# Patient Record
Sex: Female | Born: 1955 | Race: White | Hispanic: No | Marital: Married | Smoking: Former smoker
Health system: Southern US, Community
[De-identification: ages and names within clinical notes are randomized; demographics above are authoritative.]

## PROBLEM LIST (undated history)

## (undated) DIAGNOSIS — Z87442 Personal history of urinary calculi: Secondary | ICD-10-CM

## (undated) DIAGNOSIS — I499 Cardiac arrhythmia, unspecified: Secondary | ICD-10-CM

## (undated) DIAGNOSIS — I10 Essential (primary) hypertension: Secondary | ICD-10-CM

## (undated) DIAGNOSIS — K219 Gastro-esophageal reflux disease without esophagitis: Secondary | ICD-10-CM

## (undated) DIAGNOSIS — M1990 Unspecified osteoarthritis, unspecified site: Secondary | ICD-10-CM

## (undated) DIAGNOSIS — R519 Headache, unspecified: Secondary | ICD-10-CM

## (undated) DIAGNOSIS — R51 Headache: Secondary | ICD-10-CM

## (undated) DIAGNOSIS — R002 Palpitations: Secondary | ICD-10-CM

## (undated) DIAGNOSIS — F32A Depression, unspecified: Secondary | ICD-10-CM

## (undated) DIAGNOSIS — F329 Major depressive disorder, single episode, unspecified: Secondary | ICD-10-CM

## (undated) DIAGNOSIS — E119 Type 2 diabetes mellitus without complications: Secondary | ICD-10-CM

## (undated) HISTORY — PX: OTHER SURGICAL HISTORY: SHX169

---

## 2003-09-26 HISTORY — PX: FOOT SURGERY: SHX648

## 2005-06-26 ENCOUNTER — Ambulatory Visit: Payer: Self-pay | Admitting: Internal Medicine

## 2006-08-02 ENCOUNTER — Ambulatory Visit: Payer: Self-pay | Admitting: Internal Medicine

## 2007-08-13 ENCOUNTER — Ambulatory Visit: Payer: Self-pay | Admitting: Family Medicine

## 2008-02-03 ENCOUNTER — Ambulatory Visit: Payer: Self-pay | Admitting: Internal Medicine

## 2008-08-13 ENCOUNTER — Ambulatory Visit: Payer: Self-pay | Admitting: Internal Medicine

## 2009-08-16 ENCOUNTER — Ambulatory Visit: Payer: Self-pay | Admitting: Internal Medicine

## 2010-04-18 ENCOUNTER — Ambulatory Visit: Payer: Self-pay | Admitting: Obstetrics & Gynecology

## 2010-04-21 ENCOUNTER — Ambulatory Visit: Payer: Self-pay | Admitting: Obstetrics & Gynecology

## 2010-04-22 LAB — PATHOLOGY REPORT

## 2011-05-22 ENCOUNTER — Emergency Department: Payer: Self-pay | Admitting: Emergency Medicine

## 2011-07-06 ENCOUNTER — Ambulatory Visit: Payer: Self-pay | Admitting: Obstetrics & Gynecology

## 2011-11-07 ENCOUNTER — Ambulatory Visit: Payer: Self-pay

## 2011-12-25 ENCOUNTER — Emergency Department: Payer: Self-pay | Admitting: Emergency Medicine

## 2012-01-03 ENCOUNTER — Ambulatory Visit: Payer: Self-pay | Admitting: Internal Medicine

## 2012-06-13 ENCOUNTER — Ambulatory Visit: Payer: Self-pay | Admitting: Internal Medicine

## 2012-07-10 ENCOUNTER — Ambulatory Visit: Payer: Self-pay | Admitting: Internal Medicine

## 2013-07-03 ENCOUNTER — Ambulatory Visit: Payer: Self-pay | Admitting: Internal Medicine

## 2014-05-26 ENCOUNTER — Ambulatory Visit: Payer: Self-pay

## 2014-07-14 ENCOUNTER — Ambulatory Visit: Payer: Self-pay | Admitting: Internal Medicine

## 2014-07-23 ENCOUNTER — Emergency Department: Payer: Self-pay | Admitting: Emergency Medicine

## 2014-07-23 LAB — CBC
HCT: 38.8 % (ref 35.0–47.0)
HGB: 12.8 g/dL (ref 12.0–16.0)
MCH: 30.1 pg (ref 26.0–34.0)
MCHC: 33 g/dL (ref 32.0–36.0)
MCV: 91 fL (ref 80–100)
Platelet: 190 x10 3/mm 3 (ref 150–440)
RBC: 4.25 X10 6/mm 3 (ref 3.80–5.20)
RDW: 12.6 % (ref 11.5–14.5)
WBC: 8.2 x10 3/mm 3 (ref 3.6–11.0)

## 2014-07-23 LAB — COMPREHENSIVE METABOLIC PANEL WITH GFR
Albumin: 3.6 g/dL (ref 3.4–5.0)
Alkaline Phosphatase: 98 U/L
Anion Gap: 11 (ref 7–16)
BUN: 19 mg/dL — ABNORMAL HIGH (ref 7–18)
Bilirubin,Total: 0.4 mg/dL (ref 0.2–1.0)
Calcium, Total: 9 mg/dL (ref 8.5–10.1)
Chloride: 103 mmol/L (ref 98–107)
Co2: 28 mmol/L (ref 21–32)
Creatinine: 0.84 mg/dL (ref 0.60–1.30)
EGFR (African American): >60
EGFR (Non-African Amer.): >60
Glucose: 262 mg/dL — ABNORMAL HIGH (ref 65–99)
Osmolality: 294 (ref 275–301)
Potassium: 3.9 mmol/L (ref 3.5–5.1)
SGOT(AST): 89 U/L — ABNORMAL HIGH (ref 15–37)
SGPT (ALT): 84 U/L — ABNORMAL HIGH
Sodium: 142 mmol/L (ref 136–145)
Total Protein: 7.7 g/dL (ref 6.4–8.2)

## 2014-07-23 LAB — URINALYSIS, COMPLETE
Bacteria: NONE SEEN
Bilirubin,UR: NEGATIVE
Glucose,UR: >=500 mg/dL (ref 0–75)
Hyaline Cast: 2
Ketone: NEGATIVE
Nitrite: NEGATIVE
Ph: 5 (ref 4.5–8.0)
Protein: 100
RBC,UR: 449 /HPF (ref 0–5)
Specific Gravity: 1.021 (ref 1.003–1.030)
Squamous Epithelial: 2
WBC UR: 144 /HPF (ref 0–5)

## 2014-07-25 LAB — URINE CULTURE

## 2014-11-02 ENCOUNTER — Emergency Department: Payer: Self-pay | Admitting: Emergency Medicine

## 2015-02-01 DIAGNOSIS — R748 Abnormal levels of other serum enzymes: Secondary | ICD-10-CM | POA: Insufficient documentation

## 2015-02-24 HISTORY — PX: COLONOSCOPY: SHX174

## 2015-03-08 ENCOUNTER — Ambulatory Visit: Payer: BLUE CROSS/BLUE SHIELD | Admitting: Anesthesiology

## 2015-03-08 ENCOUNTER — Encounter
Admission: RE | Disposition: A | Discharge status: Home / Self Care | Payer: Self-pay | Source: Ambulatory Visit | Attending: Gastroenterology

## 2015-03-08 ENCOUNTER — Ambulatory Visit
Admission: RE | Admit: 2015-03-08 | Discharge: 2015-03-08 | Disposition: A | Discharge status: Home / Self Care | Payer: BLUE CROSS/BLUE SHIELD | Source: Ambulatory Visit | Attending: Gastroenterology | Admitting: Gastroenterology

## 2015-03-08 DIAGNOSIS — I10 Essential (primary) hypertension: Secondary | ICD-10-CM | POA: Insufficient documentation

## 2015-03-08 DIAGNOSIS — Z1211 Encounter for screening for malignant neoplasm of colon: Secondary | ICD-10-CM | POA: Diagnosis present

## 2015-03-08 DIAGNOSIS — E119 Type 2 diabetes mellitus without complications: Secondary | ICD-10-CM | POA: Diagnosis not present

## 2015-03-08 DIAGNOSIS — Z79899 Other long term (current) drug therapy: Secondary | ICD-10-CM | POA: Diagnosis not present

## 2015-03-08 DIAGNOSIS — F329 Major depressive disorder, single episode, unspecified: Secondary | ICD-10-CM | POA: Diagnosis not present

## 2015-03-08 DIAGNOSIS — R51 Headache: Secondary | ICD-10-CM | POA: Diagnosis not present

## 2015-03-08 DIAGNOSIS — Z87891 Personal history of nicotine dependence: Secondary | ICD-10-CM | POA: Diagnosis not present

## 2015-03-08 DIAGNOSIS — Z7982 Long term (current) use of aspirin: Secondary | ICD-10-CM | POA: Insufficient documentation

## 2015-03-08 HISTORY — DX: Headache, unspecified: R51.9

## 2015-03-08 HISTORY — DX: Depression, unspecified: F32.A

## 2015-03-08 HISTORY — DX: Type 2 diabetes mellitus without complications: E11.9

## 2015-03-08 HISTORY — DX: Headache: R51

## 2015-03-08 HISTORY — PX: COLONOSCOPY: SHX5424

## 2015-03-08 HISTORY — DX: Essential (primary) hypertension: I10

## 2015-03-08 HISTORY — DX: Major depressive disorder, single episode, unspecified: F32.9

## 2015-03-08 SURGERY — COLONOSCOPY
Anesthesia: General

## 2015-03-08 MED ORDER — MIDAZOLAM HCL 2 MG/2ML IJ SOLN
INTRAMUSCULAR | Status: DC | PRN
  Administered 2015-03-08: 2 mg via INTRAVENOUS

## 2015-03-08 MED ORDER — SODIUM CHLORIDE 0.9 % IV SOLN
INTRAVENOUS | Status: DC
  Administered 2015-03-08: 1000 mL via INTRAVENOUS

## 2015-03-08 MED ORDER — PROPOFOL INFUSION 10 MG/ML OPTIME
INTRAVENOUS | Status: DC | PRN
  Administered 2015-03-08: 140 ug/kg/min via INTRAVENOUS

## 2015-03-08 MED ORDER — SODIUM CHLORIDE 0.9 % IV SOLN: INTRAVENOUS | Status: DC

## 2015-03-08 MED ORDER — LIDOCAINE HCL (CARDIAC) 20 MG/ML IV SOLN
INTRAVENOUS | Status: DC | PRN
  Administered 2015-03-08: 80 mg via INTRAVENOUS

## 2015-03-08 NOTE — Op Note (Signed)
[facility]astroenterology Patient Name: Belinda Soto Procedure Date: 03/08/2015 10:23 AM MRN: [id number] Account #: [id number] Date of Birth: 06/22/1956 Admit Type: Outpatient Age: 59 Room: [facility] ENDO ROOM 4 Gender: Female Note Status: Finalized Procedure:         Colonoscopy Indications:       Screening for colorectal malignant neoplasm Providers:         Lupita Dawn. Candace Cruise, MD Referring MD:      Lavera Guise, MD (Referring MD) Medicines:         Monitored Anesthesia Care Complications:     No immediate complications. Procedure:         Pre-Anesthesia Assessment:                    - Prior to the procedure, a History and Physical was                     performed, and patient medications, allergies and                     sensitivities were reviewed. The patient's tolerance of                     previous anesthesia was reviewed.                    - The risks and benefits of the procedure and the sedation                     options and risks were discussed with the patient. All                     questions were answered and informed consent was obtained.                    - After reviewing the risks and benefits, the patient was                     deemed in satisfactory condition to undergo the procedure.                    After obtaining informed consent, the colonoscope was                     passed under direct vision. Throughout the procedure, the                     patient's blood pressure, pulse, and oxygen saturations                     were monitored continuously. The Colonoscope was                     introduced through the anus and advanced to the the cecum,                     identified by appendiceal orifice and ileocecal valve. The                     colonoscopy was performed without difficulty. The patient                     tolerated the procedure well. The quality of the bowel  preparation was good. Findings:      The  colon (entire examined portion) appeared normal. Impression:        - The entire examined colon is normal.                    - No specimens collected. Recommendation:    - Discharge patient to home.                    - Repeat colonoscopy in 10 years for surveillance.                    - The findings and recommendations were discussed with the                     patient. Procedure Code(s): --- Professional ---                    [id number], Colonoscopy, flexible; diagnostic, including                     collection of specimen(s) by brushing or washing, when                     performed (separate procedure) Diagnosis Code(s): --- Professional ---                    Z12.11, Encounter for screening for malignant neoplasm of                     colon CPT copyright 2014 American [employer]. All rights reserved. The codes documented in this report are preliminary and upon coder review may  be revised to meet current compliance requirements. Hulen Luster, MD 03/08/2015 10:51:29 AM This report has been signed electronically. Number of Addenda: 0 Note Initiated On: 03/08/2015 10:23 AM Scope Withdrawal Time: 0 hours 7 minutes 14 seconds  Total Procedure Duration: 0 hours 10 minutes 55 seconds       [facility]

## 2015-03-08 NOTE — Transfer of Care (Signed)
Immediate Anesthesia Transfer of Care Note  Patient: Belinda Soto  Procedure(s) Performed: Procedure(s): COLONOSCOPY (N/A)  Patient Location: PACU and Endoscopy Unit  Anesthesia Type:General  Level of Consciousness: sedated  Airway & Oxygen Therapy: Patient Spontanous Breathing and Patient connected to nasal cannula oxygen  Post-op Assessment: Report given to RN and Post -op Vital signs reviewed and stable  Post vital signs: Reviewed and stable  Last Vitals:  Filed Vitals:   03/08/15 1050  BP: 102/49  Pulse: 75  Temp: 35.6 C  Resp: 15    Complications: No apparent anesthesia complications

## 2015-03-08 NOTE — Anesthesia Preprocedure Evaluation (Signed)
Anesthesia Evaluation  Patient identified by MRN, date of birth, ID band Patient awake    Reviewed: Allergy & Precautions, H&P , NPO status , Patient's Chart, lab work & pertinent test results, reviewed documented beta blocker date and time   Airway Mallampati: II  TM Distance: >3 FB Neck ROM: full    Dental no notable dental hx.    Pulmonary neg pulmonary ROS, former smoker,  breath sounds clear to auscultation  Pulmonary exam normal       Cardiovascular Exercise Tolerance: Good hypertension, negative cardio ROS  Rhythm:regular Rate:Normal     Neuro/Psych  Headaches, negative neurological ROS  negative psych ROS   GI/Hepatic negative GI ROS, Neg liver ROS,   Endo/Other  negative endocrine ROSdiabetes  Renal/GU negative Renal ROS  negative genitourinary   Musculoskeletal   Abdominal   Peds  Hematology negative hematology ROS (+)   Anesthesia Other Findings   Reproductive/Obstetrics negative OB ROS                             Anesthesia Physical Anesthesia Plan  ASA: II  Anesthesia Plan: General   Post-op Pain Management:    Induction:   Airway Management Planned:   Additional Equipment:   Intra-op Plan:   Post-operative Plan:   Informed Consent: I have reviewed the patients History and Physical, chart, labs and discussed the procedure including the risks, benefits and alternatives for the proposed anesthesia with the patient or authorized representative who has indicated his/her understanding and acceptance.   Dental Advisory Given  Plan Discussed with: CRNA  Anesthesia Plan Comments:         Anesthesia Quick Evaluation

## 2015-03-08 NOTE — Anesthesia Postprocedure Evaluation (Signed)
  Anesthesia Post-op Note  Patient: Belinda Soto  Procedure(s) Performed: Procedure(s): COLONOSCOPY (N/A)  Anesthesia type:General  Patient location: PACU  Post pain: Pain level controlled  Post assessment: Post-op Vital signs reviewed, Patient's Cardiovascular Status Stable, Respiratory Function Stable, Patent Airway and No signs of Nausea or vomiting  Post vital signs: Reviewed and stable  Last Vitals:  Filed Vitals:   03/08/15 1050  BP: 102/49  Pulse: 75  Temp: 35.6 C  Resp: 15    Level of consciousness: awake, alert  and patient cooperative  Complications: No apparent anesthesia complications

## 2015-03-08 NOTE — H&P (Signed)
    Primary Care Physician:  Lavera Guise, MD Primary Gastroenterologist:  Dr. Candace Cruise  Pre-Procedure History & Physical: HPI:  Belinda Soto is a 59 y.o. female is here for an colonoscopy.   Past Medical History  Diagnosis Date  . Hypertension   . Diabetes mellitus without complication   . Headache   . Depression     Past Surgical History  Procedure Laterality Date  . C sections      Prior to Admission medications   Medication Sig Start Date End Date Taking? Authorizing Provider  aspirin 81 MG tablet Take 81 mg by mouth daily.   Yes Historical Provider, MD  bisoprolol-hydrochlorothiazide (ZIAC) 5-6.25 MG per tablet Take 1 tablet by mouth daily.   Yes Historical Provider, MD  celecoxib (CELEBREX) 200 MG capsule Take 200 mg by mouth daily.   Yes Historical Provider, MD  citalopram (CELEXA) 20 MG tablet Take 20 mg by mouth daily.   Yes Historical Provider, MD  ferrous sulfate 325 (65 FE) MG tablet Take 325 mg by mouth daily with breakfast.   Yes Historical Provider, MD  gabapentin (NEURONTIN) 100 MG capsule Take 100 mg by mouth 3 (three) times daily as needed (pain).   Yes Historical Provider, MD  glimepiride (AMARYL) 2 MG tablet Take 4 mg by mouth 2 (two) times daily.   Yes Historical Provider, MD  hydrochlorothiazide (MICROZIDE) 12.5 MG capsule Take 12.5 mg by mouth daily.   Yes Historical Provider, MD  Liraglutide (VICTOZA) 18 MG/3ML SOPN Inject 18 mg into the skin daily.   Yes Historical Provider, MD  metFORMIN (GLUCOPHAGE) 500 MG tablet Take 1,000 mg by mouth 2 (two) times daily.   Yes Historical Provider, MD  valsartan (DIOVAN) 320 MG tablet Take 320 mg by mouth daily.   Yes Historical Provider, MD    Allergies as of 02/02/2015  . (Not on File)    History reviewed. No pertinent family history.  History   Social History  . Marital Status: Married    Spouse Name: N/A  . Number of Children: N/A  . Years of Education: N/A   Occupational History  . Not on file.    Social History Main Topics  . Smoking status: Former Research scientist (life sciences)  . Smokeless tobacco: Not on file  . Alcohol Use: Not on file  . Drug Use: Not on file  . Sexual Activity: Not on file   Other Topics Concern  . Not on file   Social History Narrative    Review of Systems: See HPI, otherwise negative ROS  Physical Exam: There were no vitals taken for this visit. General:   Alert,  pleasant and cooperative in NAD Head:  Normocephalic and atraumatic. Neck:  Supple; no masses or thyromegaly. Lungs:  Clear throughout to auscultation.    Heart:  Regular rate and rhythm. Abdomen:  Soft, nontender and nondistended. Normal bowel sounds, without guarding, and without rebound.   Neurologic:  Alert and  oriented x4;  grossly normal neurologically.  Impression/Plan: Belinda Soto is here for a colonoscopy to be performed for screening.  Risks, benefits, limitations, and alternatives regarding colonoscopy have been reviewed with the patient.  Questions have been answered.  All parties agreeable.   Keilyn Haggard, Lupita Dawn, MD  03/08/2015, 9:49 AM

## 2015-04-29 ENCOUNTER — Encounter: Payer: Self-pay | Admitting: Gastroenterology

## 2015-07-22 ENCOUNTER — Other Ambulatory Visit: Payer: Self-pay | Admitting: Internal Medicine

## 2015-07-22 DIAGNOSIS — Z1231 Encounter for screening mammogram for malignant neoplasm of breast: Secondary | ICD-10-CM

## 2015-07-27 ENCOUNTER — Ambulatory Visit
Admission: RE | Admit: 2015-07-27 | Discharge: 2015-07-27 | Disposition: A | Discharge status: Home / Self Care | Payer: BLUE CROSS/BLUE SHIELD | Source: Ambulatory Visit | Attending: Internal Medicine | Admitting: Internal Medicine

## 2015-07-27 DIAGNOSIS — Z1231 Encounter for screening mammogram for malignant neoplasm of breast: Secondary | ICD-10-CM | POA: Diagnosis not present

## 2015-12-09 ENCOUNTER — Ambulatory Visit (INDEPENDENT_AMBULATORY_CARE_PROVIDER_SITE_OTHER): Payer: BLUE CROSS/BLUE SHIELD | Admitting: Internal Medicine

## 2015-12-09 ENCOUNTER — Encounter: Payer: Self-pay | Admitting: Internal Medicine

## 2015-12-09 VITALS — BP 138/90 | HR 73 | Ht 65.0 in | Wt 167.4 lb

## 2015-12-09 DIAGNOSIS — I998 Other disorder of circulatory system: Secondary | ICD-10-CM | POA: Diagnosis not present

## 2015-12-09 MED ORDER — METOPROLOL TARTRATE 50 MG PO TABS: 50.0000 mg | ORAL_TABLET | Freq: Two times a day (BID) | ORAL | Status: DC

## 2015-12-09 NOTE — Patient Instructions (Signed)
Medication Instructions:  Your physician has recommended you make the following change in your medication:  INCREASE metoprolol to 50mg  twice daily    Labwork: none  Testing/Procedures: none  Follow-Up: Your physician recommends that you schedule a follow-up appointment as needed   Any Other Special Instructions Will Be Listed Below (If Applicable).     If you need a refill on your cardiac medications before your next appointment, please call your pharmacy.

## 2015-12-09 NOTE — Progress Notes (Signed)
ELECTROPHYSIOLOGY CONSULT NOTE  Patient ID: Belinda Soto, MRN: [id number], DOB/AGE: [birth year] 58 y.o. Admit date: (Not on file) Date of Consult: 12/09/2015  Primary Physician: Lavera Guise, MD Primary Cardiologist: new Consulting Physician FK  Chief Complaint: Palpitations   HPI Belinda Soto is a 60 y.o. female  Referred for palpitations. This has been going on 2-3 years but has been more problematic over the last 2-3 months.  They're characterized as "skips" or "extra beats." Worsening over the last couple of months derive from increasing frequency of these events. She often feels them in her throat. There is some lightheadedness. She has not noted problems with exercise tolerance or peripheral edema.  Reports included in the referral packaging included an echo with normal LV function some diastolic dysfunction and mild left atrial enlargement. In this context it is notable that she has significant greater than 3 drug hypertension she has daytime somnolence although she denies sleep apnea  Her blood pressure medications were changed; palpitations have been somewhat improved. I assume that the change was the addition of the bisoprolol.  She underwent stress testing, there is a written comment regarding extra beats; strips Were not obtained  She also had a Holter monitor which demonstrated PACs and nonsustained atrial tachycardia up to 5 beats. These comprise about 2% of her total beats; no PVCs were noted  She denies syncope  She does not use caffeine  She has diabetes; her last hemoglobin A1c was greater than 7. Laboratories were also notable for hypertension hyper transaminitis anemia      Past Medical History  Diagnosis Date  . Hypertension   . Diabetes mellitus without complication ([place])   . Headache   . Depression       Surgical History:  Past Surgical History  Procedure Laterality Date  . C sections    . Colonoscopy N/A 03/08/2015    Procedure:  COLONOSCOPY;  Surgeon: Hulen Luster, MD;  Location: [facility] ENDOSCOPY;  Service: Gastroenterology;  Laterality: N/A;     Home Meds: Prior to Admission medications   Medication Sig Start Date End Date Taking? Authorizing Provider  aspirin 81 MG tablet Take 81 mg by mouth daily.   Yes Historical Provider, MD  bisoprolol-hydrochlorothiazide (ZIAC) 5-6.25 MG per tablet Take 1 tablet by mouth daily.   Yes Historical Provider, MD  celecoxib (CELEBREX) 200 MG capsule Take 200 mg by mouth daily.   Yes Historical Provider, MD  citalopram (CELEXA) 20 MG tablet Take 20 mg by mouth daily.   Yes Historical Provider, MD  ferrous sulfate 325 (65 FE) MG tablet Take 325 mg by mouth daily with breakfast.   Yes Historical Provider, MD  gabapentin (NEURONTIN) 100 MG capsule Take 100 mg by mouth 3 (three) times daily as needed (pain).   Yes Historical Provider, MD  glimepiride (AMARYL) 2 MG tablet Take 4 mg by mouth 2 (two) times daily.   Yes Historical Provider, MD  hydrochlorothiazide (MICROZIDE) 12.5 MG capsule Take 12.5 mg by mouth daily.   Yes Historical Provider, MD  Liraglutide (VICTOZA) 18 MG/3ML SOPN Inject 18 mg into the skin daily.   Yes Historical Provider, MD  metFORMIN (GLUCOPHAGE) 500 MG tablet Take 1,000 mg by mouth 2 (two) times daily.   Yes Historical Provider, MD  metoprolol tartrate (LOPRESSOR) 25 MG tablet Take 25 mg by mouth 2 (two) times daily. 12/09/15  Yes Historical Provider, MD  valsartan (DIOVAN) 320 MG tablet Take 320 mg by mouth daily.  Yes Historical Provider, MD    Allergies:  Allergies  Allergen Reactions  . Codeine Nausea And Vomiting  . Morphine And Related Itching  . Sulfur Swelling    Social History   Social History  . Marital Status: Married    Spouse Name: N/A  . Number of Children: N/A  . Years of Education: N/A   Occupational History  . Not on file.   Social History Main Topics  . Smoking status: Former Research scientist (life sciences)  . Smokeless tobacco: Not on file  . Alcohol Use:  Not on file  . Drug Use: Not on file  . Sexual Activity: Not on file   Other Topics Concern  . Not on file   Social History Narrative     Family History  Problem Relation Age of Onset  . Breast cancer Neg Hx      ROS:  Please see the history of present illness.     All other systems reviewed and negative.    Physical Exam:   Blood pressure 138/90, pulse 73, height 5\' 5"  (1.651 m), weight 167 lb 6.4 oz (75.932 kg). General: Well developed, well nourished female in no acute distress. Head: Normocephalic, atraumatic, sclera non-icteric, no xanthomas, nares are without discharge. EENT: normal  Lymph Nodes:  none Neck: Negative for carotid bruits. JVD not elevated. Back:without scoliosis kyphosis  Lungs: Clear bilaterally to auscultation without wheezes, rales, or rhonchi. Breathing is unlabored. Heart: RRR with S1 S2. No  murmur . No rubs, or gallops appreciated. Abdomen: Soft, non-tender, non-distended with normoactive bowel sounds. No hepatomegaly. No rebound/guarding. No obvious abdominal masses. Msk:  Strength and tone appear normal for age. Extremities: No clubbing or cyanosis. No edema.  Distal pedal pulses are 2+ and equal bilaterally. Skin: Warm and Dry Neuro: Alert and oriented X 3. CN III-XII intact Grossly normal sensory and motor function . Psych:  Responds to questions appropriately with a normal affect.      Labs: Cardiac Enzymes No results for input(s): CKTOTAL, CKMB, TROPONINI in the last 72 hours. CBC Lab Results  Component Value Date   WBC 8.2 07/23/2014   HGB 12.8 07/23/2014   HCT 38.8 07/23/2014   MCV 91 07/23/2014   PLT 190 07/23/2014   PROTIME: No results for input(s): LABPROT, INR in the last 72 hours. Chemistry No results for input(s): NA, K, CL, CO2, BUN, CREATININE, CALCIUM, PROT, BILITOT, ALKPHOS, ALT, AST, GLUCOSE in the last 168 hours.  Invalid input(s): LABALBU Lipids No results found for: CHOL, HDL, LDLCALC, TRIG BNP No results found  for: PROBNP Thyroid Function Tests: No results for input(s): TSH, T4TOTAL, T3FREE, THYROIDAB in the last 72 hours.  Invalid input(s): FREET3 Miscellaneous No results found for: DDIMER  Radiology/Studies:  No results found.  EKG: sinus   Holter monitor was obtained. As above  Assessment and Plan:  Palpitations-PACs and nonsustained atrial tachycardia  Hypertension  Diabetes  Sleep disordered breathing   The patient has symptomatic PACs. I have referred her that these are benign. In the context of her hypertension and her mild left atrial enlargement I'm concerned about the potential or atrial fibrillation and I have encouraged her to be aggressive with her blood pressure management and also have explained the relationship of sleep apnea and atrial fibrillation. With her daytime somnolence and her hypertension I have encouraged her to follow up with Dr. Humphrey Rolls concerning a heart sleep study  We will increase her metoprolol from 25 twice a day--50 twice a day.  Given the ACCORD  trial, target blood pressures would be in the 130 range>> UTDate ---based upon data from goal blood pressure trials in diabetic patients, plus indirect data from SPRINT (that included patients who, like those with diabetes, have a high cardiovascular risk) [39], we suggest a goal systolic pressure of 123456 to 125 mmHg in patients with diabetes if AOBP is used to measure blood pressure, or a systolic pressure of 0000000 to 130 mmHg if manual ausculatory blood pressure is used, rather than a goal systolic pressure of less than 140 mmHg  In this regard I thought that chlorthalidone (preferred to hydrochlorothiazide) might be better since her blood pressures of been elevated since the discontinuation of the diuretic      Virl Axe

## 2016-01-13 ENCOUNTER — Emergency Department: Payer: Worker's Compensation

## 2016-01-13 ENCOUNTER — Emergency Department
Admission: EM | Admit: 2016-01-13 | Discharge: 2016-01-13 | Disposition: A | Discharge status: Home / Self Care | Payer: Worker's Compensation | Attending: Emergency Medicine | Admitting: Emergency Medicine

## 2016-01-13 ENCOUNTER — Encounter: Payer: Self-pay | Admitting: Emergency Medicine

## 2016-01-13 DIAGNOSIS — F329 Major depressive disorder, single episode, unspecified: Secondary | ICD-10-CM | POA: Diagnosis not present

## 2016-01-13 DIAGNOSIS — Z7984 Long term (current) use of oral hypoglycemic drugs: Secondary | ICD-10-CM | POA: Diagnosis not present

## 2016-01-13 DIAGNOSIS — I10 Essential (primary) hypertension: Secondary | ICD-10-CM | POA: Insufficient documentation

## 2016-01-13 DIAGNOSIS — Z79899 Other long term (current) drug therapy: Secondary | ICD-10-CM | POA: Diagnosis not present

## 2016-01-13 DIAGNOSIS — Y939 Activity, unspecified: Secondary | ICD-10-CM | POA: Diagnosis not present

## 2016-01-13 DIAGNOSIS — E119 Type 2 diabetes mellitus without complications: Secondary | ICD-10-CM | POA: Diagnosis not present

## 2016-01-13 DIAGNOSIS — S0990XA Unspecified injury of head, initial encounter: Secondary | ICD-10-CM

## 2016-01-13 DIAGNOSIS — S01511A Laceration without foreign body of lip, initial encounter: Secondary | ICD-10-CM | POA: Diagnosis not present

## 2016-01-13 DIAGNOSIS — R42 Dizziness and giddiness: Secondary | ICD-10-CM | POA: Diagnosis not present

## 2016-01-13 DIAGNOSIS — M542 Cervicalgia: Secondary | ICD-10-CM | POA: Insufficient documentation

## 2016-01-13 DIAGNOSIS — W208XXA Other cause of strike by thrown, projected or falling object, initial encounter: Secondary | ICD-10-CM | POA: Diagnosis not present

## 2016-01-13 DIAGNOSIS — Z7982 Long term (current) use of aspirin: Secondary | ICD-10-CM | POA: Insufficient documentation

## 2016-01-13 DIAGNOSIS — M545 Low back pain: Secondary | ICD-10-CM | POA: Diagnosis not present

## 2016-01-13 DIAGNOSIS — Y999 Unspecified external cause status: Secondary | ICD-10-CM | POA: Diagnosis not present

## 2016-01-13 DIAGNOSIS — R51 Headache: Secondary | ICD-10-CM | POA: Insufficient documentation

## 2016-01-13 DIAGNOSIS — Z87891 Personal history of nicotine dependence: Secondary | ICD-10-CM | POA: Insufficient documentation

## 2016-01-13 DIAGNOSIS — Y929 Unspecified place or not applicable: Secondary | ICD-10-CM | POA: Insufficient documentation

## 2016-01-13 MED ORDER — MELOXICAM 15 MG PO TABS: 15.0000 mg | ORAL_TABLET | Freq: Every day | ORAL | Status: DC

## 2016-01-13 MED ORDER — BACLOFEN 10 MG PO TABS: 10.0000 mg | ORAL_TABLET | Freq: Three times a day (TID) | ORAL | Status: DC

## 2016-01-13 NOTE — Discharge Instructions (Signed)
Concussion, Adult  A concussion, or closed-head injury, is a brain injury caused by a direct blow to the head or by a quick and sudden movement (jolt) of the head or neck. Concussions are usually not life-threatening. Even so, the effects of a concussion can be serious. If you have had a concussion before, you are more likely to experience concussion-like symptoms after a direct blow to the head.   CAUSES  · Direct blow to the head, such as from running into another player during a soccer game, being hit in a fight, or hitting your head on a hard surface.  · A jolt of the head or neck that causes the brain to move back and forth inside the skull, such as in a car crash.  SIGNS AND SYMPTOMS  The signs of a concussion can be hard to notice. Early on, they may be missed by you, family members, and health care providers. You may look fine but act or feel differently.  Symptoms are usually temporary, but they may last for days, weeks, or even longer. Some symptoms may appear right away while others may not show up for hours or days. Every head injury is different. Symptoms include:  · Mild to moderate headaches that will not go away.  · A feeling of pressure inside your head.  · Having more trouble than usual:    Learning or remembering things you have heard.    Answering questions.    Paying attention or concentrating.    Organizing daily tasks.    Making decisions and solving problems.  · Slowness in thinking, acting or reacting, speaking, or reading.  · Getting lost or being easily confused.  · Feeling tired all the time or lacking energy (fatigued).  · Feeling drowsy.  · Sleep disturbances.    Sleeping more than usual.    Sleeping less than usual.    Trouble falling asleep.    Trouble sleeping (insomnia).  · Loss of balance or feeling lightheaded or dizzy.  · Nausea or vomiting.  · Numbness or tingling.  · Increased sensitivity to:    Sounds.    Lights.    Distractions.  · Vision problems or eyes that tire  easily.  · Diminished sense of taste or smell.  · Ringing in the ears.  · Mood changes such as feeling sad or anxious.  · Becoming easily irritated or angry for little or no reason.  · Lack of motivation.  · Seeing or hearing things other people do not see or hear (hallucinations).  DIAGNOSIS  Your health care provider can usually diagnose a concussion based on a description of your injury and symptoms. He or she will ask whether you passed out (lost consciousness) and whether you are having trouble remembering events that happened right before and during your injury.  Your evaluation might include:  · A brain scan to look for signs of injury to the brain. Even if the test shows no injury, you may still have a concussion.  · Blood tests to be sure other problems are not present.  TREATMENT  · Concussions are usually treated in an emergency department, in urgent care, or at a clinic. You may need to stay in the hospital overnight for further treatment.  · Tell your health care provider if you are taking any medicines, including prescription medicines, over-the-counter medicines, and natural remedies. Some medicines, such as blood thinners (anticoagulants) and aspirin, may increase the chance of complications. Also tell your health care   provider whether you have had alcohol or are taking illegal drugs. This information may affect treatment.  · Your health care provider will send you home with important instructions to follow.  · How fast you will recover from a concussion depends on many factors. These factors include how severe your concussion is, what part of your brain was injured, your age, and how healthy you were before the concussion.  · Most people with mild injuries recover fully. Recovery can take time. In general, recovery is slower in older persons. Also, persons who have had a concussion in the past or have other medical problems may find that it takes longer to recover from their current injury.  HOME  CARE INSTRUCTIONS  General Instructions  · Carefully follow the directions your health care provider gave you.  · Only take over-the-counter or prescription medicines for pain, discomfort, or fever as directed by your health care provider.  · Take only those medicines that your health care provider has approved.  · Do not drink alcohol until your health care provider says you are well enough to do so. Alcohol and certain other drugs may slow your recovery and can put you at risk of further injury.  · If it is harder than usual to remember things, write them down.  · If you are easily distracted, try to do one thing at a time. For example, do not try to watch TV while fixing dinner.  · Talk with family members or close friends when making important decisions.  · Keep all follow-up appointments. Repeated evaluation of your symptoms is recommended for your recovery.  · Watch your symptoms and tell others to do the same. Complications sometimes occur after a concussion. Older adults with a brain injury may have a higher risk of serious complications, such as a blood clot on the brain.  · Tell your teachers, school nurse, school counselor, coach, athletic trainer, or work manager about your injury, symptoms, and restrictions. Tell them about what you can or cannot do. They should watch for:    Increased problems with attention or concentration.    Increased difficulty remembering or learning new information.    Increased time needed to complete tasks or assignments.    Increased irritability or decreased ability to cope with stress.    Increased symptoms.  · Rest. Rest helps the brain to heal. Make sure you:    Get plenty of sleep at night. Avoid staying up late at night.    Keep the same bedtime hours on weekends and weekdays.    Rest during the day. Take daytime naps or rest breaks when you feel tired.  · Limit activities that require a lot of thought or concentration. These include:    Doing homework or job-related  work.    Watching TV.    Working on the computer.  · Avoid any situation where there is potential for another head injury (football, hockey, soccer, basketball, martial arts, downhill snow sports and horseback riding). Your condition will get worse every time you experience a concussion. You should avoid these activities until you are evaluated by the appropriate follow-up health care providers.  Returning To Your Regular Activities  You will need to return to your normal activities slowly, not all at once. You must give your body and brain enough time for recovery.  · Do not return to sports or other athletic activities until your health care provider tells you it is safe to do so.  · Ask   your health care provider when you can drive, ride a bicycle, or operate heavy machinery. Your ability to react may be slower after a brain injury. Never do these activities if you are dizzy.  · Ask your health care provider about when you can return to work or school.  Preventing Another Concussion  It is very important to avoid another brain injury, especially before you have recovered. In rare cases, another injury can lead to permanent brain damage, brain swelling, or death. The risk of this is greatest during the first 7-10 days after a head injury. Avoid injuries by:  · Wearing a seat belt when riding in a car.  · Drinking alcohol only in moderation.  · Wearing a helmet when biking, skiing, skateboarding, skating, or doing similar activities.  · Avoiding activities that could lead to a second concussion, such as contact or recreational sports, until your health care provider says it is okay.  · Taking safety measures in your home.    Remove clutter and tripping hazards from floors and stairways.    Use grab bars in bathrooms and handrails by stairs.    Place non-slip mats on floors and in bathtubs.    Improve lighting in dim areas.  SEEK MEDICAL CARE IF:  · You have increased problems paying attention or  concentrating.  · You have increased difficulty remembering or learning new information.  · You need more time to complete tasks or assignments than before.  · You have increased irritability or decreased ability to cope with stress.  · You have more symptoms than before.  Seek medical care if you have any of the following symptoms for more than 2 weeks after your injury:  · Lasting (chronic) headaches.  · Dizziness or balance problems.  · Nausea.  · Vision problems.  · Increased sensitivity to noise or light.  · Depression or mood swings.  · Anxiety or irritability.  · Memory problems.  · Difficulty concentrating or paying attention.  · Sleep problems.  · Feeling tired all the time.  SEEK IMMEDIATE MEDICAL CARE IF:  · You have severe or worsening headaches. These may be a sign of a blood clot in the brain.  · You have weakness (even if only in one hand, leg, or part of the face).  · You have numbness.  · You have decreased coordination.  · You vomit repeatedly.  · You have increased sleepiness.  · One pupil is larger than the other.  · You have convulsions.  · You have slurred speech.  · You have increased confusion. This may be a sign of a blood clot in the brain.  · You have increased restlessness, agitation, or irritability.  · You are unable to recognize people or places.  · You have neck pain.  · It is difficult to wake you up.  · You have unusual behavior changes.  · You lose consciousness.  MAKE SURE YOU:  · Understand these instructions.  · Will watch your condition.  · Will get help right away if you are not doing well or get worse.     This information is not intended to replace advice given to you by your health care provider. Make sure you discuss any questions you have with your health care provider.     Document Released: 12/02/2003 Document Revised: 10/02/2014 Document Reviewed: 04/03/2013  Elsevier Interactive Patient Education ©2016 [employer]

## 2016-01-13 NOTE — ED Provider Notes (Signed)
CSN: [id number]     Arrival date & time 01/13/16  1007 History   First MD Initiated Contact with Patient 01/13/16 1047     Chief Complaint  Patient presents with  . Laceration  . Back Pain     HPI   60 year old female who presents to the emergency department for evaluation after a large box fell on her and hit her face and lip this morning. She states that for about 5 minutes after it hit her, she had a severe diffuse headache. Now, with certain movements of her head, she feels "woozy." She is also having some pain on the right side of her neck and across her lower back. She has not taken anything for pain.  Past Medical History  Diagnosis Date  . Hypertension   . Diabetes mellitus without complication ([place])   . Headache   . Depression    Past Surgical History  Procedure Laterality Date  . C sections    . Colonoscopy N/A 03/08/2015    Procedure: COLONOSCOPY;  Surgeon: Hulen Luster, MD;  Location: [facility] ENDOSCOPY;  Service: Gastroenterology;  Laterality: N/A;   Family History  Problem Relation Age of Onset  . Breast cancer Neg Hx    Social History  Substance Use Topics  . Smoking status: Former Research scientist (life sciences)  . Smokeless tobacco: None  . Alcohol Use: Yes     Comment: occas.    OB History    No data available     Review of Systems  Constitutional: Negative.   HENT:       Negative for malocclusion  Gastrointestinal: Negative for nausea and vomiting.  Musculoskeletal: Positive for back pain and neck pain.  Skin: Positive for wound.  Neurological: Positive for dizziness, light-headedness and headaches. Negative for syncope and weakness.  Psychiatric/Behavioral: Negative for confusion.      Allergies  Codeine; Morphine and related; and Sulfur  Home Medications   Prior to Admission medications   Medication Sig Start Date End Date Taking? Authorizing Provider  aspirin 81 MG tablet Take 81 mg by mouth daily.    Historical Provider, MD  bisoprolol-hydrochlorothiazide [facility])  5-6.25 MG per tablet Take 1 tablet by mouth daily.    Historical Provider, MD  celecoxib (CELEBREX) 200 MG capsule Take 200 mg by mouth daily.    Historical Provider, MD  citalopram (CELEXA) 20 MG tablet Take 20 mg by mouth daily.    Historical Provider, MD  ferrous sulfate 325 (65 FE) MG tablet Take 325 mg by mouth daily with breakfast.    Historical Provider, MD  gabapentin (NEURONTIN) 100 MG capsule Take 100 mg by mouth 3 (three) times daily as needed (pain).    Historical Provider, MD  glimepiride (AMARYL) 2 MG tablet Take 4 mg by mouth 2 (two) times daily.    Historical Provider, MD  hydrochlorothiazide (MICROZIDE) 12.5 MG capsule Take 12.5 mg by mouth daily.    Historical Provider, MD  Liraglutide (VICTOZA) 18 MG/3ML SOPN Inject 18 mg into the skin daily.    Historical Provider, MD  metFORMIN (GLUCOPHAGE) 500 MG tablet Take 1,000 mg by mouth 2 (two) times daily.    Historical Provider, MD  metoprolol tartrate (LOPRESSOR) 50 MG tablet Take 1 tablet (50 mg total) by mouth 2 (two) times daily. 12/09/15   Deboraha Sprang, MD  valsartan (DIOVAN) 320 MG tablet Take 320 mg by mouth daily.    Historical Provider, MD   BP 172/96 mmHg  Pulse 69  Temp(Src) 98 F (36.7  C) (Oral)  Resp 16  Ht 5\' 5"  (1.651 m)  Wt 77.111 kg  BMI 28.29 kg/m2  SpO2 100% Physical Exam  Constitutional: She is oriented to person, place, and time. She appears well-developed.  HENT:  Mouth/Throat:    Pulmonary/Chest: Effort normal.  Musculoskeletal: Normal range of motion.  Neurological: She is alert and oriented to person, place, and time. No cranial nerve deficit. Coordination normal.  Skin: Skin is warm and dry.  Psychiatric: She has a normal mood and affect. Her behavior is normal. Judgment and thought content normal.  Nursing note and vitals reviewed.   ED Course  Procedures (including critical care time) Labs Review Labs Reviewed - No data to display  Imaging Review No results found. I have personally  reviewed and evaluated these images and lab results as part of my medical decision-making.   EKG Interpretation None      MDM   Final diagnoses:  None    CT results discussed with patient. Strict return precautions were discussed as well. She is to follow-up with her primary care provider for her neck and back pain if it does not resolve over the week. She was given prescriptions for baclofen and meloxicam.    Victorino Dike, FNP 01/13/16 1226  Delman Kitten, MD 01/13/16 1610

## 2016-01-13 NOTE — ED Notes (Signed)
Pt completed W/C and urine drug screen; pt completed COC form and was given her copy and employers copy; urine sample and COC hand delivered to lab

## 2016-01-13 NOTE — ED Notes (Signed)
Pt was on the job, states she had a large box fall on her from top of dolly, hit her face/lip, small lac to upper lip noted. States she had an "explosive" headache for about 5 min after box hit her face, lower back pain shortly after as well. Now states she feels "woozy" like she has taken muscle relaxers.

## 2016-01-20 DIAGNOSIS — I10 Essential (primary) hypertension: Secondary | ICD-10-CM | POA: Diagnosis not present

## 2016-01-20 DIAGNOSIS — E1140 Type 2 diabetes mellitus with diabetic neuropathy, unspecified: Secondary | ICD-10-CM | POA: Diagnosis not present

## 2016-01-20 DIAGNOSIS — R002 Palpitations: Secondary | ICD-10-CM | POA: Diagnosis not present

## 2016-02-02 DIAGNOSIS — E119 Type 2 diabetes mellitus without complications: Secondary | ICD-10-CM | POA: Diagnosis not present

## 2016-02-14 DIAGNOSIS — I10 Essential (primary) hypertension: Secondary | ICD-10-CM | POA: Diagnosis not present

## 2016-02-14 DIAGNOSIS — E1140 Type 2 diabetes mellitus with diabetic neuropathy, unspecified: Secondary | ICD-10-CM | POA: Diagnosis not present

## 2016-02-14 DIAGNOSIS — R51 Headache: Secondary | ICD-10-CM | POA: Diagnosis not present

## 2016-03-01 ENCOUNTER — Emergency Department
Admission: EM | Admit: 2016-03-01 | Discharge: 2016-03-01 | Disposition: A | Discharge status: Home / Self Care | Payer: BLUE CROSS/BLUE SHIELD | Attending: Emergency Medicine | Admitting: Emergency Medicine

## 2016-03-01 DIAGNOSIS — Z79899 Other long term (current) drug therapy: Secondary | ICD-10-CM | POA: Insufficient documentation

## 2016-03-01 DIAGNOSIS — Y999 Unspecified external cause status: Secondary | ICD-10-CM | POA: Insufficient documentation

## 2016-03-01 DIAGNOSIS — E119 Type 2 diabetes mellitus without complications: Secondary | ICD-10-CM | POA: Insufficient documentation

## 2016-03-01 DIAGNOSIS — Y939 Activity, unspecified: Secondary | ICD-10-CM | POA: Diagnosis not present

## 2016-03-01 DIAGNOSIS — Y92410 Unspecified street and highway as the place of occurrence of the external cause: Secondary | ICD-10-CM | POA: Diagnosis not present

## 2016-03-01 DIAGNOSIS — F329 Major depressive disorder, single episode, unspecified: Secondary | ICD-10-CM | POA: Diagnosis not present

## 2016-03-01 DIAGNOSIS — Z7982 Long term (current) use of aspirin: Secondary | ICD-10-CM | POA: Diagnosis not present

## 2016-03-01 DIAGNOSIS — Z87891 Personal history of nicotine dependence: Secondary | ICD-10-CM | POA: Insufficient documentation

## 2016-03-01 DIAGNOSIS — M542 Cervicalgia: Secondary | ICD-10-CM | POA: Diagnosis present

## 2016-03-01 DIAGNOSIS — I10 Essential (primary) hypertension: Secondary | ICD-10-CM | POA: Diagnosis not present

## 2016-03-01 DIAGNOSIS — S239XXA Sprain of unspecified parts of thorax, initial encounter: Secondary | ICD-10-CM

## 2016-03-01 DIAGNOSIS — Z7984 Long term (current) use of oral hypoglycemic drugs: Secondary | ICD-10-CM | POA: Diagnosis not present

## 2016-03-01 DIAGNOSIS — S233XXA Sprain of ligaments of thoracic spine, initial encounter: Secondary | ICD-10-CM | POA: Diagnosis not present

## 2016-03-01 DIAGNOSIS — S161XXA Strain of muscle, fascia and tendon at neck level, initial encounter: Secondary | ICD-10-CM | POA: Insufficient documentation

## 2016-03-01 MED ORDER — KETOROLAC TROMETHAMINE 10 MG PO TABS: 10.0000 mg | ORAL_TABLET | Freq: Four times a day (QID) | ORAL | Status: DC | PRN

## 2016-03-01 MED ORDER — KETOROLAC TROMETHAMINE 60 MG/2ML IM SOLN
60.0000 mg | Freq: Once | INTRAMUSCULAR | Status: AC
  Administered 2016-03-01: 60 mg via INTRAMUSCULAR
  Filled 2016-03-01: qty 2

## 2016-03-01 MED ORDER — METHOCARBAMOL 750 MG PO TABS: 750.0000 mg | ORAL_TABLET | Freq: Four times a day (QID) | ORAL | Status: DC

## 2016-03-01 MED ORDER — OXYCODONE-ACETAMINOPHEN 7.5-325 MG PO TABS: 1.0000 | ORAL_TABLET | ORAL | Status: AC | PRN

## 2016-03-01 NOTE — ED Notes (Signed)
E sig pad not working, pt verbalized understanding 
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
## 2016-03-01 NOTE — Discharge Instructions (Signed)
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
## 2016-04-03 DIAGNOSIS — M549 Dorsalgia, unspecified: Secondary | ICD-10-CM | POA: Diagnosis not present

## 2016-04-03 DIAGNOSIS — R51 Headache: Secondary | ICD-10-CM | POA: Diagnosis not present

## 2016-04-03 DIAGNOSIS — R002 Palpitations: Secondary | ICD-10-CM | POA: Diagnosis not present

## 2016-04-03 DIAGNOSIS — E1165 Type 2 diabetes mellitus with hyperglycemia: Secondary | ICD-10-CM | POA: Diagnosis not present

## 2016-04-06 DIAGNOSIS — M4727 Other spondylosis with radiculopathy, lumbosacral region: Secondary | ICD-10-CM | POA: Diagnosis not present

## 2016-04-06 DIAGNOSIS — M545 Low back pain: Secondary | ICD-10-CM | POA: Diagnosis not present

## 2016-04-06 DIAGNOSIS — S39012A Strain of muscle, fascia and tendon of lower back, initial encounter: Secondary | ICD-10-CM | POA: Diagnosis not present

## 2016-04-12 DIAGNOSIS — M4726 Other spondylosis with radiculopathy, lumbar region: Secondary | ICD-10-CM | POA: Diagnosis not present

## 2016-04-19 DIAGNOSIS — M4726 Other spondylosis with radiculopathy, lumbar region: Secondary | ICD-10-CM | POA: Diagnosis not present

## 2016-04-21 DIAGNOSIS — M4726 Other spondylosis with radiculopathy, lumbar region: Secondary | ICD-10-CM | POA: Diagnosis not present

## 2016-04-26 DIAGNOSIS — M4726 Other spondylosis with radiculopathy, lumbar region: Secondary | ICD-10-CM | POA: Diagnosis not present

## 2016-05-19 DIAGNOSIS — N390 Urinary tract infection, site not specified: Secondary | ICD-10-CM | POA: Diagnosis not present

## 2016-05-19 DIAGNOSIS — E1165 Type 2 diabetes mellitus with hyperglycemia: Secondary | ICD-10-CM | POA: Diagnosis not present

## 2016-05-19 DIAGNOSIS — B373 Candidiasis of vulva and vagina: Secondary | ICD-10-CM | POA: Diagnosis not present

## 2016-05-19 DIAGNOSIS — I10 Essential (primary) hypertension: Secondary | ICD-10-CM | POA: Diagnosis not present

## 2016-06-01 DIAGNOSIS — J209 Acute bronchitis, unspecified: Secondary | ICD-10-CM | POA: Diagnosis not present

## 2016-06-15 IMAGING — MG MM DIGITAL SCREENING BILAT W/ CAD
5 series · 5 of 5 positions shown · non-contrast
Comparison: Previous exam(s).

CLINICAL DATA: Screening.

EXAM:
DIGITAL SCREENING BILATERAL MAMMOGRAM WITH CAD

[L MLO (1 of 2)]
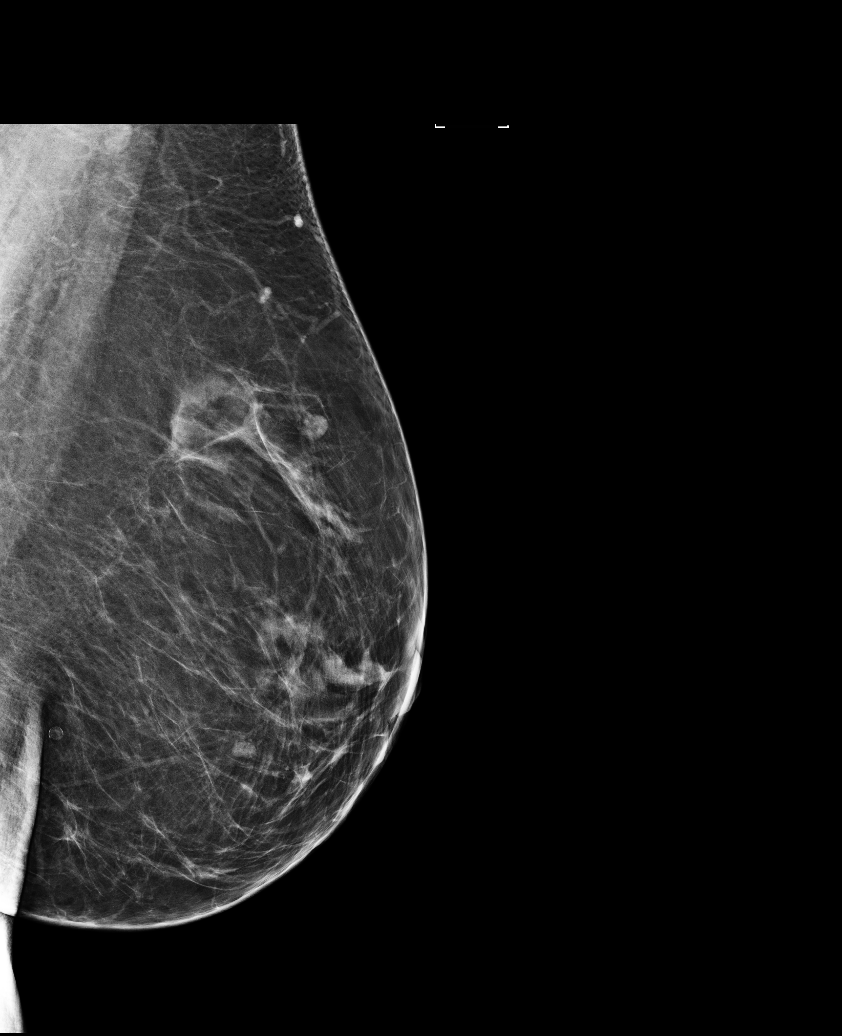

[R MLO]
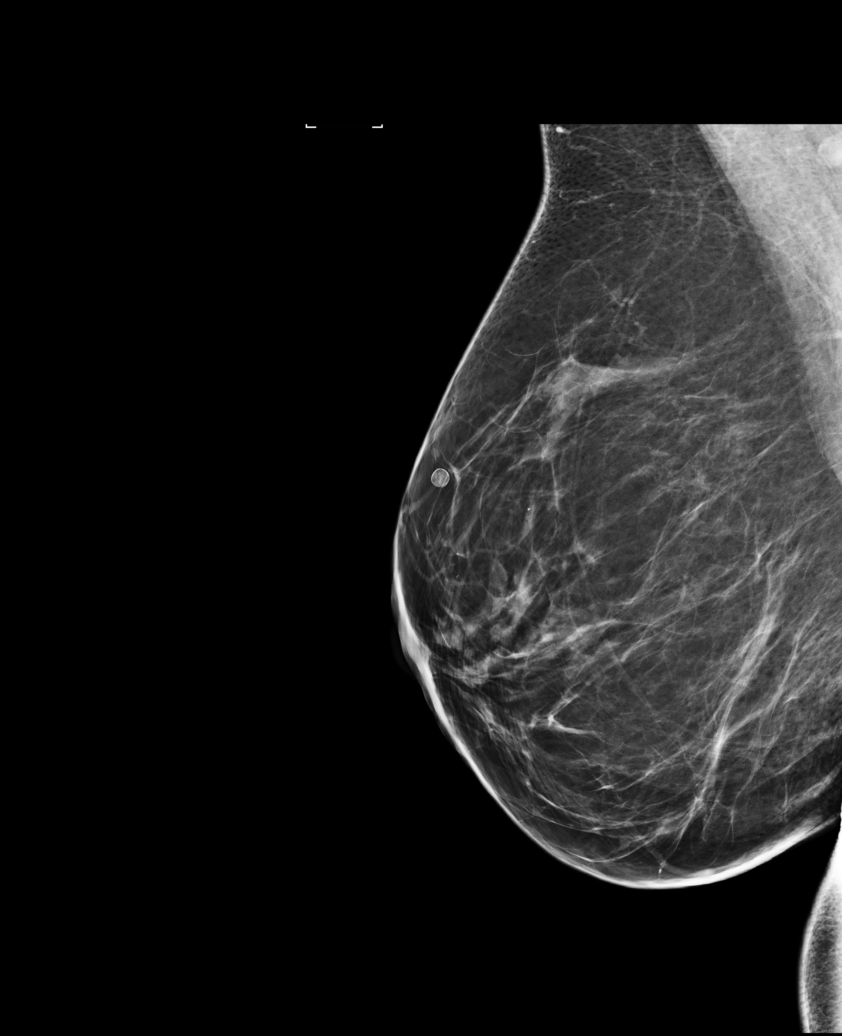

[R CC]
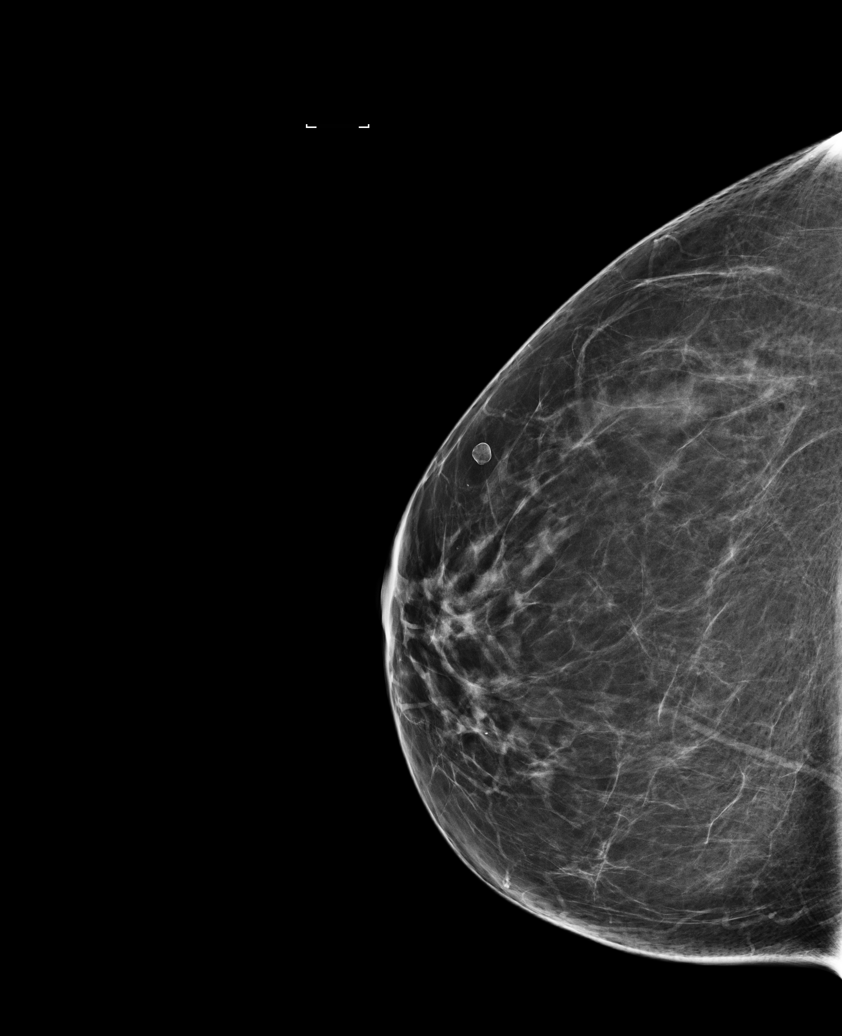

[L MLO (2 of 2)]
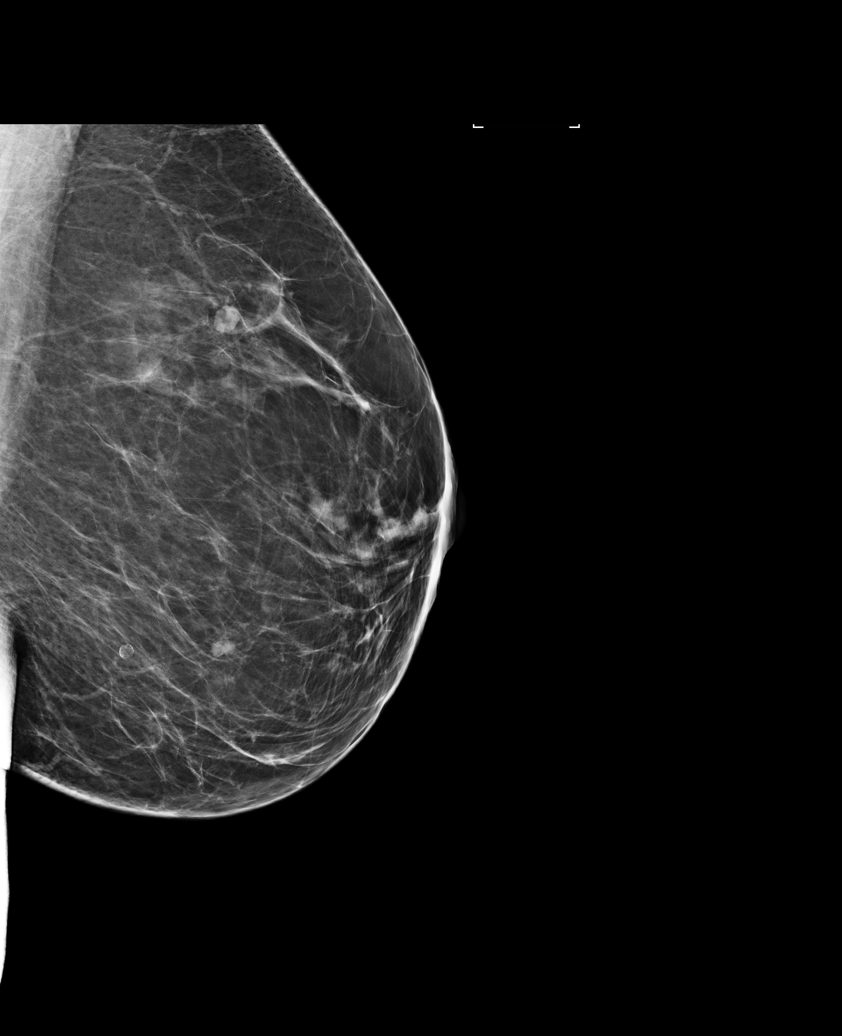

[L CC]
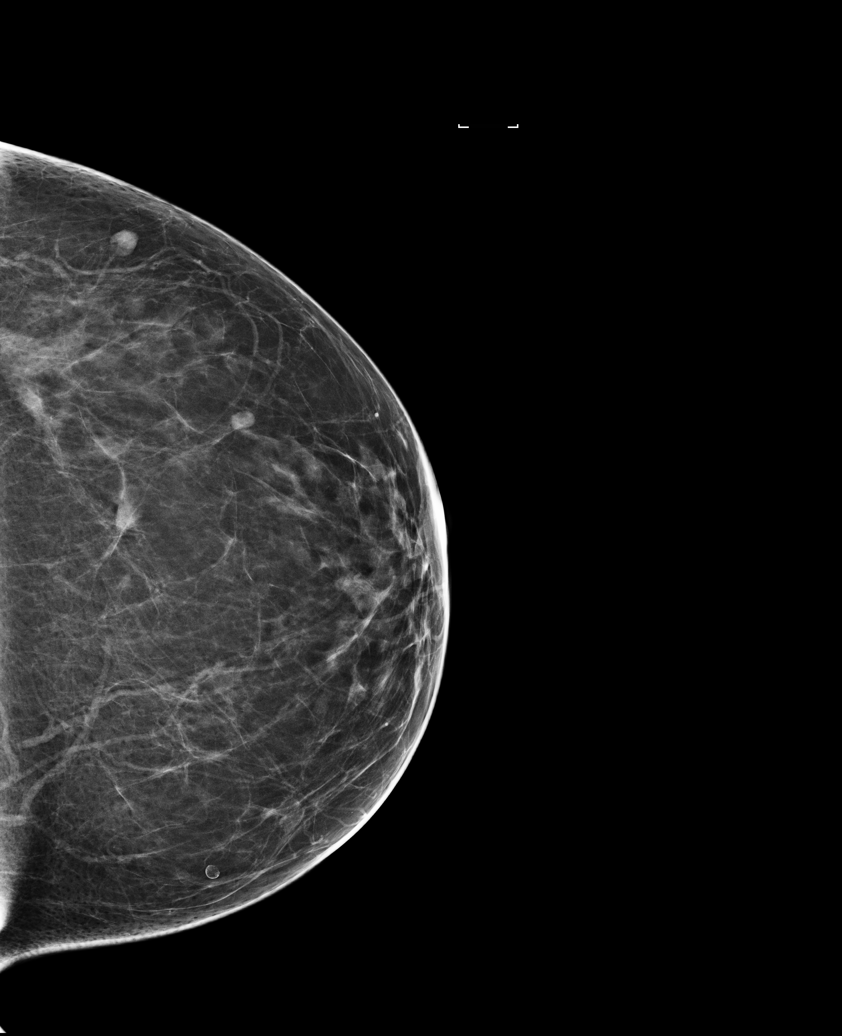

[5 of 5 positions shown; findings below may reference images not displayed]

ACR Breast Density Category b: There are scattered areas of
fibroglandular density.
FINDINGS: There are no findings suspicious for malignancy. Images were
processed with CAD.
IMPRESSION: No mammographic evidence of malignancy. A result letter of this
screening mammogram will be mailed directly to the patient.

RECOMMENDATION:
Screening mammogram in one year. (Code:AS-G-LCT)

BI-RADS CATEGORY  1: Negative.

## 2016-06-29 DIAGNOSIS — Z23 Encounter for immunization: Secondary | ICD-10-CM | POA: Diagnosis not present

## 2016-07-10 DIAGNOSIS — E1140 Type 2 diabetes mellitus with diabetic neuropathy, unspecified: Secondary | ICD-10-CM | POA: Diagnosis not present

## 2016-07-10 DIAGNOSIS — R51 Headache: Secondary | ICD-10-CM | POA: Diagnosis not present

## 2016-07-10 DIAGNOSIS — I10 Essential (primary) hypertension: Secondary | ICD-10-CM | POA: Diagnosis not present

## 2016-07-10 DIAGNOSIS — F339 Major depressive disorder, recurrent, unspecified: Secondary | ICD-10-CM | POA: Diagnosis not present

## 2016-07-18 ENCOUNTER — Other Ambulatory Visit: Payer: Self-pay | Admitting: Internal Medicine

## 2016-07-18 DIAGNOSIS — Z1231 Encounter for screening mammogram for malignant neoplasm of breast: Secondary | ICD-10-CM

## 2016-08-02 ENCOUNTER — Ambulatory Visit
Admission: RE | Admit: 2016-08-02 | Discharge: 2016-08-02 | Disposition: A | Discharge status: Home / Self Care | Payer: BLUE CROSS/BLUE SHIELD | Source: Ambulatory Visit | Attending: Internal Medicine | Admitting: Internal Medicine

## 2016-08-02 DIAGNOSIS — R221 Localized swelling, mass and lump, neck: Secondary | ICD-10-CM | POA: Diagnosis not present

## 2016-08-02 DIAGNOSIS — Z1231 Encounter for screening mammogram for malignant neoplasm of breast: Secondary | ICD-10-CM

## 2016-10-10 DIAGNOSIS — R221 Localized swelling, mass and lump, neck: Secondary | ICD-10-CM | POA: Diagnosis not present

## 2016-10-10 DIAGNOSIS — F339 Major depressive disorder, recurrent, unspecified: Secondary | ICD-10-CM | POA: Diagnosis not present

## 2016-10-10 DIAGNOSIS — I10 Essential (primary) hypertension: Secondary | ICD-10-CM | POA: Diagnosis not present

## 2016-10-10 DIAGNOSIS — E1140 Type 2 diabetes mellitus with diabetic neuropathy, unspecified: Secondary | ICD-10-CM | POA: Diagnosis not present

## 2016-10-24 DIAGNOSIS — E1165 Type 2 diabetes mellitus with hyperglycemia: Secondary | ICD-10-CM | POA: Diagnosis not present

## 2016-10-24 DIAGNOSIS — I10 Essential (primary) hypertension: Secondary | ICD-10-CM | POA: Diagnosis not present

## 2016-10-24 DIAGNOSIS — Z0001 Encounter for general adult medical examination with abnormal findings: Secondary | ICD-10-CM | POA: Diagnosis not present

## 2016-10-24 DIAGNOSIS — E559 Vitamin D deficiency, unspecified: Secondary | ICD-10-CM | POA: Diagnosis not present

## 2017-01-17 DIAGNOSIS — E1140 Type 2 diabetes mellitus with diabetic neuropathy, unspecified: Secondary | ICD-10-CM | POA: Diagnosis not present

## 2017-01-17 DIAGNOSIS — M25559 Pain in unspecified hip: Secondary | ICD-10-CM | POA: Diagnosis not present

## 2017-01-17 DIAGNOSIS — I10 Essential (primary) hypertension: Secondary | ICD-10-CM | POA: Diagnosis not present

## 2017-01-17 DIAGNOSIS — L209 Atopic dermatitis, unspecified: Secondary | ICD-10-CM | POA: Diagnosis not present

## 2017-02-21 DIAGNOSIS — M064 Inflammatory polyarthropathy: Secondary | ICD-10-CM | POA: Diagnosis not present

## 2017-04-12 DIAGNOSIS — I10 Essential (primary) hypertension: Secondary | ICD-10-CM | POA: Diagnosis not present

## 2017-04-12 DIAGNOSIS — F339 Major depressive disorder, recurrent, unspecified: Secondary | ICD-10-CM | POA: Diagnosis not present

## 2017-04-12 DIAGNOSIS — E1140 Type 2 diabetes mellitus with diabetic neuropathy, unspecified: Secondary | ICD-10-CM | POA: Diagnosis not present

## 2017-05-24 DIAGNOSIS — F339 Major depressive disorder, recurrent, unspecified: Secondary | ICD-10-CM | POA: Diagnosis not present

## 2017-05-24 DIAGNOSIS — I10 Essential (primary) hypertension: Secondary | ICD-10-CM | POA: Diagnosis not present

## 2017-05-24 DIAGNOSIS — E1140 Type 2 diabetes mellitus with diabetic neuropathy, unspecified: Secondary | ICD-10-CM | POA: Diagnosis not present

## 2017-06-28 DIAGNOSIS — Z23 Encounter for immunization: Secondary | ICD-10-CM | POA: Diagnosis not present

## 2017-07-23 DIAGNOSIS — K602 Anal fissure, unspecified: Secondary | ICD-10-CM | POA: Diagnosis not present

## 2017-07-23 DIAGNOSIS — S46092A Other injury of muscle(s) and tendon(s) of the rotator cuff of left shoulder, initial encounter: Secondary | ICD-10-CM | POA: Diagnosis not present

## 2017-07-23 DIAGNOSIS — E1140 Type 2 diabetes mellitus with diabetic neuropathy, unspecified: Secondary | ICD-10-CM | POA: Diagnosis not present

## 2017-07-23 DIAGNOSIS — K648 Other hemorrhoids: Secondary | ICD-10-CM | POA: Diagnosis not present

## 2017-07-26 ENCOUNTER — Ambulatory Visit: Payer: Self-pay | Admitting: General Surgery

## 2017-07-26 ENCOUNTER — Encounter: Payer: Self-pay | Admitting: *Deleted

## 2017-07-26 ENCOUNTER — Other Ambulatory Visit: Payer: Self-pay | Admitting: Nurse Practitioner

## 2017-07-26 DIAGNOSIS — Z1231 Encounter for screening mammogram for malignant neoplasm of breast: Secondary | ICD-10-CM

## 2017-07-27 ENCOUNTER — Other Ambulatory Visit: Payer: Self-pay | Admitting: Nurse Practitioner

## 2017-07-27 ENCOUNTER — Ambulatory Visit
Admission: RE | Admit: 2017-07-27 | Discharge: 2017-07-27 | Disposition: A | Discharge status: Home / Self Care | Payer: BLUE CROSS/BLUE SHIELD | Source: Ambulatory Visit | Attending: Nurse Practitioner | Admitting: Nurse Practitioner

## 2017-07-27 DIAGNOSIS — R52 Pain, unspecified: Secondary | ICD-10-CM

## 2017-07-27 DIAGNOSIS — M19012 Primary osteoarthritis, left shoulder: Secondary | ICD-10-CM | POA: Insufficient documentation

## 2017-07-27 DIAGNOSIS — M25512 Pain in left shoulder: Secondary | ICD-10-CM | POA: Diagnosis present

## 2017-07-30 ENCOUNTER — Ambulatory Visit (INDEPENDENT_AMBULATORY_CARE_PROVIDER_SITE_OTHER): Payer: BLUE CROSS/BLUE SHIELD | Admitting: General Surgery

## 2017-07-30 ENCOUNTER — Encounter: Payer: Self-pay | Admitting: General Surgery

## 2017-07-30 VITALS — BP 130/74 | HR 72 | Resp 12 | Ht 65.0 in | Wt 171.0 lb

## 2017-07-30 DIAGNOSIS — K602 Anal fissure, unspecified: Secondary | ICD-10-CM

## 2017-07-30 MED ORDER — DIBUCAINE 1 % RE OINT: 1.0000 "application " | TOPICAL_OINTMENT | RECTAL | 0 refills | Status: DC | PRN

## 2017-07-30 NOTE — Progress Notes (Signed)
Patient ID: Belinda Soto, female   DOB: [birth year], 61 y.o.   MRN: [id number]  Chief Complaint  Patient presents with  . Other    hemorrhoids    HPI Belinda Soto is a 61 y.o. female here for evaluation of pain with bowel movements. She reports bleeding when she wipes and when she has a bowel movement. Admits pain in the rectum when she moves her bowels but no other time. She states that this has been ongoing for the past month. She moves her bowel every 2-3 days. Had a colonoscopy in 2016. Denies drainage.  HPI  Past Medical History:  Diagnosis Date  . Depression   . Diabetes mellitus without complication ([place])   . Headache   . Hypertension     Past Surgical History:  Procedure Laterality Date  . c sections    . COLONOSCOPY  02/2015    Family History  Problem Relation Age of Onset  . Breast cancer Neg Hx     Social History Social History   Tobacco Use  . Smoking status: Former Research scientist (life sciences)  . Smokeless tobacco: Never Used  Substance Use Topics  . Alcohol use: Yes    Comment: occas.   . Drug use: No    Allergies  Allergen Reactions  . Codeine Nausea And Vomiting  . Morphine And Related Itching  . Sulfur Swelling    Current Outpatient Medications  Medication Sig Dispense Refill  . amLODipine (NORVASC) 2.5 MG tablet Take 2.5 mg daily by mouth.    Marland Kitchen aspirin 81 MG tablet Take 81 mg by mouth daily.    . baclofen (LIORESAL) 10 MG tablet Take 1 tablet (10 mg total) by mouth 3 (three) times daily. 30 tablet 0  . celecoxib (CELEBREX) 200 MG capsule Take 200 mg by mouth daily.    . Cholecalciferol (VITAMIN D PO) Take by mouth.    . citalopram (CELEXA) 20 MG tablet Take 20 mg by mouth daily.    . ferrous sulfate 325 (65 FE) MG tablet Take 325 mg by mouth daily with breakfast.    . gabapentin (NEURONTIN) 100 MG capsule Take 100 mg by mouth 3 (three) times daily as needed (pain).    Marland Kitchen glimepiride (AMARYL) 2 MG tablet Take 4 mg by mouth 2 (two) times daily.    .  hydrochlorothiazide (MICROZIDE) 12.5 MG capsule Take 12.5 mg by mouth daily.    . Liraglutide (VICTOZA) 18 MG/3ML SOPN Inject 18 mg into the skin daily.    . metFORMIN (GLUCOPHAGE) 500 MG tablet Take 1,000 mg by mouth 2 (two) times daily.    . methocarbamol (ROBAXIN-750) 750 MG tablet Take 1 tablet (750 mg total) by mouth 4 (four) times daily. 20 tablet 0  . metoprolol tartrate (LOPRESSOR) 50 MG tablet Take 1 tablet (50 mg total) by mouth 2 (two) times daily. (Patient taking differently: Take 100 mg 2 (two) times daily by mouth. ) 60 tablet 11  . valsartan (DIOVAN) 320 MG tablet Take 320 mg by mouth daily.    . dibucaine (NUPERCAINAL) 1 % OINT Place 1 application as needed rectally for hemorrhoids. 28 g 0   No current facility-administered medications for this visit.     Review of Systems Review of Systems  Constitutional: Negative.   Respiratory: Negative.   Cardiovascular: Negative.   Gastrointestinal: Positive for blood in stool and rectal pain. Negative for abdominal distention, abdominal pain, anal bleeding, constipation, diarrhea, nausea and vomiting.    Blood pressure 130/74, pulse 72,  resp. rate 12, height 5\' 5"  (1.651 m), weight 171 lb (77.6 kg).  Physical Exam Physical Exam  Constitutional: She is oriented to person, place, and time. She appears well-developed and well-nourished.  Eyes: Conjunctivae are normal. No scleral icterus.  Neck: Neck supple.  Cardiovascular: Normal rate, regular rhythm and normal heart sounds.  Pulmonary/Chest: Effort normal and breath sounds normal.  Abdominal: Soft. Bowel sounds are normal. There is no tenderness.  Genitourinary: Rectal exam shows fissure (small posterior) and anal tone abnormal (increased).     Lymphadenopathy:    She has no cervical adenopathy.  Neurological: She is alert and oriented to person, place, and time.  Skin: Skin is warm and dry.  Psychiatric: She has a normal mood and affect.    Data  Reviewed Notes  Assessment    Small posterior anal fissure with increased sphincter tone. No signs of infection. Recommend surgical intervention. Discussed procedure risks and benefits with patient. She consented to proceed with surgery.    Plan    Can use topical nupercainal ointment prn for pain.  Schedule surgical repair of anal fissure.     HPI, Physical Exam, Assessment and Plan have been scribed under the direction and in the presence of [name], MD  [name], LPN   I have completed the exam and reviewed the above documentation for accuracy and completeness.  I agree with the above.  Haematologist has been used and any errors in dictation or transcription are unintentional.  Seeplaputhur G. Jamal Collin, M.D., F.A.C.S.  Junie Panning G 07/31/2017, 3:01 PM  Patient's surgery has been scheduled for 08-24-17 at [facility]. It is okay for patient to continue an 81 mg aspirin once daily.    Dominga Ferry, CMA

## 2017-07-30 NOTE — Patient Instructions (Addendum)
Anal Fissure, Adult An anal fissure is a small tear or crack in the skin around the opening of the butt (anus).Bleeding from the tear or crack usually stops on its own within a few minutes. The bleeding may happen every time you poop (have a bowel movement) until the tear or crack heals. Follow these instructions at home: Eating and drinking  Avoid bananas and dairy products. These foods can make it hard to poop.  Drink enough fluid to keep your pee (urine) clear or pale yellow.  Eat a lot of fruit, whole grains, and vegetables. General instructions  Keep the butt area as clean and dry as you can.  Take a warm water bath (sitz bath) as told by your doctor. Do not use soap.  Take over-the-counter and prescription medicines only as told by your doctor.  Use creams or ointments only as told by your doctor.  Keep all follow-up visits as told by your doctor. This is important. Contact a doctor if:  You have more bleeding.  You have a fever.  You have watery poop (diarrhea) that is mixed with blood.  You have pain.  You problem gets worse, not better. This information is not intended to replace advice given to you by your health care provider. Make sure you discuss any questions you have with your health care provider. Document Released: 05/10/2011 Document Revised: 02/17/2016 Document Reviewed: 12/07/2014 Elsevier Interactive Patient Education  2018 [employer].   Use Nupercainal ointment as needed for pain.

## 2017-07-30 NOTE — H&P (View-Only) (Signed)
Patient ID: Belinda Soto, female   DOB: [birth year], 61 y.o.   MRN: [id number]  Chief Complaint  Patient presents with  . Other    hemorrhoids    HPI Belinda Soto is a 61 y.o. female here for evaluation of pain with bowel movements. She reports bleeding when she wipes and when she has a bowel movement. Admits pain in the rectum when she moves her bowels but no other time. She states that this has been ongoing for the past month. She moves her bowel every 2-3 days. Had a colonoscopy in 2016. Denies drainage.  HPI  Past Medical History:  Diagnosis Date  . Depression   . Diabetes mellitus without complication ([place])   . Headache   . Hypertension     Past Surgical History:  Procedure Laterality Date  . c sections    . COLONOSCOPY  02/2015    Family History  Problem Relation Age of Onset  . Breast cancer Neg Hx     Social History Social History   Tobacco Use  . Smoking status: Former Research scientist (life sciences)  . Smokeless tobacco: Never Used  Substance Use Topics  . Alcohol use: Yes    Comment: occas.   . Drug use: No    Allergies  Allergen Reactions  . Codeine Nausea And Vomiting  . Morphine And Related Itching  . Sulfur Swelling    Current Outpatient Medications  Medication Sig Dispense Refill  . amLODipine (NORVASC) 2.5 MG tablet Take 2.5 mg daily by mouth.    Marland Kitchen aspirin 81 MG tablet Take 81 mg by mouth daily.    . baclofen (LIORESAL) 10 MG tablet Take 1 tablet (10 mg total) by mouth 3 (three) times daily. 30 tablet 0  . celecoxib (CELEBREX) 200 MG capsule Take 200 mg by mouth daily.    . Cholecalciferol (VITAMIN D PO) Take by mouth.    . citalopram (CELEXA) 20 MG tablet Take 20 mg by mouth daily.    . ferrous sulfate 325 (65 FE) MG tablet Take 325 mg by mouth daily with breakfast.    . gabapentin (NEURONTIN) 100 MG capsule Take 100 mg by mouth 3 (three) times daily as needed (pain).    Marland Kitchen glimepiride (AMARYL) 2 MG tablet Take 4 mg by mouth 2 (two) times daily.    .  hydrochlorothiazide (MICROZIDE) 12.5 MG capsule Take 12.5 mg by mouth daily.    . Liraglutide (VICTOZA) 18 MG/3ML SOPN Inject 18 mg into the skin daily.    . metFORMIN (GLUCOPHAGE) 500 MG tablet Take 1,000 mg by mouth 2 (two) times daily.    . methocarbamol (ROBAXIN-750) 750 MG tablet Take 1 tablet (750 mg total) by mouth 4 (four) times daily. 20 tablet 0  . metoprolol tartrate (LOPRESSOR) 50 MG tablet Take 1 tablet (50 mg total) by mouth 2 (two) times daily. (Patient taking differently: Take 100 mg 2 (two) times daily by mouth. ) 60 tablet 11  . valsartan (DIOVAN) 320 MG tablet Take 320 mg by mouth daily.    . dibucaine (NUPERCAINAL) 1 % OINT Place 1 application as needed rectally for hemorrhoids. 28 g 0   No current facility-administered medications for this visit.     Review of Systems Review of Systems  Constitutional: Negative.   Respiratory: Negative.   Cardiovascular: Negative.   Gastrointestinal: Positive for blood in stool and rectal pain. Negative for abdominal distention, abdominal pain, anal bleeding, constipation, diarrhea, nausea and vomiting.    Blood pressure 130/74, pulse 72,  resp. rate 12, height 5\' 5"  (1.651 m), weight 171 lb (77.6 kg).  Physical Exam Physical Exam  Constitutional: She is oriented to person, place, and time. She appears well-developed and well-nourished.  Eyes: Conjunctivae are normal. No scleral icterus.  Neck: Neck supple.  Cardiovascular: Normal rate, regular rhythm and normal heart sounds.  Pulmonary/Chest: Effort normal and breath sounds normal.  Abdominal: Soft. Bowel sounds are normal. There is no tenderness.  Genitourinary: Rectal exam shows fissure (small posterior) and anal tone abnormal (increased).     Lymphadenopathy:    She has no cervical adenopathy.  Neurological: She is alert and oriented to person, place, and time.  Skin: Skin is warm and dry.  Psychiatric: She has a normal mood and affect.    Data  Reviewed Notes  Assessment    Small posterior anal fissure with increased sphincter tone. No signs of infection. Recommend surgical intervention. Discussed procedure risks and benefits with patient. She consented to proceed with surgery.    Plan    Can use topical nupercainal ointment prn for pain.  Schedule surgical repair of anal fissure.     HPI, Physical Exam, Assessment and Plan have been scribed under the direction and in the presence of [name], MD  Belinda Living, LPN   I have completed the exam and reviewed the above documentation for accuracy and completeness.  I agree with the above.  Belinda Soto has been used and any errors in dictation or transcription are unintentional.  Belinda Soto G. Jamal Collin, M.D., F.A.C.S.  Junie Panning G 07/31/2017, 3:01 PM  Patient's surgery has been scheduled for 08-24-17 at [facility]. It is okay for patient to continue an 81 mg aspirin once daily.    Dominga Ferry, CMA

## 2017-08-09 ENCOUNTER — Ambulatory Visit
Admission: RE | Admit: 2017-08-09 | Discharge: 2017-08-09 | Disposition: A | Discharge status: Home / Self Care | Payer: BLUE CROSS/BLUE SHIELD | Source: Ambulatory Visit | Attending: Nurse Practitioner | Admitting: Nurse Practitioner

## 2017-08-09 DIAGNOSIS — Z1231 Encounter for screening mammogram for malignant neoplasm of breast: Secondary | ICD-10-CM | POA: Insufficient documentation

## 2017-08-15 ENCOUNTER — Inpatient Hospital Stay: Admission: RE | Admit: 2017-08-15 | Payer: Self-pay | Source: Ambulatory Visit

## 2017-08-20 ENCOUNTER — Other Ambulatory Visit: Payer: Self-pay

## 2017-08-20 ENCOUNTER — Encounter
Admission: RE | Admit: 2017-08-20 | Discharge: 2017-08-20 | Disposition: A | Discharge status: Home / Self Care | Payer: BLUE CROSS/BLUE SHIELD | Source: Ambulatory Visit | Attending: General Surgery | Admitting: General Surgery

## 2017-08-20 HISTORY — DX: Unspecified osteoarthritis, unspecified site: M19.90

## 2017-08-20 HISTORY — DX: Palpitations: R00.2

## 2017-08-20 HISTORY — DX: Personal history of urinary calculi: Z87.442

## 2017-08-20 HISTORY — DX: Gastro-esophageal reflux disease without esophagitis: K21.9

## 2017-08-20 NOTE — Patient Instructions (Addendum)
Your procedure is scheduled on: 08-24-17 FRIDAY Report to Same Day Surgery 2nd floor medical mall [facility] Entrance-take elevator on left to 2nd floor.  Check in with surgery information desk.) To find out your arrival time please call [phone] between 1PM - 3PM on 08-23-17  Remember: Instructions that are not followed completely may result in serious medical risk, up to and including death, or upon the discretion of your surgeon and anesthesiologist your surgery may need to be rescheduled.    _x___ 1. Do not eat food after midnight the night before your procedure. NO GUM CHEWING OR CANDY AFTER MIDNIGHT.  You may drink WATER up to 2 hours before you are scheduled to arrive at the hospital for your procedure.  Do not drink WATER within 2 hours of your scheduled arrival to the hospital.  Type 1 and type 2 diabetics should only drink water.     __x__ 2. No Alcohol for 24 hours before or after surgery.   __x__3. No Smoking for 24 prior to surgery.   ____  4. Bring all medications with you on the day of surgery if instructed.    __x__ 5. Notify your doctor if there is any change in your medical condition     (cold, fever, infections).     Do not wear jewelry, make-up, hairpins, clips or nail polish.  Do not wear lotions, powders, or perfumes. You may wear deodorant.  Do not shave 48 hours prior to surgery. Men may shave face and neck.  Do not bring valuables to the hospital.    [facility] is not responsible for any belongings or valuables.               Contacts, dentures or bridgework may not be worn into surgery.  Leave your suitcase in the car. After surgery it may be brought to your room.  For patients admitted to the hospital, discharge time is determined by your treatment team.   Patients discharged the day of surgery will not be allowed to drive home.  You will need someone to drive you home and stay with you the night of your procedure.    Please read over the  following fact sheets that you were given:   [facility] Preparing for Surgery and or MRSA Information   _x___ TAKE THE FOLLOWING MEDICATIONS THE MORNING OF SURGERY WITH A SMALL SIP OF WATER. These include:  1. AMLODIPINE  2. CITALOPRAM  3. METOPROLOL  4. GABAPENTIN  5.  6.  _X___Fleets enema  as directed-DO FLEET ENEMA AT HOME 1 HOUR PRIOR TO ARRIVAL TIME TO HOSPITAL   ____ Use CHG Soap or sage wipes as directed on instruction sheet   ____ Use inhalers on the day of surgery and bring to hospital day of surgery  _X___ Stop Metformin and Janumet 2 days prior to surgery-LAST DOSE OF METFORMIN ON Tuesday, November 27TH   _x___ Take 1/2 of usual insulin dose the night before surgery and none on the morning surgery-TAKE HALF OF YOUR LEVEMIR ON Thursday NIGHT AND NO INSULIN AM OF SURGERY  _X___ Follow recommendations from Cardiologist, Pulmonologist or PCP regarding stopping Aspirin, Coumadin, Plavix ,Eliquis, Effient, or Pradaxa, and Pletal-OK TO CONTINUE 81 MG ASPIRIN-DO NOT TAKE AM OF SURGERY  X____Stop Anti-inflammatories such as Advil, Aleve, Ibuprofen, Motrin, Naproxen, MELOXICAM,  Naprosyn, Goodies powders or aspirin products NOW-OK to take Tylenol    ____ Stop supplements until after surgery   ____ Bring C-Pap to the hospital.

## 2017-08-21 ENCOUNTER — Encounter
Admission: RE | Admit: 2017-08-21 | Discharge: 2017-08-21 | Disposition: A | Discharge status: Home / Self Care | Payer: BLUE CROSS/BLUE SHIELD | Source: Ambulatory Visit | Attending: General Surgery | Admitting: General Surgery

## 2017-08-21 DIAGNOSIS — K602 Anal fissure, unspecified: Secondary | ICD-10-CM | POA: Diagnosis not present

## 2017-08-21 DIAGNOSIS — Z87891 Personal history of nicotine dependence: Secondary | ICD-10-CM | POA: Diagnosis not present

## 2017-08-21 DIAGNOSIS — Z7982 Long term (current) use of aspirin: Secondary | ICD-10-CM | POA: Diagnosis not present

## 2017-08-21 DIAGNOSIS — M1990 Unspecified osteoarthritis, unspecified site: Secondary | ICD-10-CM | POA: Diagnosis not present

## 2017-08-21 DIAGNOSIS — Z791 Long term (current) use of non-steroidal anti-inflammatories (NSAID): Secondary | ICD-10-CM | POA: Diagnosis not present

## 2017-08-21 DIAGNOSIS — R002 Palpitations: Secondary | ICD-10-CM | POA: Diagnosis not present

## 2017-08-21 DIAGNOSIS — Z9889 Other specified postprocedural states: Secondary | ICD-10-CM | POA: Diagnosis not present

## 2017-08-21 DIAGNOSIS — Z01818 Encounter for other preprocedural examination: Secondary | ICD-10-CM | POA: Diagnosis not present

## 2017-08-21 DIAGNOSIS — E119 Type 2 diabetes mellitus without complications: Secondary | ICD-10-CM | POA: Diagnosis not present

## 2017-08-21 DIAGNOSIS — Z794 Long term (current) use of insulin: Secondary | ICD-10-CM | POA: Diagnosis not present

## 2017-08-21 DIAGNOSIS — Z79899 Other long term (current) drug therapy: Secondary | ICD-10-CM | POA: Diagnosis not present

## 2017-08-21 DIAGNOSIS — F329 Major depressive disorder, single episode, unspecified: Secondary | ICD-10-CM | POA: Diagnosis not present

## 2017-08-21 DIAGNOSIS — I10 Essential (primary) hypertension: Secondary | ICD-10-CM | POA: Diagnosis not present

## 2017-08-21 LAB — CBC
HCT: 36.5 % (ref 35.0–47.0)
Hemoglobin: 12.3 g/dL (ref 12.0–16.0)
MCH: 29.3 pg (ref 26.0–34.0)
MCHC: 33.8 g/dL (ref 32.0–36.0)
MCV: 86.7 fL (ref 80.0–100.0)
Platelets: 194 K/uL (ref 150–440)
RBC: 4.21 MIL/uL (ref 3.80–5.20)
RDW: 12.9 % (ref 11.5–14.5)
WBC: 7.4 K/uL (ref 3.6–11.0)

## 2017-08-21 LAB — BASIC METABOLIC PANEL WITH GFR
Anion gap: 11 (ref 5–15)
BUN: 26 mg/dL — ABNORMAL HIGH (ref 6–20)
CO2: 26 mmol/L (ref 22–32)
Calcium: 9.7 mg/dL (ref 8.9–10.3)
Chloride: 103 mmol/L (ref 101–111)
Creatinine, Ser: 0.67 mg/dL (ref 0.44–1.00)
GFR calc Af Amer: >60 mL/min (ref >60)
GFR calc non Af Amer: >60 mL/min (ref >60)
Glucose, Bld: 106 mg/dL — ABNORMAL HIGH (ref 65–99)
Potassium: 3.6 mmol/L (ref 3.5–5.1)
Sodium: 140 mmol/L (ref 135–145)

## 2017-08-24 ENCOUNTER — Ambulatory Visit
Admission: RE | Admit: 2017-08-24 | Discharge: 2017-08-24 | Disposition: A | Discharge status: Home / Self Care | Payer: BLUE CROSS/BLUE SHIELD | Source: Ambulatory Visit | Attending: General Surgery | Admitting: General Surgery

## 2017-08-24 ENCOUNTER — Ambulatory Visit: Payer: BLUE CROSS/BLUE SHIELD | Admitting: Registered Nurse

## 2017-08-24 ENCOUNTER — Encounter
Admission: RE | Disposition: A | Discharge status: Home / Self Care | Payer: Self-pay | Source: Ambulatory Visit | Attending: General Surgery

## 2017-08-24 ENCOUNTER — Encounter: Payer: Self-pay | Admitting: Anesthesiology

## 2017-08-24 DIAGNOSIS — E119 Type 2 diabetes mellitus without complications: Secondary | ICD-10-CM | POA: Insufficient documentation

## 2017-08-24 DIAGNOSIS — Z87891 Personal history of nicotine dependence: Secondary | ICD-10-CM | POA: Insufficient documentation

## 2017-08-24 DIAGNOSIS — I10 Essential (primary) hypertension: Secondary | ICD-10-CM | POA: Diagnosis not present

## 2017-08-24 DIAGNOSIS — Z794 Long term (current) use of insulin: Secondary | ICD-10-CM | POA: Diagnosis not present

## 2017-08-24 DIAGNOSIS — Z791 Long term (current) use of non-steroidal anti-inflammatories (NSAID): Secondary | ICD-10-CM | POA: Diagnosis not present

## 2017-08-24 DIAGNOSIS — K602 Anal fissure, unspecified: Secondary | ICD-10-CM | POA: Diagnosis not present

## 2017-08-24 DIAGNOSIS — Z7982 Long term (current) use of aspirin: Secondary | ICD-10-CM | POA: Insufficient documentation

## 2017-08-24 DIAGNOSIS — F329 Major depressive disorder, single episode, unspecified: Secondary | ICD-10-CM | POA: Diagnosis not present

## 2017-08-24 DIAGNOSIS — Z9889 Other specified postprocedural states: Secondary | ICD-10-CM | POA: Diagnosis not present

## 2017-08-24 DIAGNOSIS — M1990 Unspecified osteoarthritis, unspecified site: Secondary | ICD-10-CM | POA: Insufficient documentation

## 2017-08-24 DIAGNOSIS — Z79899 Other long term (current) drug therapy: Secondary | ICD-10-CM | POA: Insufficient documentation

## 2017-08-24 HISTORY — DX: Cardiac arrhythmia, unspecified: I49.9

## 2017-08-24 HISTORY — PX: FISSURECTOMY: SHX5244

## 2017-08-24 HISTORY — PX: SPHINCTEROTOMY: SHX5279

## 2017-08-24 LAB — GLUCOSE, CAPILLARY
Glucose-Capillary: 151 mg/dL — ABNORMAL HIGH (ref 65–99)
Glucose-Capillary: 132 mg/dL — ABNORMAL HIGH (ref 65–99)

## 2017-08-24 SURGERY — SPHINCTEROTOMY, ANAL
Anesthesia: General | Wound class: Clean Contaminated

## 2017-08-24 MED ORDER — TRAMADOL HCL 50 MG PO TABS: 50.0000 mg | ORAL_TABLET | Freq: Four times a day (QID) | ORAL | 0 refills | Status: DC | PRN

## 2017-08-24 MED ORDER — MIDAZOLAM HCL 2 MG/2ML IJ SOLN
INTRAMUSCULAR | Status: DC | PRN
  Administered 2017-08-24: 2 mg via INTRAVENOUS

## 2017-08-24 MED ORDER — PROPOFOL 10 MG/ML IV BOLUS
INTRAVENOUS | Status: AC
  Filled 2017-08-24: qty 20

## 2017-08-24 MED ORDER — MIDAZOLAM HCL 2 MG/2ML IJ SOLN
INTRAMUSCULAR | Status: AC
  Filled 2017-08-24: qty 2

## 2017-08-24 MED ORDER — GLYCOPYRROLATE 0.2 MG/ML IJ SOLN
INTRAMUSCULAR | Status: AC
  Filled 2017-08-24: qty 1

## 2017-08-24 MED ORDER — BUPIVACAINE-EPINEPHRINE (PF) 0.5% -1:200000 IJ SOLN
INTRAMUSCULAR | Status: AC
  Filled 2017-08-24: qty 30

## 2017-08-24 MED ORDER — ONDANSETRON HCL 4 MG/2ML IJ SOLN
INTRAMUSCULAR | Status: DC | PRN
  Administered 2017-08-24: 4 mg via INTRAVENOUS

## 2017-08-24 MED ORDER — ONDANSETRON HCL 4 MG/2ML IJ SOLN: 4.0000 mg | Freq: Once | INTRAMUSCULAR | Status: DC | PRN

## 2017-08-24 MED ORDER — BUPIVACAINE HCL (PF) 0.5 % IJ SOLN
INTRAMUSCULAR | Status: AC
  Filled 2017-08-24: qty 30

## 2017-08-24 MED ORDER — LIDOCAINE HCL (CARDIAC) 20 MG/ML IV SOLN
INTRAVENOUS | Status: DC | PRN
  Administered 2017-08-24: 100 mg via INTRAVENOUS
  Administered 2017-08-24: 60 mg via INTRAVENOUS

## 2017-08-24 MED ORDER — LIDOCAINE HCL (PF) 1 % IJ SOLN
INTRAMUSCULAR | Status: AC
  Filled 2017-08-24: qty 30

## 2017-08-24 MED ORDER — DEXAMETHASONE SODIUM PHOSPHATE 10 MG/ML IJ SOLN
INTRAMUSCULAR | Status: AC
  Filled 2017-08-24: qty 1

## 2017-08-24 MED ORDER — LIDOCAINE HCL (PF) 2 % IJ SOLN
INTRAMUSCULAR | Status: AC
  Filled 2017-08-24: qty 10

## 2017-08-24 MED ORDER — BACITRACIN ZINC 500 UNIT/GM EX OINT
TOPICAL_OINTMENT | CUTANEOUS | Status: AC
  Filled 2017-08-24: qty 28.35

## 2017-08-24 MED ORDER — BUPIVACAINE HCL (PF) 0.5 % IJ SOLN
INTRAMUSCULAR | Status: DC | PRN
  Administered 2017-08-24: 27 mL

## 2017-08-24 MED ORDER — FLEET ENEMA 7-19 GM/118ML RE ENEM: 1.0000 | ENEMA | Freq: Once | RECTAL | Status: DC

## 2017-08-24 MED ORDER — ACETAMINOPHEN 10 MG/ML IV SOLN
INTRAVENOUS | Status: DC | PRN
  Administered 2017-08-24: 1000 mg via INTRAVENOUS

## 2017-08-24 MED ORDER — FENTANYL CITRATE (PF) 100 MCG/2ML IJ SOLN
INTRAMUSCULAR | Status: AC
  Filled 2017-08-24: qty 2

## 2017-08-24 MED ORDER — PROPOFOL 10 MG/ML IV BOLUS
INTRAVENOUS | Status: DC | PRN
  Administered 2017-08-24: 150 mg via INTRAVENOUS

## 2017-08-24 MED ORDER — ACETAMINOPHEN 10 MG/ML IV SOLN
INTRAVENOUS | Status: AC
  Filled 2017-08-24: qty 100

## 2017-08-24 MED ORDER — FENTANYL CITRATE (PF) 100 MCG/2ML IJ SOLN: 25.0000 ug | INTRAMUSCULAR | Status: DC | PRN

## 2017-08-24 MED ORDER — FAMOTIDINE 20 MG PO TABS
ORAL_TABLET | ORAL | Status: AC
  Administered 2017-08-24: 20 mg via ORAL
  Filled 2017-08-24: qty 1

## 2017-08-24 MED ORDER — FENTANYL CITRATE (PF) 100 MCG/2ML IJ SOLN
INTRAMUSCULAR | Status: DC | PRN
  Administered 2017-08-24 (×2): 50 ug via INTRAVENOUS

## 2017-08-24 MED ORDER — GLYCOPYRROLATE 0.2 MG/ML IJ SOLN
INTRAMUSCULAR | Status: DC | PRN
  Administered 2017-08-24: 0.2 mg via INTRAVENOUS

## 2017-08-24 MED ORDER — ONDANSETRON HCL 4 MG/2ML IJ SOLN
INTRAMUSCULAR | Status: AC
  Filled 2017-08-24: qty 2

## 2017-08-24 MED ORDER — FAMOTIDINE 20 MG PO TABS
20.0000 mg | ORAL_TABLET | Freq: Once | ORAL | Status: AC
  Administered 2017-08-24: 20 mg via ORAL

## 2017-08-24 MED ORDER — DEXAMETHASONE SODIUM PHOSPHATE 10 MG/ML IJ SOLN
INTRAMUSCULAR | Status: DC | PRN
  Administered 2017-08-24: 10 mg via INTRAVENOUS

## 2017-08-24 MED ORDER — SODIUM CHLORIDE 0.9 % IV SOLN
INTRAVENOUS | Status: DC
  Administered 2017-08-24: 50 mL/h via INTRAVENOUS

## 2017-08-24 SURGICAL SUPPLY — 23 items
BLADE SURG 15 STRL SS SAFETY (BLADE) ×2 IMPLANT
BRIEF STRETCH MATERNITY 2XLG (MISCELLANEOUS) ×2 IMPLANT
CANISTER SUCT 1200ML W/VALVE (MISCELLANEOUS) ×2 IMPLANT
DRAPE LAPAROTOMY 77X122 PED (DRAPES) ×2 IMPLANT
DRAPE LEGGINS SURG 28X43 STRL (DRAPES) ×2 IMPLANT
DRAPE UNDER BUTTOCK W/FLU (DRAPES) ×2 IMPLANT
ELECT REM PT RETURN 9FT ADLT (ELECTROSURGICAL) ×2
ELECTRODE REM PT RTRN 9FT ADLT (ELECTROSURGICAL) ×1 IMPLANT
GLOVE BIO SURGEON STRL SZ7 (GLOVE) ×2 IMPLANT
GOWN STRL REUS W/ TWL LRG LVL3 (GOWN DISPOSABLE) ×2 IMPLANT
GOWN STRL REUS W/TWL LRG LVL3 (GOWN DISPOSABLE) ×2
KIT RM TURNOVER CYSTO AR (KITS) ×2 IMPLANT
LABEL OR SOLS (LABEL) ×2 IMPLANT
NEEDLE HYPO 25X1 1.5 SAFETY (NEEDLE) ×2 IMPLANT
PACK BASIN MINOR ARMC (MISCELLANEOUS) ×2 IMPLANT
PAD OB MATERNITY 4.3X12.25 (PERSONAL CARE ITEMS) ×2 IMPLANT
PAD PREP 24X41 OB/GYN DISP (PERSONAL CARE ITEMS) ×2 IMPLANT
SOL PREP PVP 2OZ (MISCELLANEOUS) ×2
SOLUTION PREP PVP 2OZ (MISCELLANEOUS) ×1 IMPLANT
SURGILUBE 2OZ TUBE FLIPTOP (MISCELLANEOUS) ×2 IMPLANT
SUT VIC AB 3-0 SH 27 (SUTURE) ×1
SUT VIC AB 3-0 SH 27X BRD (SUTURE) ×1 IMPLANT
SYR CONTROL 10ML (SYRINGE) ×2 IMPLANT

## 2017-08-24 NOTE — Interval H&P Note (Signed)
History and Physical Interval Note:  08/24/2017 8:27 AM  Belinda Soto  has presented today for surgery, with the diagnosis of ANAL FISSURE  The various methods of treatment have been discussed with the patient and family. After consideration of risks, benefits and other options for treatment, the patient has consented to  Procedure(s): SPHINCTEROTOMY (N/A) FISSURECTOMY (N/A) as a surgical intervention .  The patient's history has been reviewed, patient examined, no change in status, stable for surgery.  I have reviewed the patient's chart and labs.  Questions were answered to the patient's satisfaction.     SANKAR,SEEPLAPUTHUR G

## 2017-08-24 NOTE — Op Note (Signed)
Preop diagnosis: Posterior anal fissure  Post op diagnosis: Same  Operation: Lateral internal sphincterotomy  Surgeon: [name]  Assistant:     Anesthesia: General  Complications: None  EBL: Less than 5 mL  Drains: None  Description: Patient was put to sleep with an LMA and then placed in the lithotomy position on the operating table.  The anal area was prepped and draped sterile field and timeout performed.  Digital and speculum examination was then completed.  Patient had a well-defined superficial posterior anal fissure.  Some bleeding was noted at the site this was cauterized.  The internal sphincter muscle was fairly tight.  At the 3 and 9:00 positions to small radial incision was made and the thickened edge of the internal sphincter was lifted up with hemostats and cut.  Both incisions were closed with single stitch on each side of 3-0 Vicryl.  0.5% Marcaine with epi was instilled totaling 27 mL in the intersphincteric grooves and along the perianal area.  Area was then covered with bacitracin ointment and dressed with pad and mesh panties.  Patient subsequently taken down from lithotomy and extubated and returned to recovery room in stable condition

## 2017-08-24 NOTE — Anesthesia Postprocedure Evaluation (Signed)
Anesthesia Post Note  Patient: Belinda Soto  Procedure(s) Performed: SPHINCTEROTOMY (N/A ) FISSURECTOMY (N/A )  Patient location during evaluation: PACU Anesthesia Type: General Level of consciousness: awake and alert Pain management: pain level controlled Vital Signs Assessment: post-procedure vital signs reviewed and stable Respiratory status: spontaneous breathing, nonlabored ventilation, respiratory function stable and patient connected to nasal cannula oxygen Cardiovascular status: blood pressure returned to baseline and stable Postop Assessment: no apparent nausea or vomiting Anesthetic complications: no     Last Vitals:  Vitals:   08/24/17 1102 08/24/17 1146  BP: 136/68 140/74  Pulse: 77 75  Resp: 14 14  Temp:    SpO2: 97% 94%    Last Pain:  Vitals:   08/24/17 1034  TempSrc: Temporal  PainSc: 0-No pain                 Mellanie Bejarano S

## 2017-08-24 NOTE — Anesthesia Procedure Notes (Signed)
Procedure Name: LMA Insertion Date/Time: 08/24/2017 9:01 AM Performed by: Doreen Salvage, CRNA Pre-anesthesia Checklist: Patient identified, Patient being monitored, Timeout performed, Emergency Drugs available and Suction available Patient Re-evaluated:Patient Re-evaluated prior to induction Oxygen Delivery Method: Circle system utilized Preoxygenation: Pre-oxygenation with 100% oxygen Induction Type: IV induction Ventilation: Mask ventilation without difficulty LMA: LMA inserted LMA Size: 3.5 Tube type: Oral Number of attempts: 1 Placement Confirmation: positive ETCO2 and breath sounds checked- equal and bilateral Tube secured with: Tape Dental Injury: Teeth and Oropharynx as per pre-operative assessment

## 2017-08-24 NOTE — Anesthesia Preprocedure Evaluation (Signed)
Anesthesia Evaluation  Patient identified by MRN, date of birth, ID band Patient awake    Reviewed: Allergy & Precautions, NPO status , Patient's Chart, lab work & pertinent test results, reviewed documented beta blocker date and time   Airway Mallampati: II  TM Distance: >3 FB     Dental  (+) Chipped   Pulmonary former smoker,           Cardiovascular hypertension, Pt. on medications and Pt. on home beta blockers      Neuro/Psych  Headaches, PSYCHIATRIC DISORDERS Depression    GI/Hepatic GERD  Controlled,  Endo/Other  diabetes, Type 2  Renal/GU      Musculoskeletal  (+) Arthritis ,   Abdominal   Peds  Hematology   Anesthesia Other Findings Can take dilaudid.  Reproductive/Obstetrics                             Anesthesia Physical Anesthesia Plan  ASA: II  Anesthesia Plan: General   Post-op Pain Management:    Induction: Intravenous  PONV Risk Score and Plan:   Airway Management Planned: LMA  Additional Equipment:   Intra-op Plan:   Post-operative Plan:   Informed Consent: I have reviewed the patients History and Physical, chart, labs and discussed the procedure including the risks, benefits and alternatives for the proposed anesthesia with the patient or authorized representative who has indicated his/her understanding and acceptance.     Plan Discussed with: CRNA  Anesthesia Plan Comments:         Anesthesia Quick Evaluation

## 2017-08-24 NOTE — Anesthesia Post-op Follow-up Note (Signed)
Anesthesia QCDR form completed.        

## 2017-08-24 NOTE — Discharge Instructions (Signed)
  AMBULATORY SURGERY  DISCHARGE INSTRUCTIONS   1) The drugs that you were given will stay in your system until tomorrow so for the next 24 hours you should not:  A) Drive an automobile B) Make any legal decisions C) Drink any alcoholic beverage   2) You may resume regular meals tomorrow.  Today it is better to start with liquids and gradually work up to solid foods.  You may eat anything you prefer, but it is better to start with liquids, then soup and crackers, and gradually work up to solid foods.   3) Please notify your doctor immediately if you have any unusual bleeding, trouble breathing, redness and pain at the surgery site, drainage, fever, or pain not relieved by medication.    4) Additional Instructions: TAKE A STOOL SOFTENER TWICE A DAY WHILE TAKING NARCOTIC PAIN MEDICINE TO PREVENT CONSTIPATION   Please contact your physician with any problems or Same Day Surgery at [phone], Monday through Friday 6 am to 4 pm, or [place] [facility] [place] Main number at [phone].   

## 2017-08-24 NOTE — Transfer of Care (Signed)
Immediate Anesthesia Transfer of Care Note  Patient: Belinda Soto  Procedure(s) Performed: Procedure(s): SPHINCTEROTOMY (N/A) FISSURECTOMY (N/A)  Patient Location: PACU  Anesthesia Type:General  Level of Consciousness: sedated  Airway & Oxygen Therapy: Patient Spontanous Breathing and Patient connected to face mask oxygen  Post-op Assessment: Report given to RN and Post -op Vital signs reviewed and stable  Post vital signs: Reviewed and stable  Last Vitals:  Vitals:   08/24/17 0825 08/24/17 0937  BP: 133/80 92/63  Pulse: 85 85  Resp: 15 10  Temp: 36.9 C 37.2 C  SpO2: 61% 68%    Complications: No apparent anesthesia complications

## 2017-08-29 ENCOUNTER — Ambulatory Visit (INDEPENDENT_AMBULATORY_CARE_PROVIDER_SITE_OTHER): Payer: BLUE CROSS/BLUE SHIELD | Admitting: General Surgery

## 2017-08-29 ENCOUNTER — Encounter: Payer: Self-pay | Admitting: General Surgery

## 2017-08-29 VITALS — BP 130/82 | HR 68 | Ht 64.0 in | Wt 171.0 lb

## 2017-08-29 DIAGNOSIS — K602 Anal fissure, unspecified: Secondary | ICD-10-CM

## 2017-08-29 NOTE — Progress Notes (Signed)
Patient ID: Belinda Soto, female   DOB: 1955-12-05, 61 y.o.   MRN: [id number]  Chief Complaint  Patient presents with  . Routine Post Op    HPI Belinda Soto is a 61 y.o. female here today for her post op spenctertomy and fissurectomy done on 08/24/2017. Patient states the area is doing well. No Pain.  HPI  Past Medical History:  Diagnosis Date  . Arthritis   . Depression   . Diabetes mellitus without complication ([place])   . Dysrhythmia    palpitations  . GERD (gastroesophageal reflux disease)    TUMS PRN  . Headache    H/O MIGRAINES  . History of kidney stones   . Hypertension   . Palpitations     Past Surgical History:  Procedure Laterality Date  . c sections    . COLONOSCOPY N/A 03/08/2015   Procedure: COLONOSCOPY;  Surgeon: Hulen Luster, MD;  Location: [facility];  Service: Gastroenterology;  Laterality: N/A;  . COLONOSCOPY  02/2015  . FISSURECTOMY N/A 08/24/2017   Procedure: FISSURECTOMY;  Surgeon: Christene Lye, MD;  Location: [facility];  Service: General;  Laterality: N/A;  . FOOT SURGERY Left 2005  . SPHINCTEROTOMY N/A 08/24/2017   Procedure: SPHINCTEROTOMY;  Surgeon: Christene Lye, MD;  Location: [facility];  Service: General;  Laterality: N/A;    Family History  Problem Relation Age of Onset  . Breast cancer Neg Hx     Social History Social History   Tobacco Use  . Smoking status: Former Smoker    Packs/day: 0.50    Years: 12.00    Pack years: 6.00    Types: Cigarettes    Last attempt to quit: 08/20/1985    Years since quitting: 32.0  . Smokeless tobacco: Never Used  Substance Use Topics  . Alcohol use: Yes    Comment: SOCIALLY  . Drug use: No    Allergies  Allergen Reactions  . Sulfa Antibiotics Swelling    Facial swelling  . Codeine Nausea And Vomiting  . Morphine And Related Itching    Current Outpatient Medications  Medication Sig Dispense Refill  . amitriptyline (ELAVIL) 10 MG tablet Take 10 mg by mouth at bedtime.     Marland Kitchen amLODipine (NORVASC) 2.5 MG tablet Take 2.5 mg by mouth every morning.     Marland Kitchen aspirin 81 MG tablet Take 81 mg by mouth daily.    . baclofen (LIORESAL) 10 MG tablet Take 1 tablet (10 mg total) by mouth 3 (three) times daily. 30 tablet 0  . calcium carbonate (TUMS - DOSED IN MG ELEMENTAL CALCIUM) 500 MG chewable tablet Chew 1 tablet by mouth as needed for indigestion or heartburn.    . celecoxib (CELEBREX) 200 MG capsule Take 200 mg by mouth daily.    . Cholecalciferol (VITAMIN D PO) Take 1,000 Units by mouth daily.     . citalopram (CELEXA) 20 MG tablet Take 20 mg by mouth at bedtime.     . dibucaine (NUPERCAINAL) 1 % OINT Place 1 application as needed rectally for hemorrhoids. 28 g 0  . ferrous sulfate 325 (65 FE) MG tablet Take 325 mg by mouth daily with breakfast.    . gabapentin (NEURONTIN) 100 MG capsule Take 300 mg by mouth 3 (three) times daily. 1 tab in am, 2 tab in afternoon and 3 in the evening    . glimepiride (AMARYL) 2 MG tablet Take 4 mg by mouth 2 (two) times daily.    . hydrochlorothiazide (HYDRODIURIL)  25 MG tablet Take 25 mg by mouth daily.    . insulin detemir (LEVEMIR) 100 UNIT/ML injection Inject 32 Units into the skin at bedtime.    . Liraglutide (VICTOZA) 18 MG/3ML SOPN Inject 18 mg into the skin daily.    Marland Kitchen losartan (COZAAR) 100 MG tablet Take 100 mg by mouth every morning.    . meloxicam (MOBIC) 7.5 MG tablet Take 7.5 mg by mouth 2 (two) times daily.    . metFORMIN (GLUCOPHAGE) 500 MG tablet Take 1,000 mg by mouth 2 (two) times daily.    . methocarbamol (ROBAXIN-750) 750 MG tablet Take 1 tablet (750 mg total) by mouth 4 (four) times daily. 20 tablet 0  . metoprolol tartrate (LOPRESSOR) 50 MG tablet Take 1 tablet (50 mg total) by mouth 2 (two) times daily. (Patient taking differently: Take 100 mg 2 (two) times daily by mouth. ) 60 tablet 11  . traMADol (ULTRAM) 50 MG tablet Take 1 tablet (50 mg total) by mouth every 6 (six) hours as needed. 20 tablet 0  . valsartan  (DIOVAN) 320 MG tablet Take 320 mg by mouth daily.     No current facility-administered medications for this visit.     Review of Systems Review of Systems  Constitutional: Negative.   Respiratory: Negative.   Cardiovascular: Negative.     Blood pressure 130/82, pulse 68, height 5\' 4"  (1.626 m), weight 171 lb (77.6 kg).  Physical Exam Physical Exam Sphincterotomy sites and fissure sites are clean and healing well Data Reviewed Op and path reviewed   Assessment   Anal fissure postop sphincterotomy Pain with bowel movements has resolved.    Plan     Patient to return as needed. The patient is aware to call back for any questions or concerns.   HPI, Physical Exam, Assessment and Plan have been scribed under the direction and in the presence of [name], MD  Gaspar Cola, CMA      I have completed the exam and reviewed the above documentation for accuracy and completeness.  I agree with the above.  Haematologist has been used and any errors in dictation or transcription are unintentional.  Leon Montoya G. Jamal Collin, M.D., F.A.C.S.   Junie Panning G 09/07/2017, 11:20 AM

## 2017-08-29 NOTE — Patient Instructions (Signed)
Patient to return as needed. The patient is aware to call back for any questions or concerns. 

## 2017-10-01 ENCOUNTER — Other Ambulatory Visit: Payer: Self-pay

## 2017-10-01 MED ORDER — LOSARTAN POTASSIUM 100 MG PO TABS: 100.0000 mg | ORAL_TABLET | ORAL | 5 refills | Status: DC

## 2017-10-16 ENCOUNTER — Other Ambulatory Visit: Payer: Self-pay | Admitting: Internal Medicine

## 2017-10-24 DIAGNOSIS — E119 Type 2 diabetes mellitus without complications: Secondary | ICD-10-CM | POA: Diagnosis not present

## 2017-11-07 ENCOUNTER — Other Ambulatory Visit: Payer: Self-pay

## 2017-11-07 MED ORDER — AMITRIPTYLINE HCL 10 MG PO TABS: 10.0000 mg | ORAL_TABLET | Freq: Every day | ORAL | 3 refills | Status: DC

## 2017-11-19 ENCOUNTER — Ambulatory Visit: Payer: BLUE CROSS/BLUE SHIELD | Admitting: Nurse Practitioner

## 2017-11-19 ENCOUNTER — Encounter: Payer: Self-pay | Admitting: Nurse Practitioner

## 2017-11-19 VITALS — BP 122/80 | HR 65 | Resp 16 | Ht 65.0 in | Wt 174.0 lb

## 2017-11-19 DIAGNOSIS — R229 Localized swelling, mass and lump, unspecified: Secondary | ICD-10-CM | POA: Diagnosis not present

## 2017-11-19 DIAGNOSIS — I10 Essential (primary) hypertension: Secondary | ICD-10-CM | POA: Diagnosis not present

## 2017-11-19 DIAGNOSIS — B370 Candidal stomatitis: Secondary | ICD-10-CM | POA: Diagnosis not present

## 2017-11-19 DIAGNOSIS — IMO0002 Reserved for concepts with insufficient information to code with codable children: Secondary | ICD-10-CM | POA: Insufficient documentation

## 2017-11-19 DIAGNOSIS — E1165 Type 2 diabetes mellitus with hyperglycemia: Secondary | ICD-10-CM

## 2017-11-19 MED ORDER — NYSTATIN 100000 UNIT/ML MT SUSP: 5.0000 mL | Freq: Four times a day (QID) | OROMUCOSAL | 1 refills | Status: DC

## 2017-11-19 NOTE — Progress Notes (Signed)
[facility] [place], [place] [address]  Internal MEDICINE  Office Visit Note  Patient Name: Belinda Soto  [id number]  [id number]  Date of Service: 12/05/2017  Chief Complaint  Patient presents with  . Diabetes    The patient is here for routine follow up exam . She states that he has been having trouble with the feeling on the tip of her tongue. regularly feels like it has been burnt. Mostly feels like this after she eats. Improves in between meals. Has not noted any actual burns or lesions.    Diabetes  She presents for her follow-up diabetic visit. She has type 2 diabetes mellitus. Her disease course has been stable. There are no hypoglycemic associated symptoms. Pertinent negatives for hypoglycemia include no headaches, nervousness/anxiousness or tremors. Associated symptoms include fatigue, polydipsia and polyuria. Pertinent negatives for diabetes include no chest pain. There are no hypoglycemic complications. Symptoms are stable. Diabetic complications include peripheral neuropathy. Risk factors for coronary artery disease include diabetes mellitus, dyslipidemia and hypertension. Current diabetic treatment includes oral agent (monotherapy) (victoza). She is compliant with treatment all of the time. Her weight is stable. She is following a generally healthy diet. Meal planning includes avoidance of concentrated sweets. She has not had a previous visit with a dietitian. She participates in exercise intermittently. There is no change in her home blood glucose trend. An ACE inhibitor/angiotensin II receptor blocker is being taken. She does not see a podiatrist.Eye exam is current.    Pt is here for routine follow up.    Current Medication: Outpatient Encounter Medications as of 11/19/2017  Medication Sig  . amitriptyline (ELAVIL) 10 MG tablet Take 1 tablet (10 mg total) by mouth at bedtime.  Marland Kitchen amLODipine (NORVASC) 2.5 MG tablet Take 2.5 mg by mouth every  morning.   Marland Kitchen aspirin 81 MG tablet Take 81 mg by mouth daily.  . calcium carbonate (TUMS - DOSED IN MG ELEMENTAL CALCIUM) 500 MG chewable tablet Chew 1 tablet by mouth as needed for indigestion or heartburn.  . Cholecalciferol (VITAMIN D PO) Take 1,000 Units by mouth daily.   . citalopram (CELEXA) 20 MG tablet Take 20 mg by mouth at bedtime.   . gabapentin (NEURONTIN) 300 MG capsule TAKE 1 CAPSULE BY MOUTH IN THE MORNING AND TAKE 2 CAPSULES IN THE AFTERNOON, AND TAKE 3 CAPSULES IN EVENING.  Marland Kitchen glimepiride (AMARYL) 2 MG tablet Take 4 mg by mouth 2 (two) times daily.  . insulin detemir (LEVEMIR) 100 UNIT/ML injection Inject 32 Units into the skin at bedtime.  . Liraglutide (VICTOZA) 18 MG/3ML SOPN Inject 18 mg into the skin daily.  Marland Kitchen losartan (COZAAR) 100 MG tablet Take 1 tablet (100 mg total) by mouth every morning.  . meloxicam (MOBIC) 7.5 MG tablet Take 7.5 mg by mouth 2 (two) times daily.  . metFORMIN (GLUCOPHAGE) 500 MG tablet Take 1,000 mg by mouth 2 (two) times daily.  . metoprolol tartrate (LOPRESSOR) 50 MG tablet Take 1 tablet (50 mg total) by mouth 2 (two) times daily. (Patient taking differently: Take 100 mg 2 (two) times daily by mouth. )  . [DISCONTINUED] hydrochlorothiazide (HYDRODIURIL) 25 MG tablet Take 25 mg by mouth daily.  . baclofen (LIORESAL) 10 MG tablet Take 1 tablet (10 mg total) by mouth 3 (three) times daily. (Patient not taking: Reported on 11/19/2017)  . ferrous sulfate 325 (65 FE) MG tablet Take 325 mg by mouth daily with breakfast.  . hydrocortisone 2.5 % ointment Apply topically.  Marland Kitchen  nystatin (MYCOSTATIN) 100000 UNIT/ML suspension Take 5 mLs (500,000 Units total) by mouth 4 (four) times daily.  . [DISCONTINUED] celecoxib (CELEBREX) 200 MG capsule Take 200 mg by mouth daily.  . [DISCONTINUED] dibucaine (NUPERCAINAL) 1 % OINT Place 1 application as needed rectally for hemorrhoids. (Patient not taking: Reported on 11/19/2017)  . [DISCONTINUED] gabapentin (NEURONTIN) 100 MG  capsule Take 300 mg by mouth 3 (three) times daily. 1 tab in am, 2 tab in afternoon and 3 in the evening  . [DISCONTINUED] methocarbamol (ROBAXIN-750) 750 MG tablet Take 1 tablet (750 mg total) by mouth 4 (four) times daily. (Patient not taking: Reported on 11/19/2017)  . [DISCONTINUED] nystatin (MYCOSTATIN) 100000 UNIT/ML suspension Take 5 mLs (500,000 Units total) by mouth 4 (four) times daily.  . [DISCONTINUED] traMADol (ULTRAM) 50 MG tablet Take 1 tablet (50 mg total) by mouth every 6 (six) hours as needed. (Patient not taking: Reported on 11/19/2017)  . [DISCONTINUED] valsartan (DIOVAN) 320 MG tablet Take 320 mg by mouth daily.   No facility-administered encounter medications on file as of 11/19/2017.     Surgical History: Past Surgical History:  Procedure Laterality Date  . c sections    . COLONOSCOPY N/A 03/08/2015   Procedure: COLONOSCOPY;  Surgeon: Hulen Luster, MD;  Location: [facility];  Service: Gastroenterology;  Laterality: N/A;  . COLONOSCOPY  02/2015  . FISSURECTOMY N/A 08/24/2017   Procedure: FISSURECTOMY;  Surgeon: Christene Lye, MD;  Location: [facility];  Service: General;  Laterality: N/A;  . FOOT SURGERY Left 2005  . SPHINCTEROTOMY N/A 08/24/2017   Procedure: SPHINCTEROTOMY;  Surgeon: Christene Lye, MD;  Location: [facility];  Service: General;  Laterality: N/A;    Medical History: Past Medical History:  Diagnosis Date  . Arthritis   . Depression   . Diabetes mellitus without complication ([place])   . Dysrhythmia    palpitations  . GERD (gastroesophageal reflux disease)    TUMS PRN  . Headache    H/O MIGRAINES  . History of kidney stones   . Hypertension   . Palpitations     Family History: Family History  Problem Relation Age of Onset  . Breast cancer Neg Hx     Social History   Socioeconomic History  . Marital status: Married    Spouse name: Not on file  . Number of children: Not on file  . Years of education: Not on file  . Highest  education level: Not on file  Social Needs  . Financial resource strain: Not on file  . Food insecurity - worry: Not on file  . Food insecurity - inability: Not on file  . Transportation needs - medical: Not on file  . Transportation needs - non-medical: Not on file  Occupational History  . Not on file  Tobacco Use  . Smoking status: Former Smoker    Packs/day: 0.50    Years: 12.00    Pack years: 6.00    Types: Cigarettes    Last attempt to quit: 08/20/1985    Years since quitting: 32.3  . Smokeless tobacco: Never Used  Substance and Sexual Activity  . Alcohol use: Yes    Comment: SOCIALLY  . Drug use: No  . Sexual activity: Not on file  Other Topics Concern  . Not on file  Social History Narrative  . Not on file      Review of Systems  Constitutional: Positive for fatigue. Negative for activity change, chills and unexpected weight change.  HENT: Negative for  congestion, postnasal drip, rhinorrhea, sneezing and sore throat.   Eyes: Negative.  Negative for redness.  Respiratory: Negative for cough, chest tightness and shortness of breath.   Cardiovascular: Negative for chest pain and palpitations.  Gastrointestinal: Negative for abdominal pain, constipation, diarrhea, nausea and vomiting.  Endocrine: Positive for polydipsia and polyuria.       Blood sugars doing well   Genitourinary: Negative for dysuria and frequency.  Musculoskeletal: Negative for arthralgias, back pain, joint swelling and neck pain.  Skin: Negative for rash.  Allergic/Immunologic: Negative for environmental allergies, food allergies and immunocompromised state.  Neurological: Negative for tremors, numbness and headaches.  Hematological: Negative for adenopathy. Does not bruise/bleed easily.  Psychiatric/Behavioral: Negative for behavioral problems (Depression), sleep disturbance and suicidal ideas. The patient is not nervous/anxious.     Today's Vitals   11/19/17 1646  BP: 122/80  Pulse: 65    Resp: 16  SpO2: 96%  Weight: 174 lb (78.9 kg)  Height: 5\' 5"  (1.651 m)    Physical Exam  Constitutional: She is oriented to person, place, and time. She appears well-developed and well-nourished. No distress.  HENT:  Head: Normocephalic and atraumatic.  Mouth/Throat: Oropharynx is clear and moist. No oropharyngeal exudate.  Eyes: EOM are normal. Pupils are equal, round, and reactive to light.  Neck: Normal range of motion. Neck supple. No JVD present. Carotid bruit is not present. No tracheal deviation present. No thyromegaly present.    Cardiovascular: Normal rate, regular rhythm and normal heart sounds. Exam reveals no gallop and no friction rub.  No murmur heard. Pulmonary/Chest: Effort normal and breath sounds normal. No respiratory distress. She has no wheezes. She has no rales. She exhibits no tenderness.  Abdominal: Soft. Bowel sounds are normal. There is no tenderness.  Musculoskeletal: Normal range of motion.  Lymphadenopathy:    She has no cervical adenopathy.  Neurological: She is alert and oriented to person, place, and time. No cranial nerve deficit.  Skin: Skin is warm and dry. She is not diaphoretic.  Psychiatric: She has a normal mood and affect. Her behavior is normal. Judgment and thought content normal.  Nursing note and vitals reviewed.    Assessment/Plan: 1. Uncontrolled type 2 diabetes mellitus with hyperglycemia ([facility]) - POCT HgB A1C  2. Essential hypertension Stable. Continue bp medications as prescribed.   3. Oral candidiasis - nystatin (MYCOSTATIN) 100000 UNIT/ML suspension; Take 5 mLs (500,000 Units total) by mouth 4 (four) times daily.  Dispense: 200 mL; Refill: 3  4. Local superficial swelling, mass or lump Will get ultrasound of soft tissue mass lin left side of the neck. Getting slightly larger.  - US Soft Tissue Head/Neck; Future  General Counseling: fatiha guzy understanding of the findings of todays visit and agrees with plan of  treatment. I have discussed any further diagnostic evaluation that may be needed or ordered today. We also reviewed her medications today. she has been encouraged to call the office with any questions or concerns that should arise related to todays visit.   This patient was seen by Leretha Pol, FNP- C in Collaboration with Dr Lavera Guise as a part of collaborative care agreement    Orders Placed This Encounter  Procedures  . US Soft Tissue Head/Neck  . POCT HgB A1C    Meds ordered this encounter  Medications  . DISCONTD: nystatin (MYCOSTATIN) 100000 UNIT/ML suspension    Sig: Take 5 mLs (500,000 Units total) by mouth 4 (four) times daily.    Dispense:  200 mL  Refill:  1    Order Specific Question:   Supervising Provider    Answer:   Lavera Guise [[id number]]  . nystatin (MYCOSTATIN) 100000 UNIT/ML suspension    Sig: Take 5 mLs (500,000 Units total) by mouth 4 (four) times daily.    Dispense:  200 mL    Refill:  3    Order Specific Question:   Supervising Provider    Answer:   Lavera Guise [[id number]]    Time spent: 60 Minutes     Dr Lavera Guise Internal medicine

## 2017-11-23 MED ORDER — NYSTATIN 100000 UNIT/ML MT SUSP: 5.0000 mL | Freq: Four times a day (QID) | OROMUCOSAL | 3 refills | Status: DC

## 2017-11-27 ENCOUNTER — Other Ambulatory Visit: Payer: Self-pay

## 2017-11-27 MED ORDER — HYDROCHLOROTHIAZIDE 25 MG PO TABS: 25.0000 mg | ORAL_TABLET | Freq: Every day | ORAL | 5 refills | Status: DC

## 2017-11-28 ENCOUNTER — Other Ambulatory Visit: Payer: Self-pay

## 2017-12-02 DIAGNOSIS — B370 Candidal stomatitis: Secondary | ICD-10-CM | POA: Insufficient documentation

## 2017-12-02 DIAGNOSIS — R229 Localized swelling, mass and lump, unspecified: Secondary | ICD-10-CM | POA: Insufficient documentation

## 2017-12-05 LAB — POCT GLYCOSYLATED HEMOGLOBIN (HGB A1C): Hemoglobin A1C: 6.1

## 2017-12-10 ENCOUNTER — Other Ambulatory Visit: Payer: Self-pay

## 2017-12-14 ENCOUNTER — Other Ambulatory Visit: Payer: Self-pay

## 2017-12-14 ENCOUNTER — Telehealth: Payer: Self-pay

## 2017-12-14 ENCOUNTER — Ambulatory Visit: Payer: BLUE CROSS/BLUE SHIELD

## 2017-12-14 DIAGNOSIS — R229 Localized swelling, mass and lump, unspecified: Secondary | ICD-10-CM | POA: Diagnosis not present

## 2017-12-14 MED ORDER — FLUCONAZOLE 150 MG PO TABS: ORAL_TABLET | ORAL | 0 refills | Status: DC

## 2017-12-14 NOTE — Telephone Encounter (Signed)
Spoke with pt nystatin is backorder as per [employer] send diflucan

## 2017-12-20 ENCOUNTER — Encounter: Payer: Self-pay | Admitting: Nurse Practitioner

## 2017-12-20 ENCOUNTER — Ambulatory Visit: Payer: BLUE CROSS/BLUE SHIELD | Admitting: Nurse Practitioner

## 2017-12-20 VITALS — BP 129/81 | HR 77 | Resp 16 | Ht 65.0 in | Wt 171.4 lb

## 2017-12-20 DIAGNOSIS — K219 Gastro-esophageal reflux disease without esophagitis: Secondary | ICD-10-CM

## 2017-12-20 DIAGNOSIS — R221 Localized swelling, mass and lump, neck: Secondary | ICD-10-CM | POA: Diagnosis not present

## 2017-12-20 DIAGNOSIS — E1165 Type 2 diabetes mellitus with hyperglycemia: Secondary | ICD-10-CM

## 2017-12-20 DIAGNOSIS — I10 Essential (primary) hypertension: Secondary | ICD-10-CM

## 2017-12-20 MED ORDER — OMEPRAZOLE 20 MG PO CPDR: 20.0000 mg | DELAYED_RELEASE_CAPSULE | Freq: Every day | ORAL | 3 refills | Status: DC

## 2017-12-20 NOTE — Progress Notes (Signed)
[facility] [place], [place] [address]  Internal MEDICINE  Office Visit Note  Patient Name: Belinda Soto  [id number]  [id number]  Date of Service: 01/13/2018  Chief Complaint  Patient presents with  . Follow-up  . Heartburn     BEEN GOING ON FO 2-3 MONTHS WHEN BENDING OVER OR EATING LATE AT NIGHT    The patient is here for routine visit. Today, she is c/o increased acid reflux and heartburn. Is especially bad at night. Is trying to limit intake of spicy foods and is trying not to eat late at night. Has not noted much improvement,  She also has palpable area of swelling at the base of left neck. Has been present for some time, bu she feels like this is getting larger. Does not cause any pain. Does not interfere with the ROM of her neck. No breaks in skin or drainage are reported. She has recently hhad ultrasound of this area and is here to review the results.    Pt is here for routine follow up.    Current Medication: Outpatient Encounter Medications as of 12/20/2017  Medication Sig  . amitriptyline (ELAVIL) 10 MG tablet Take 1 tablet (10 mg total) by mouth at bedtime.  Marland Kitchen amLODipine (NORVASC) 2.5 MG tablet Take 2.5 mg by mouth every morning.   Marland Kitchen aspirin 81 MG tablet Take 81 mg by mouth daily.  . baclofen (LIORESAL) 10 MG tablet Take 1 tablet (10 mg total) by mouth 3 (three) times daily. (Patient not taking: Reported on 11/19/2017)  . calcium carbonate (TUMS - DOSED IN MG ELEMENTAL CALCIUM) 500 MG chewable tablet Chew 1 tablet by mouth as needed for indigestion or heartburn.  . Cholecalciferol (VITAMIN D PO) Take 1,000 Units by mouth daily.   . citalopram (CELEXA) 20 MG tablet Take 20 mg by mouth at bedtime.   . ferrous sulfate 325 (65 FE) MG tablet Take 325 mg by mouth daily with breakfast.  . fluconazole (DIFLUCAN) 150 MG tablet Take 1 tab po once and then repeat in three days if symptoms persit  . gabapentin (NEURONTIN) 300 MG capsule TAKE 1 CAPSULE BY  MOUTH IN THE MORNING AND TAKE 2 CAPSULES IN THE AFTERNOON, AND TAKE 3 CAPSULES IN EVENING.  Marland Kitchen glimepiride (AMARYL) 2 MG tablet Take 4 mg by mouth 2 (two) times daily.  . hydrochlorothiazide (HYDRODIURIL) 25 MG tablet Take 1 tablet (25 mg total) by mouth daily.  . hydrocortisone 2.5 % ointment Apply topically.  . insulin detemir (LEVEMIR) 100 UNIT/ML injection Inject 32 Units into the skin at bedtime.  . Liraglutide (VICTOZA) 18 MG/3ML SOPN Inject 18 mg into the skin daily.  Marland Kitchen losartan (COZAAR) 100 MG tablet Take 1 tablet (100 mg total) by mouth every morning.  . meloxicam (MOBIC) 7.5 MG tablet Take 7.5 mg by mouth 2 (two) times daily.  . metFORMIN (GLUCOPHAGE) 500 MG tablet Take 1,000 mg by mouth 2 (two) times daily.  . metoprolol tartrate (LOPRESSOR) 50 MG tablet Take 1 tablet (50 mg total) by mouth 2 (two) times daily. (Patient taking differently: Take 100 mg 2 (two) times daily by mouth. )  . omeprazole (PRILOSEC) 20 MG capsule Take 1 capsule (20 mg total) by mouth daily.   No facility-administered encounter medications on file as of 12/20/2017.     Surgical History: Past Surgical History:  Procedure Laterality Date  . c sections    . COLONOSCOPY N/A 03/08/2015   Procedure: COLONOSCOPY;  Surgeon: Hulen Luster,  MD;  Location: [facility] ENDOSCOPY;  Service: Gastroenterology;  Laterality: N/A;  . COLONOSCOPY  02/2015  . FISSURECTOMY N/A 08/24/2017   Procedure: FISSURECTOMY;  Surgeon: Christene Lye, MD;  Location: [facility] ORS;  Service: General;  Laterality: N/A;  . FOOT SURGERY Left 2005  . SPHINCTEROTOMY N/A 08/24/2017   Procedure: SPHINCTEROTOMY;  Surgeon: Christene Lye, MD;  Location: [facility] ORS;  Service: General;  Laterality: N/A;    Medical History: Past Medical History:  Diagnosis Date  . Arthritis   . Depression   . Diabetes mellitus without complication ([place])   . Dysrhythmia    palpitations  . GERD (gastroesophageal reflux disease)    TUMS PRN  . Headache    H/O  MIGRAINES  . History of kidney stones   . Hypertension   . Palpitations     Family History: Family History  Problem Relation Age of Onset  . Diabetes Mother   . Hypertension Mother   . Glaucoma Maternal Grandmother   . Alzheimer's disease Maternal Grandmother   . Breast cancer Neg Hx     Social History   Socioeconomic History  . Marital status: Married    Spouse name: Not on file  . Number of children: Not on file  . Years of education: Not on file  . Highest education level: Not on file  Occupational History  . Not on file  Social Needs  . Financial resource strain: Not on file  . Food insecurity:    Worry: Not on file    Inability: Not on file  . Transportation needs:    Medical: Not on file    Non-medical: Not on file  Tobacco Use  . Smoking status: Former Smoker    Packs/day: 0.50    Years: 12.00    Pack years: 6.00    Types: Cigarettes    Last attempt to quit: 08/20/1985    Years since quitting: 32.4  . Smokeless tobacco: Never Used  Substance and Sexual Activity  . Alcohol use: Yes    Comment: SOCIALLY  . Drug use: No  . Sexual activity: Not on file  Lifestyle  . Physical activity:    Days per week: Not on file    Minutes per session: Not on file  . Stress: Not on file  Relationships  . Social connections:    Talks on phone: Not on file    Gets together: Not on file    Attends religious service: Not on file    Active member of club or organization: Not on file    Attends meetings of clubs or organizations: Not on file    Relationship status: Not on file  . Intimate partner violence:    Fear of current or ex partner: Not on file    Emotionally abused: Not on file    Physically abused: Not on file    Forced sexual activity: Not on file  Other Topics Concern  . Not on file  Social History Narrative  . Not on file      Review of Systems  Constitutional: Negative for activity change, chills, fatigue and unexpected weight change.  HENT:  Negative for congestion, postnasal drip, rhinorrhea, sneezing and sore throat.   Eyes: Negative.  Negative for redness.  Respiratory: Negative for cough, chest tightness, shortness of breath and wheezing.   Cardiovascular: Negative for chest pain and palpitations.  Gastrointestinal: Negative for abdominal pain, constipation, diarrhea, nausea and vomiting.       Increased acid reflux and  heart burn.   Endocrine: Positive for polydipsia and polyuria.       Blood sugars doing well   Genitourinary: Negative for dysuria and frequency.  Musculoskeletal: Negative for arthralgias, back pain, joint swelling and neck pain.       Palpable area of welling at the base of the left side of her neck. Not painful. Not interfering with ROM.   Skin: Negative for rash.  Allergic/Immunologic: Negative for environmental allergies, food allergies and immunocompromised state.  Neurological: Negative for tremors, numbness and headaches.  Hematological: Negative for adenopathy. Does not bruise/bleed easily.  Psychiatric/Behavioral: Negative for behavioral problems (Depression), sleep disturbance and suicidal ideas. The patient is not nervous/anxious.    Today's Vitals   12/20/17 1632  BP: 129/81  Pulse: 77  Resp: 16  SpO2: 97%  Weight: 171 lb 6.4 oz (77.7 kg)  Height: 5\' 5"  (1.651 m)    Physical Exam  Constitutional: She is oriented to person, place, and time. She appears well-developed and well-nourished. No distress.  HENT:  Head: Normocephalic and atraumatic.  Mouth/Throat: Oropharynx is clear and moist. No oropharyngeal exudate.  Eyes: Pupils are equal, round, and reactive to light. EOM are normal.  Neck: Normal range of motion. Neck supple. No JVD present. Carotid bruit is not present. No tracheal deviation present. No thyromegaly present.    Cardiovascular: Normal rate, regular rhythm and normal heart sounds. Exam reveals no gallop and no friction rub.  No murmur heard. Pulmonary/Chest: Effort  normal and breath sounds normal. No respiratory distress. She has no wheezes. She has no rales. She exhibits no tenderness.  Abdominal: Soft. Bowel sounds are normal. There is no tenderness.  Musculoskeletal: Normal range of motion.  Lymphadenopathy:    She has no cervical adenopathy.  Neurological: She is alert and oriented to person, place, and time. No cranial nerve deficit.  Skin: Skin is warm and dry. She is not diaphoretic.  Psychiatric: She has a normal mood and affect. Her behavior is normal. Judgment and thought content normal.  Nursing note and vitals reviewed.  Assessment/Plan: 1. Localized swelling, mass or lump of neck Recent ultrasound showing no definite are of swelling or fluid collection. Will get CT scan for urther evaluation.  - CT Soft Tissue Neck W Contrast; Future  2. Gastroesophageal reflux disease without esophagitis Start PPI. Recommend she avoid spicy foods and to avoid eating less than 2 hours prior to going to bed. Sleep with HO raised to 30 degrees. Reassess at next visit.  - omeprazole (PRILOSEC) 20 MG capsule; Take 1 capsule (20 mg total) by mouth daily.  Dispense: 30 capsule; Refill: 3  3. Essential hypertension Stable. Continue BP medication as prescribed  4. Uncontrolled type 2 diabetes mellitus with hyperglycemia ([place]) Continue all diabetic medications as prescribed.   General Counseling: dekayla prestridge understanding of the findings of todays visit and agrees with plan of treatment. I have discussed any further diagnostic evaluation that may be needed or ordered today. We also reviewed her medications today. she has been encouraged to call the office with any questions or concerns that should arise related to todays visit.  This patient was seen by Leretha Pol, FNP- C in Collaboration with Dr Lavera Guise as a part of collaborative care agreement   Orders Placed This Encounter  Procedures  . CT Soft Tissue Neck W Contrast    Meds ordered this  encounter  Medications  . omeprazole (PRILOSEC) 20 MG capsule    Sig: Take 1 capsule (20 mg  total) by mouth daily.    Dispense:  30 capsule    Refill:  3    Order Specific Question:   Supervising Provider    Answer:   Lavera Guise [[id number]]    Time spent: 34 Minutes          Dr Lavera Guise Internal medicine

## 2017-12-21 ENCOUNTER — Telehealth: Payer: Self-pay

## 2017-12-21 NOTE — Telephone Encounter (Signed)
Patient has been advised that her Ct Neck/soft tissue is scheduled for 01/01/18 @ 4:00 KP. tit[place]

## 2017-12-26 ENCOUNTER — Telehealth: Payer: Self-pay | Admitting: Nurse Practitioner

## 2017-12-26 ENCOUNTER — Other Ambulatory Visit: Payer: Self-pay | Admitting: Nurse Practitioner

## 2017-12-26 DIAGNOSIS — J019 Acute sinusitis, unspecified: Secondary | ICD-10-CM

## 2017-12-26 MED ORDER — FLUTICASONE PROPIONATE 50 MCG/ACT NA SUSP: 2.0000 | Freq: Every day | NASAL | 6 refills | Status: DC

## 2017-12-26 MED ORDER — AMOXICILLIN-POT CLAVULANATE 875-125 MG PO TABS: 1.0000 | ORAL_TABLET | Freq: Two times a day (BID) | ORAL | 0 refills | Status: DC

## 2017-12-26 NOTE — Telephone Encounter (Signed)
Tried calling pt to advise RX called into her pharmacy pt vmail full and unable to lmom, her RX's are at [facility]

## 2017-12-26 NOTE — Telephone Encounter (Signed)
Patient called the office c/o symptoms consistent with sinusitis. Sent in augmentin twice daily for 10 days and flonase nasal spray. Recommend she use OTC muscina to help relieve symptoms.

## 2017-12-26 NOTE — Progress Notes (Signed)
Patient called the office c/o symptoms consistent with sinusitis. Sent in augmentin twice daily for 10 days and flonase nasal spray. Recommend she use OTC muscina to help relieve symptoms.

## 2018-01-01 ENCOUNTER — Ambulatory Visit: Payer: BLUE CROSS/BLUE SHIELD | Attending: Nurse Practitioner

## 2018-01-10 ENCOUNTER — Other Ambulatory Visit: Payer: Self-pay | Admitting: Internal Medicine

## 2018-01-13 DIAGNOSIS — K219 Gastro-esophageal reflux disease without esophagitis: Secondary | ICD-10-CM | POA: Insufficient documentation

## 2018-01-13 DIAGNOSIS — R221 Localized swelling, mass and lump, neck: Secondary | ICD-10-CM | POA: Insufficient documentation

## 2018-01-16 ENCOUNTER — Other Ambulatory Visit: Payer: Self-pay

## 2018-01-16 MED ORDER — GABAPENTIN 300 MG PO CAPS: ORAL_CAPSULE | ORAL | 2 refills | Status: DC

## 2018-01-31 ENCOUNTER — Other Ambulatory Visit: Payer: Self-pay

## 2018-01-31 MED ORDER — MELOXICAM 7.5 MG PO TABS: 7.5000 mg | ORAL_TABLET | Freq: Two times a day (BID) | ORAL | 5 refills | Status: DC

## 2018-02-11 ENCOUNTER — Other Ambulatory Visit: Payer: Self-pay

## 2018-02-11 MED ORDER — METOPROLOL TARTRATE 100 MG PO TABS: 100.0000 mg | ORAL_TABLET | Freq: Two times a day (BID) | ORAL | 6 refills | Status: DC

## 2018-02-22 ENCOUNTER — Ambulatory Visit: Payer: BLUE CROSS/BLUE SHIELD | Admitting: Nurse Practitioner

## 2018-02-22 ENCOUNTER — Encounter: Payer: Self-pay | Admitting: Nurse Practitioner

## 2018-02-22 VITALS — BP 129/76 | HR 82 | Resp 16 | Ht 65.0 in | Wt 170.6 lb

## 2018-02-22 DIAGNOSIS — E1140 Type 2 diabetes mellitus with diabetic neuropathy, unspecified: Secondary | ICD-10-CM | POA: Diagnosis not present

## 2018-02-22 DIAGNOSIS — R221 Localized swelling, mass and lump, neck: Secondary | ICD-10-CM | POA: Diagnosis not present

## 2018-02-22 DIAGNOSIS — I10 Essential (primary) hypertension: Secondary | ICD-10-CM | POA: Diagnosis not present

## 2018-02-22 DIAGNOSIS — K219 Gastro-esophageal reflux disease without esophagitis: Secondary | ICD-10-CM | POA: Diagnosis not present

## 2018-02-22 DIAGNOSIS — Z794 Long term (current) use of insulin: Secondary | ICD-10-CM

## 2018-02-22 DIAGNOSIS — B370 Candidal stomatitis: Secondary | ICD-10-CM | POA: Diagnosis not present

## 2018-02-22 DIAGNOSIS — E119 Type 2 diabetes mellitus without complications: Secondary | ICD-10-CM

## 2018-02-22 LAB — POCT GLYCOSYLATED HEMOGLOBIN (HGB A1C): Hemoglobin A1C: 6.7 % — AB (ref 4.0–5.6)

## 2018-02-22 MED ORDER — GABAPENTIN 300 MG PO CAPS: ORAL_CAPSULE | ORAL | 2 refills | Status: DC

## 2018-02-22 MED ORDER — NYSTATIN 100000 UNIT/ML MT SUSP: 5.0000 mL | Freq: Four times a day (QID) | OROMUCOSAL | 3 refills | Status: DC

## 2018-02-22 NOTE — Progress Notes (Signed)
[facility] [place], [place] [address]  Internal MEDICINE  Office Visit Note  Patient Name: Belinda Soto  [id number]  [id number]  Date of Service: 03/13/2018   Pt is here for routine follow up.    Chief Complaint  Patient presents with  . Neck Pain    follow up on localized swellingmass or lump of neck  . Diabetes    The patient recently had ultrasound done of soft tissue swelling at the base of her neck. Area consistent with fluid filled cyst. No changes, no tenderness reported today.   Diabetes  She presents for her follow-up diabetic visit. She has type 2 diabetes mellitus. Her disease course has been stable. Hypoglycemia symptoms include headaches. Pertinent negatives for hypoglycemia include no nervousness/anxiousness or tremors. There are no diabetic associated symptoms. Pertinent negatives for diabetes include no chest pain and no fatigue. There are no hypoglycemic complications. Symptoms are stable. There are no diabetic complications. Risk factors for coronary artery disease include diabetes mellitus, dyslipidemia, hypertension and stress. Current diabetic treatment includes insulin injections and oral agent (dual therapy). She is compliant with treatment most of the time. Her weight is stable. She is following a generally healthy diet. Meal planning includes avoidance of concentrated sweets. She has had a previous visit with a dietitian. She participates in exercise three times a week. There is no change in her home blood glucose trend. An ACE inhibitor/angiotensin II receptor blocker is not being taken. She does not see a podiatrist.Eye exam is current.      Current Medication: Outpatient Encounter Medications as of 02/22/2018  Medication Sig  . amitriptyline (ELAVIL) 10 MG tablet Take 1 tablet (10 mg total) by mouth at bedtime.  Marland Kitchen amLODipine (NORVASC) 2.5 MG tablet Take 2.5 mg by mouth every morning.   Marland Kitchen amoxicillin-clavulanate (AUGMENTIN)  875-125 MG tablet Take 1 tablet by mouth 2 (two) times daily.  Marland Kitchen aspirin 81 MG tablet Take 81 mg by mouth daily.  . baclofen (LIORESAL) 10 MG tablet Take 1 tablet (10 mg total) by mouth 3 (three) times daily.  . calcium carbonate (TUMS - DOSED IN MG ELEMENTAL CALCIUM) 500 MG chewable tablet Chew 1 tablet by mouth as needed for indigestion or heartburn.  . Cholecalciferol (VITAMIN D PO) Take 1,000 Units by mouth daily.   . ferrous sulfate 325 (65 FE) MG tablet Take 325 mg by mouth daily with breakfast.  . fluconazole (DIFLUCAN) 150 MG tablet Take 1 tab po once and then repeat in three days if symptoms persit  . fluticasone (FLONASE) 50 MCG/ACT nasal spray Place 2 sprays into both nostrils daily.  Marland Kitchen gabapentin (NEURONTIN) 300 MG capsule TAKE 2 CAPSULE BY MOUTH IN THE MORNING AND TAKE 2 CAPSULES IN THE AFTERNOON, AND TAKE 3 CAPSULES IN EVENING.  Marland Kitchen glimepiride (AMARYL) 2 MG tablet Take 4 mg by mouth 2 (two) times daily.  . hydrochlorothiazide (HYDRODIURIL) 25 MG tablet Take 1 tablet (25 mg total) by mouth daily.  . hydrocortisone 2.5 % ointment Apply topically.  . insulin detemir (LEVEMIR) 100 UNIT/ML injection Inject 32 Units into the skin at bedtime.  Marland Kitchen LEVEMIR FLEXTOUCH 100 UNIT/ML Pen INJECT 40 UNITS SUBCUTANEOUSLY AT NIGHT  . losartan (COZAAR) 100 MG tablet Take 1 tablet (100 mg total) by mouth every morning.  . meloxicam (MOBIC) 7.5 MG tablet Take 1 tablet (7.5 mg total) by mouth 2 (two) times daily.  . metFORMIN (GLUCOPHAGE) 500 MG tablet Take 1,000 mg by mouth 2 (two) times daily.  Marland Kitchen  metoprolol tartrate (LOPRESSOR) 100 MG tablet Take 1 tablet (100 mg total) by mouth 2 (two) times daily.  Marland Kitchen omeprazole (PRILOSEC) 20 MG capsule Take 1 capsule (20 mg total) by mouth daily.  . [DISCONTINUED] citalopram (CELEXA) 20 MG tablet Take 20 mg by mouth at bedtime.   . [DISCONTINUED] gabapentin (NEURONTIN) 300 MG capsule TAKE 1 CAPSULE BY MOUTH IN THE MORNING AND TAKE 2 CAPSULES IN THE AFTERNOON, AND TAKE 3  CAPSULES IN EVENING.  . [DISCONTINUED] Liraglutide (VICTOZA) 18 MG/3ML SOPN Inject 18 mg into the skin daily.  Marland Kitchen nystatin (MYCOSTATIN) 100000 UNIT/ML suspension Take 5 mLs (500,000 Units total) by mouth 4 (four) times daily.   No facility-administered encounter medications on file as of 02/22/2018.     Surgical History: Past Surgical History:  Procedure Laterality Date  . c sections    . COLONOSCOPY N/A 03/08/2015   Procedure: COLONOSCOPY;  Surgeon: Hulen Luster, MD;  Location: [facility] ENDOSCOPY;  Service: Gastroenterology;  Laterality: N/A;  . COLONOSCOPY  02/2015  . FISSURECTOMY N/A 08/24/2017   Procedure: FISSURECTOMY;  Surgeon: Christene Lye, MD;  Location: [facility] ORS;  Service: General;  Laterality: N/A;  . FOOT SURGERY Left 2005  . SPHINCTEROTOMY N/A 08/24/2017   Procedure: SPHINCTEROTOMY;  Surgeon: Christene Lye, MD;  Location: [facility] ORS;  Service: General;  Laterality: N/A;    Medical History: Past Medical History:  Diagnosis Date  . Arthritis   . Depression   . Diabetes mellitus without complication ([place])   . Dysrhythmia    palpitations  . GERD (gastroesophageal reflux disease)    TUMS PRN  . Headache    H/O MIGRAINES  . History of kidney stones   . Hypertension   . Palpitations     Family History: Family History  Problem Relation Age of Onset  . Diabetes Mother   . Hypertension Mother   . Glaucoma Maternal Grandmother   . Alzheimer's disease Maternal Grandmother   . Breast cancer Neg Hx     Social History   Socioeconomic History  . Marital status: Married    Spouse name: Not on file  . Number of children: Not on file  . Years of education: Not on file  . Highest education level: Not on file  Occupational History  . Not on file  Social Needs  . Financial resource strain: Not on file  . Food insecurity:    Worry: Not on file    Inability: Not on file  . Transportation needs:    Medical: Not on file    Non-medical: Not on file  Tobacco  Use  . Smoking status: Former Smoker    Packs/day: 0.50    Years: 12.00    Pack years: 6.00    Types: Cigarettes    Last attempt to quit: 08/20/1985    Years since quitting: 32.5  . Smokeless tobacco: Never Used  Substance and Sexual Activity  . Alcohol use: Yes    Comment: SOCIALLY  . Drug use: No  . Sexual activity: Not on file  Lifestyle  . Physical activity:    Days per week: Not on file    Minutes per session: Not on file  . Stress: Not on file  Relationships  . Social connections:    Talks on phone: Not on file    Gets together: Not on file    Attends religious service: Not on file    Active member of club or organization: Not on file    Attends meetings  of clubs or organizations: Not on file    Relationship status: Not on file  . Intimate partner violence:    Fear of current or ex partner: Not on file    Emotionally abused: Not on file    Physically abused: Not on file    Forced sexual activity: Not on file  Other Topics Concern  . Not on file  Social History Narrative  . Not on file      Review of Systems  Constitutional: Negative for activity change, chills, fatigue and unexpected weight change.  HENT: Negative for congestion, postnasal drip, rhinorrhea, sneezing and sore throat.   Eyes: Negative.  Negative for redness.  Respiratory: Negative for cough, chest tightness, shortness of breath and wheezing.   Cardiovascular: Negative for chest pain and palpitations.  Gastrointestinal: Negative for abdominal pain, constipation, diarrhea, nausea and vomiting.       Improved GERD  Endocrine:       Blood sugars doing well   Genitourinary: Negative for dysuria and frequency.  Musculoskeletal: Negative for arthralgias, back pain, joint swelling and neck pain.       Palpable area of welling at the base of the left side of her neck. Not painful. Not interfering with ROM.   Skin: Negative for rash.  Allergic/Immunologic: Negative for environmental allergies, food  allergies and immunocompromised state.  Neurological: Positive for headaches. Negative for tremors and numbness.  Hematological: Negative for adenopathy. Does not bruise/bleed easily.  Psychiatric/Behavioral: Negative for behavioral problems (Depression), sleep disturbance and suicidal ideas. The patient is not nervous/anxious.     Today's Vitals   02/22/18 1555  BP: 129/76  Pulse: 82  Resp: 16  SpO2: 97%  Weight: 170 lb 9.6 oz (77.4 kg)  Height: 5\' 5"  (1.651 m)    Physical Exam  Constitutional: She is oriented to person, place, and time. She appears well-developed and well-nourished. No distress.  HENT:  Head: Normocephalic and atraumatic.  Mouth/Throat: Oropharynx is clear and moist. No oropharyngeal exudate.  Eyes: Pupils are equal, round, and reactive to light. EOM are normal.  Neck: Normal range of motion. Neck supple. No JVD present. Carotid bruit is not present. No tracheal deviation present. No thyromegaly present.    Cardiovascular: Normal rate, regular rhythm and normal heart sounds. Exam reveals no gallop and no friction rub.  No murmur heard. Pulmonary/Chest: Effort normal and breath sounds normal. No respiratory distress. She has no wheezes. She has no rales. She exhibits no tenderness.  Abdominal: Soft. Bowel sounds are normal. There is no tenderness.  Musculoskeletal: Normal range of motion.  Lymphadenopathy:    She has no cervical adenopathy.  Neurological: She is alert and oriented to person, place, and time. No cranial nerve deficit.  Skin: Skin is warm and dry. She is not diaphoretic.  Psychiatric: She has a normal mood and affect. Her behavior is normal. Judgment and thought content normal.  Nursing note and vitals reviewed.  Assessment/Plan: 1. Type 2 diabetes mellitus without complication, with long-term current use of insulin (HCC) - POCT HgB A1C 6.7 today. Continue diabetic medication without changes today.   2. Type 2 diabetes mellitus with diabetic  neuropathy, without long-term current use of insulin (HCC) neuuropathy worsening at night. Increase gabapentin 300mg  to two capsules in morning and afternoon and three capsules at bedtime. Reassess at next visit.  - gabapentin (NEURONTIN) 300 MG capsule; TAKE 2 CAPSULE BY MOUTH IN THE MORNING AND TAKE 2 CAPSULES IN THE AFTERNOON, AND TAKE 3 CAPSULES IN EVENING.  Dispense: 210 capsule; Refill: 2  3. Oral candidiasis - nystatin (MYCOSTATIN) 100000 UNIT/ML suspension; Take 5 mLs (500,000 Units total) by mouth 4 (four) times daily.  Dispense: 600 mL; Refill: 3  4. Localized swelling, mass or lump of neck Reviewed u/s neck which did not clearly identify fluid filled mass or lesion of any sort. Will get CT neck if symptoms worsen.   5. Gastroesophageal reflux disease without esophagitis Improved. Continue PPI as prescribed.   6. Essential hypertension Stable. Continue bp medication as prescribed.   General Counseling: mixtli reno understanding of the findings of todays visit and agrees with plan of treatment. I have discussed any further diagnostic evaluation that may be needed or ordered today. We also reviewed her medications today. she has been encouraged to call the office with any questions or concerns that should arise related to todays visit.    Counseling: Diabetes Counseling:  1. Addition of ACE inh/ ARB'S for nephroprotection. 2. Diabetic foot care, prevention of complications.  3.Exercise and lose weight.  4. Diabetic eye examination, 5. Monitor blood sugar closlely. nutrition counseling.  6.Sign and symptoms of hypoglycemia including shaking sweating,confusion and headaches.   This patient was seen by Leretha Pol, FNP- C in Collaboration with Dr Lavera Guise as a part of collaborative care agreement   Orders Placed This Encounter  Procedures  . POCT HgB A1C    Meds ordered this encounter  Medications  . gabapentin (NEURONTIN) 300 MG capsule    Sig: TAKE 2  CAPSULE BY MOUTH IN THE MORNING AND TAKE 2 CAPSULES IN THE AFTERNOON, AND TAKE 3 CAPSULES IN EVENING.    Dispense:  210 capsule    Refill:  2    Increased AM dosing to 2 capsules    Order Specific Question:   Supervising Provider    Answer:   Lavera Guise [[id number]]  . nystatin (MYCOSTATIN) 100000 UNIT/ML suspension    Sig: Take 5 mLs (500,000 Units total) by mouth 4 (four) times daily.    Dispense:  600 mL    Refill:  3    Order Specific Question:   Supervising Provider    Answer:   Lavera Guise [[id number]]    Time spent: 69 Minutes          Dr Lavera Guise Internal medicine

## 2018-02-24 ENCOUNTER — Other Ambulatory Visit: Payer: Self-pay | Admitting: Internal Medicine

## 2018-03-11 ENCOUNTER — Other Ambulatory Visit: Payer: Self-pay

## 2018-03-11 MED ORDER — LIRAGLUTIDE 18 MG/3ML ~~LOC~~ SOPN: PEN_INJECTOR | SUBCUTANEOUS | 3 refills | Status: DC

## 2018-03-13 DIAGNOSIS — E1140 Type 2 diabetes mellitus with diabetic neuropathy, unspecified: Secondary | ICD-10-CM | POA: Insufficient documentation

## 2018-03-18 ENCOUNTER — Other Ambulatory Visit: Payer: Self-pay

## 2018-03-18 MED ORDER — AMITRIPTYLINE HCL 10 MG PO TABS: 10.0000 mg | ORAL_TABLET | Freq: Every day | ORAL | 3 refills | Status: DC

## 2018-03-27 ENCOUNTER — Other Ambulatory Visit: Payer: Self-pay | Admitting: Nurse Practitioner

## 2018-03-27 MED ORDER — LOSARTAN POTASSIUM 100 MG PO TABS: 100.0000 mg | ORAL_TABLET | ORAL | 3 refills | Status: DC

## 2018-04-04 ENCOUNTER — Other Ambulatory Visit: Payer: Self-pay | Admitting: Nurse Practitioner

## 2018-04-04 DIAGNOSIS — E1140 Type 2 diabetes mellitus with diabetic neuropathy, unspecified: Secondary | ICD-10-CM

## 2018-04-04 MED ORDER — GABAPENTIN 300 MG PO CAPS: ORAL_CAPSULE | ORAL | 2 refills | Status: DC

## 2018-05-06 ENCOUNTER — Other Ambulatory Visit: Payer: Self-pay | Admitting: Internal Medicine

## 2018-06-19 ENCOUNTER — Other Ambulatory Visit: Payer: Self-pay

## 2018-06-19 MED ORDER — AMITRIPTYLINE HCL 10 MG PO TABS: 10.0000 mg | ORAL_TABLET | Freq: Every day | ORAL | 3 refills | Status: DC

## 2018-06-24 ENCOUNTER — Ambulatory Visit: Payer: BLUE CROSS/BLUE SHIELD | Admitting: Nurse Practitioner

## 2018-06-24 ENCOUNTER — Encounter: Payer: Self-pay | Admitting: Nurse Practitioner

## 2018-06-24 VITALS — BP 120/78 | HR 79 | Resp 16 | Ht 65.0 in | Wt 171.0 lb

## 2018-06-24 DIAGNOSIS — I10 Essential (primary) hypertension: Secondary | ICD-10-CM | POA: Diagnosis not present

## 2018-06-24 DIAGNOSIS — D485 Neoplasm of uncertain behavior of skin: Secondary | ICD-10-CM

## 2018-06-24 DIAGNOSIS — E1165 Type 2 diabetes mellitus with hyperglycemia: Secondary | ICD-10-CM | POA: Diagnosis not present

## 2018-06-24 DIAGNOSIS — K219 Gastro-esophageal reflux disease without esophagitis: Secondary | ICD-10-CM

## 2018-06-24 LAB — POCT GLYCOSYLATED HEMOGLOBIN (HGB A1C): Hemoglobin A1C: 6.8 % — AB (ref 4.0–5.6)

## 2018-06-24 NOTE — Progress Notes (Signed)
[facility] [place], [place] [address]  Internal MEDICINE  Office Visit Note  Patient Name: Belinda Soto  [id number]  [id number]  Date of Service: 06/25/2018  Chief Complaint  Patient presents with  . Diabetes  . Hypertension  . Gastroesophageal Reflux  . Depression  . Quality Metric Gaps    foot exam , eye exam     Diabetes  She presents for her follow-up diabetic visit. She has type 2 diabetes mellitus. No MedicAlert identification noted. Her disease course has been stable. There are no hypoglycemic associated symptoms. Pertinent negatives for hypoglycemia include no dizziness, nervousness/anxiousness or tremors. There are no diabetic associated symptoms. Pertinent negatives for diabetes include no chest pain and no fatigue. There are no hypoglycemic complications. Symptoms are stable. Diabetic complications include peripheral neuropathy. Risk factors for coronary artery disease include dyslipidemia, hypertension and post-menopausal. Current diabetic treatment includes insulin injections and oral agent (monotherapy) (victoza 1.8mg  daily.). She is compliant with treatment all of the time. Her weight is stable. She is following a generally healthy diet. Meal planning includes avoidance of concentrated sweets. She has not had a previous visit with a dietitian. She participates in exercise intermittently. There is no change in her home blood glucose trend. An ACE inhibitor/angiotensin II receptor blocker is being taken. She sees a podiatrist.Eye exam is current.       Current Medication: Outpatient Encounter Medications as of 06/24/2018  Medication Sig  . amitriptyline (ELAVIL) 10 MG tablet Take 1 tablet (10 mg total) by mouth at bedtime.  Marland Kitchen amLODipine (NORVASC) 2.5 MG tablet Take 2.5 mg by mouth every morning.   Marland Kitchen aspirin 81 MG tablet Take 81 mg by mouth daily.  . calcium carbonate (TUMS - DOSED IN MG ELEMENTAL CALCIUM) 500 MG chewable tablet Chew 1 tablet by  mouth as needed for indigestion or heartburn.  . Cholecalciferol (VITAMIN D PO) Take 1,000 Units by mouth daily.   . citalopram (CELEXA) 20 MG tablet TAKE 1 TABLET BY MOUTH ONCE DAILY AROUND  7-8PM  FOR  DEPRESSION  . fluticasone (FLONASE) 50 MCG/ACT nasal spray Place 2 sprays into both nostrils daily.  Marland Kitchen gabapentin (NEURONTIN) 300 MG capsule TAKE 2 CAPSULE BY MOUTH IN THE MORNING AND TAKE 2 CAPSULES IN THE AFTERNOON, AND TAKE 3 CAPSULES IN EVENING. (Patient taking differently: TAKE 1 CAPSULE BY MOUTH IN THE MORNING AND TAKE 2 CAPSULES IN THE AFTERNOON, AND TAKE 3 CAPSULES IN EVENING.)  . glimepiride (AMARYL) 2 MG tablet Take 4 mg by mouth 2 (two) times daily.  . hydrochlorothiazide (HYDRODIURIL) 25 MG tablet Take 1 tablet (25 mg total) by mouth daily.  . insulin detemir (LEVEMIR) 100 UNIT/ML injection Inject 32 Units into the skin at bedtime.  . liraglutide (VICTOZA) 18 MG/3ML SOPN Inject 1.8 mg subcutaneously once daily  . losartan (COZAAR) 100 MG tablet Take 1 tablet (100 mg total) by mouth every morning.  . meloxicam (MOBIC) 7.5 MG tablet Take 1 tablet (7.5 mg total) by mouth 2 (two) times daily.  . metFORMIN (GLUCOPHAGE) 500 MG tablet TAKE 2 TABLETS BY MOUTH TWICE DAILY  . metoprolol tartrate (LOPRESSOR) 100 MG tablet Take 1 tablet (100 mg total) by mouth 2 (two) times daily.  Marland Kitchen omeprazole (PRILOSEC) 20 MG capsule Take 1 capsule (20 mg total) by mouth daily.  . hydrocortisone 2.5 % ointment Apply topically.  . nystatin (MYCOSTATIN) 100000 UNIT/ML suspension Take 5 mLs (500,000 Units total) by mouth 4 (four) times daily. (Patient not taking: Reported on  06/24/2018)  . [DISCONTINUED] amoxicillin-clavulanate (AUGMENTIN) 875-125 MG tablet Take 1 tablet by mouth 2 (two) times daily. (Patient not taking: Reported on 06/24/2018)  . [DISCONTINUED] baclofen (LIORESAL) 10 MG tablet Take 1 tablet (10 mg total) by mouth 3 (three) times daily. (Patient not taking: Reported on 06/24/2018)  . [DISCONTINUED]  ferrous sulfate 325 (65 FE) MG tablet Take 325 mg by mouth daily with breakfast.  . [DISCONTINUED] fluconazole (DIFLUCAN) 150 MG tablet Take 1 tab po once and then repeat in three days if symptoms persit (Patient not taking: Reported on 06/24/2018)  . [DISCONTINUED] LEVEMIR FLEXTOUCH 100 UNIT/ML Pen INJECT 40 UNITS SUBCUTANEOUSLY AT NIGHT   No facility-administered encounter medications on file as of 06/24/2018.     Surgical History: Past Surgical History:  Procedure Laterality Date  . c sections    . COLONOSCOPY N/A 03/08/2015   Procedure: COLONOSCOPY;  Surgeon: Hulen Luster, MD;  Location: [facility] ENDOSCOPY;  Service: Gastroenterology;  Laterality: N/A;  . COLONOSCOPY  02/2015  . FISSURECTOMY N/A 08/24/2017   Procedure: FISSURECTOMY;  Surgeon: Christene Lye, MD;  Location: [facility] ORS;  Service: General;  Laterality: N/A;  . FOOT SURGERY Left 2005  . SPHINCTEROTOMY N/A 08/24/2017   Procedure: SPHINCTEROTOMY;  Surgeon: Christene Lye, MD;  Location: [facility] ORS;  Service: General;  Laterality: N/A;    Medical History: Past Medical History:  Diagnosis Date  . Arthritis   . Depression   . Diabetes mellitus without complication ([place])   . Dysrhythmia    palpitations  . GERD (gastroesophageal reflux disease)    TUMS PRN  . Headache    H/O MIGRAINES  . History of kidney stones   . Hypertension   . Palpitations     Family History: Family History  Problem Relation Age of Onset  . Diabetes Mother   . Hypertension Mother   . Glaucoma Maternal Grandmother   . Alzheimer's disease Maternal Grandmother   . Breast cancer Neg Hx     Social History   Socioeconomic History  . Marital status: Married    Spouse name: Not on file  . Number of children: Not on file  . Years of education: Not on file  . Highest education level: Not on file  Occupational History  . Not on file  Social Needs  . Financial resource strain: Not on file  . Food insecurity:    Worry: Not on file     Inability: Not on file  . Transportation needs:    Medical: Not on file    Non-medical: Not on file  Tobacco Use  . Smoking status: Former Smoker    Packs/day: 0.50    Years: 12.00    Pack years: 6.00    Types: Cigarettes    Last attempt to quit: 08/20/1985    Years since quitting: 32.8  . Smokeless tobacco: Never Used  Substance and Sexual Activity  . Alcohol use: Yes    Comment: SOCIALLY  . Drug use: No  . Sexual activity: Not on file  Lifestyle  . Physical activity:    Days per week: Not on file    Minutes per session: Not on file  . Stress: Not on file  Relationships  . Social connections:    Talks on phone: Not on file    Gets together: Not on file    Attends religious service: Not on file    Active member of club or organization: Not on file    Attends meetings of clubs or  organizations: Not on file    Relationship status: Not on file  . Intimate partner violence:    Fear of current or ex partner: Not on file    Emotionally abused: Not on file    Physically abused: Not on file    Forced sexual activity: Not on file  Other Topics Concern  . Not on file  Social History Narrative  . Not on file      Review of Systems  Constitutional: Negative for activity change, chills, fatigue and unexpected weight change.  HENT: Negative for congestion, postnasal drip, rhinorrhea, sneezing and sore throat.   Eyes: Negative.  Negative for redness.  Respiratory: Negative for cough, chest tightness, shortness of breath and wheezing.   Cardiovascular: Negative for chest pain and palpitations.  Gastrointestinal: Negative for abdominal pain, constipation, diarrhea, nausea and vomiting.       Improved GERD  Endocrine:       Blood sugars doing well   Genitourinary: Negative for dysuria and frequency.  Musculoskeletal: Negative for arthralgias, back pain, joint swelling and neck pain.       Palpable area of welling at the base of the left side of her neck. Not painful. Not  interfering with ROM.   Skin: Negative for rash.  Allergic/Immunologic: Negative for environmental allergies, food allergies and immunocompromised state.  Neurological: Negative for dizziness, tremors and numbness.  Hematological: Negative for adenopathy. Does not bruise/bleed easily.  Psychiatric/Behavioral: Negative for behavioral problems (Depression), sleep disturbance and suicidal ideas. The patient is not nervous/anxious.     Today's Vitals   06/24/18 1553  BP: 120/78  Pulse: 79  Resp: 16  SpO2: 95%  Weight: 171 lb (77.6 kg)  Height: 5\' 5"  (1.651 m)    Physical Exam  Constitutional: She is oriented to person, place, and time. She appears well-developed and well-nourished. No distress.  HENT:  Head: Normocephalic and atraumatic.  Nose: Nose normal.  Mouth/Throat: Oropharynx is clear and moist. No oropharyngeal exudate.  Eyes: Pupils are equal, round, and reactive to light. Conjunctivae and EOM are normal.  Neck: Normal range of motion. Neck supple. No JVD present. No tracheal deviation present. No thyromegaly present.  Cardiovascular: Normal rate, regular rhythm and normal heart sounds. Exam reveals no gallop and no friction rub.  No murmur heard. Pulmonary/Chest: Effort normal and breath sounds normal. No respiratory distress. She has no wheezes. She has no rales. She exhibits no tenderness.  Abdominal: Soft. Bowel sounds are normal. There is no tenderness.  Musculoskeletal: Normal range of motion.  Lymphadenopathy:    She has no cervical adenopathy.  Neurological: She is alert and oriented to person, place, and time. No cranial nerve deficit.  Skin: Skin is warm and dry. She is not diaphoretic.  There are several, very small, white, round lesions on the face. They are slightly raised from surrounding skin. They are non-tender and there is no evidence in inflammation or infection.   Psychiatric: She has a normal mood and affect. Her behavior is normal. Judgment and thought  content normal.  Nursing note and vitals reviewed.  Assessment/Plan: 1. Uncontrolled type 2 diabetes mellitus with hyperglycemia (HCC) - POCT HgB A1C 6.8 today. No changes made to diabetic medications. Refer for diabetic eye exam . - Ambulatory referral to Ophthalmology  2. Neoplasm of uncertain behavior of skin of face Several small areas on the face she would like to have evaluated. Refer to dermatology for further evaluation.  - Ambulatory referral to Dermatology  3. Essential hypertension Stable.  Continue bp medications as prescribed.   4. Gastroesophageal reflux disease without esophagitis Well managed.   General Counseling: raschelle wisenbaker understanding of the findings of todays visit and agrees with plan of treatment. I have discussed any further diagnostic evaluation that may be needed or ordered today. We also reviewed her medications today. she has been encouraged to call the office with any questions or concerns that should arise related to todays visit.  Diabetes Counseling:  1. Addition of ACE inh/ ARB'S for nephroprotection. Microalbumin is updated  2. Diabetic foot care, prevention of complications. Podiatry consult 3. Exercise and lose weight.  4. Diabetic eye examination, Diabetic eye exam is updated  5. Monitor blood sugar closlely. nutrition counseling.  6. Sign and symptoms of hypoglycemia including shaking sweating,confusion and headaches.  This patient was seen by Leretha Pol FNP Collaboration with Dr Lavera Guise as a part of collaborative care agreement   Orders Placed This Encounter  Procedures  . Ambulatory referral to Ophthalmology  . Ambulatory referral to Dermatology  . POCT HgB A1C      Time spent: 15 Minutes      Dr Lavera Guise Internal medicine

## 2018-06-25 DIAGNOSIS — D485 Neoplasm of uncertain behavior of skin: Secondary | ICD-10-CM | POA: Insufficient documentation

## 2018-06-27 ENCOUNTER — Other Ambulatory Visit: Payer: Self-pay | Admitting: Internal Medicine

## 2018-07-04 DIAGNOSIS — Z23 Encounter for immunization: Secondary | ICD-10-CM | POA: Diagnosis not present

## 2018-07-08 ENCOUNTER — Other Ambulatory Visit: Payer: Self-pay

## 2018-07-08 MED ORDER — AMITRIPTYLINE HCL 10 MG PO TABS: 10.0000 mg | ORAL_TABLET | Freq: Every day | ORAL | 3 refills | Status: DC

## 2018-07-09 ENCOUNTER — Telehealth: Payer: Self-pay

## 2018-07-09 NOTE — Telephone Encounter (Signed)
Spoke with as per heather try to meloxicam 7.5 mg take 2 tab once daily and let us known how she is doing

## 2018-07-09 NOTE — Telephone Encounter (Signed)
She can take two meloxicam together. Or I can change to something different. Which would she prefer to do?

## 2018-07-26 ENCOUNTER — Other Ambulatory Visit: Payer: Self-pay

## 2018-07-26 DIAGNOSIS — K219 Gastro-esophageal reflux disease without esophagitis: Secondary | ICD-10-CM

## 2018-07-26 MED ORDER — OMEPRAZOLE 20 MG PO CPDR: 20.0000 mg | DELAYED_RELEASE_CAPSULE | Freq: Every day | ORAL | 3 refills | Status: DC

## 2018-08-06 ENCOUNTER — Other Ambulatory Visit: Payer: Self-pay | Admitting: Internal Medicine

## 2018-08-06 DIAGNOSIS — Z1231 Encounter for screening mammogram for malignant neoplasm of breast: Secondary | ICD-10-CM

## 2018-08-08 ENCOUNTER — Other Ambulatory Visit: Payer: Self-pay | Admitting: Internal Medicine

## 2018-08-08 ENCOUNTER — Other Ambulatory Visit: Payer: Self-pay

## 2018-08-08 DIAGNOSIS — K219 Gastro-esophageal reflux disease without esophagitis: Secondary | ICD-10-CM

## 2018-08-08 MED ORDER — OMEPRAZOLE 20 MG PO CPDR: 20.0000 mg | DELAYED_RELEASE_CAPSULE | Freq: Every day | ORAL | 3 refills | Status: DC

## 2018-08-09 ENCOUNTER — Other Ambulatory Visit: Payer: Self-pay

## 2018-08-09 MED ORDER — METOPROLOL TARTRATE 100 MG PO TABS: 100.0000 mg | ORAL_TABLET | Freq: Two times a day (BID) | ORAL | 6 refills | Status: DC

## 2018-08-09 MED ORDER — MELOXICAM 7.5 MG PO TABS: 7.5000 mg | ORAL_TABLET | Freq: Two times a day (BID) | ORAL | 5 refills | Status: DC

## 2018-08-14 ENCOUNTER — Ambulatory Visit
Admission: RE | Admit: 2018-08-14 | Discharge: 2018-08-14 | Disposition: A | Discharge status: Home / Self Care | Payer: BLUE CROSS/BLUE SHIELD | Source: Ambulatory Visit | Attending: Internal Medicine | Admitting: Internal Medicine

## 2018-08-14 DIAGNOSIS — Z1231 Encounter for screening mammogram for malignant neoplasm of breast: Secondary | ICD-10-CM | POA: Insufficient documentation

## 2018-08-19 ENCOUNTER — Other Ambulatory Visit: Payer: Self-pay | Admitting: Internal Medicine

## 2018-09-11 ENCOUNTER — Other Ambulatory Visit: Payer: Self-pay

## 2018-09-11 MED ORDER — CITALOPRAM HYDROBROMIDE 20 MG PO TABS: ORAL_TABLET | ORAL | 3 refills | Status: DC

## 2018-09-16 ENCOUNTER — Other Ambulatory Visit: Payer: Self-pay

## 2018-09-16 MED ORDER — LIRAGLUTIDE 18 MG/3ML ~~LOC~~ SOPN: PEN_INJECTOR | SUBCUTANEOUS | 3 refills | Status: DC

## 2018-09-27 ENCOUNTER — Other Ambulatory Visit: Payer: Self-pay | Admitting: Internal Medicine

## 2018-10-07 ENCOUNTER — Other Ambulatory Visit: Payer: Self-pay | Admitting: Nurse Practitioner

## 2018-10-07 DIAGNOSIS — E1140 Type 2 diabetes mellitus with diabetic neuropathy, unspecified: Secondary | ICD-10-CM

## 2018-10-07 MED ORDER — GABAPENTIN 300 MG PO CAPS: ORAL_CAPSULE | ORAL | 2 refills | Status: DC

## 2018-11-05 ENCOUNTER — Other Ambulatory Visit: Payer: Self-pay | Admitting: Nurse Practitioner

## 2018-11-07 DIAGNOSIS — I8393 Asymptomatic varicose veins of bilateral lower extremities: Secondary | ICD-10-CM | POA: Diagnosis not present

## 2018-11-07 DIAGNOSIS — L812 Freckles: Secondary | ICD-10-CM | POA: Diagnosis not present

## 2018-11-07 DIAGNOSIS — L720 Epidermal cyst: Secondary | ICD-10-CM | POA: Diagnosis not present

## 2018-11-07 DIAGNOSIS — L821 Other seborrheic keratosis: Secondary | ICD-10-CM | POA: Diagnosis not present

## 2018-11-14 ENCOUNTER — Other Ambulatory Visit: Payer: Self-pay

## 2018-11-14 ENCOUNTER — Other Ambulatory Visit: Payer: Self-pay | Admitting: Nurse Practitioner

## 2018-11-14 MED ORDER — METFORMIN HCL 500 MG PO TABS: 1000.0000 mg | ORAL_TABLET | Freq: Two times a day (BID) | ORAL | 0 refills | Status: DC

## 2018-11-20 ENCOUNTER — Other Ambulatory Visit: Payer: Self-pay | Admitting: Nurse Practitioner

## 2018-12-03 ENCOUNTER — Ambulatory Visit: Payer: BC Managed Care – PPO | Admitting: Adult Health

## 2018-12-03 ENCOUNTER — Encounter: Payer: Self-pay | Admitting: Adult Health

## 2018-12-03 VITALS — BP 116/80 | HR 95 | Temp 98.5°F | Resp 16 | Ht 65.0 in | Wt 167.0 lb

## 2018-12-03 DIAGNOSIS — H1031 Unspecified acute conjunctivitis, right eye: Secondary | ICD-10-CM | POA: Diagnosis not present

## 2018-12-03 DIAGNOSIS — H00011 Hordeolum externum right upper eyelid: Secondary | ICD-10-CM

## 2018-12-03 MED ORDER — BACITRACIN 500 UNIT/GM OP OINT: 1.0000 "application " | TOPICAL_OINTMENT | Freq: Four times a day (QID) | OPHTHALMIC | 0 refills | Status: DC

## 2018-12-03 MED ORDER — GENTAMICIN SULFATE 0.3 % OP SOLN: 2.0000 [drp] | OPHTHALMIC | 0 refills | Status: DC

## 2018-12-03 NOTE — Patient Instructions (Signed)

## 2018-12-03 NOTE — Progress Notes (Signed)
[facility] [place], [place] [address]  Internal MEDICINE  Office Visit Note  Patient Name: Belinda Soto  [id number]  [id number]  Date of Service: 12/03/2018  Chief Complaint  Patient presents with  . Eye Drainage    right eye , started friday evening      HPI Pt is here for a sick visit. Pt reports right eye swelling, itching, and purulent drainage x 4 days.  She reports her eye lashes were stuck shut when she woke up this morning.  She noticed swelling that is increasing and redness of the right eye.     Current Medication:  Outpatient Encounter Medications as of 12/03/2018  Medication Sig  . amitriptyline (ELAVIL) 10 MG tablet Take 1 tablet (10 mg total) by mouth at bedtime.  Marland Kitchen amLODipine (NORVASC) 2.5 MG tablet TAKE 1 TABLET BY MOUTH ONCE DAILY  . aspirin 81 MG tablet Take 81 mg by mouth daily.  . calcium carbonate (TUMS - DOSED IN MG ELEMENTAL CALCIUM) 500 MG chewable tablet Chew 1 tablet by mouth as needed for indigestion or heartburn.  . Cholecalciferol (VITAMIN D PO) Take 1,000 Units by mouth daily.   . citalopram (CELEXA) 20 MG tablet TAKE 1 TABLET BY MOUTH ONCE DAILY AROUND  7-8PM  FOR  DEPRESSION  . fluticasone (FLONASE) 50 MCG/ACT nasal spray Place 2 sprays into both nostrils daily.  Marland Kitchen gabapentin (NEURONTIN) 300 MG capsule TAKE 2 CAPSULE BY MOUTH IN THE MORNING AND TAKE 2 CAPSULES IN THE AFTERNOON, AND TAKE 3 CAPSULES IN EVENING.  Marland Kitchen glimepiride (AMARYL) 2 MG tablet TAKE 1 TABLET BY MOUTH TWICE DAILY  . hydrochlorothiazide (HYDRODIURIL) 25 MG tablet TAKE 1 TABLET BY MOUTH IN THE MORNING  . hydrocortisone 2.5 % ointment Apply topically.  . insulin detemir (LEVEMIR) 100 UNIT/ML injection Inject 32 Units into the skin at bedtime.  . liraglutide (VICTOZA) 18 MG/3ML SOPN Inject 1.8 mg subcutaneously once daily  . losartan (COZAAR) 100 MG tablet Take 1 tablet (100 mg total) by mouth every morning.  . meloxicam (MOBIC) 7.5 MG tablet Take 1 tablet  (7.5 mg total) by mouth 2 (two) times daily.  . metFORMIN (GLUCOPHAGE) 500 MG tablet Take 2 tablets (1,000 mg total) by mouth 2 (two) times daily.  . metoprolol tartrate (LOPRESSOR) 100 MG tablet Take 1 tablet (100 mg total) by mouth 2 (two) times daily.  Marland Kitchen NOVOFINE 32G X 6 MM MISC USE WITH VICTOZA ONCE DAILY  . nystatin (MYCOSTATIN) 100000 UNIT/ML suspension Take 5 mLs (500,000 Units total) by mouth 4 (four) times daily.  Marland Kitchen omeprazole (PRILOSEC) 20 MG capsule Take 1 capsule (20 mg total) by mouth daily.   No facility-administered encounter medications on file as of 12/03/2018.       Medical History: Past Medical History:  Diagnosis Date  . Arthritis   . Depression   . Diabetes mellitus without complication ([place])   . Dysrhythmia    palpitations  . GERD (gastroesophageal reflux disease)    TUMS PRN  . Headache    H/O MIGRAINES  . History of kidney stones   . Hypertension   . Palpitations      Vital Signs: BP 116/80   Pulse 95   Temp 98.5 F (36.9 C) (Oral)   Resp 16   Ht 5\' 5"  (1.651 m)   Wt 167 lb (75.8 kg)   SpO2 94%   BMI 27.79 kg/m    Review of Systems  Constitutional: Negative for chills, fatigue and unexpected  weight change.  HENT: Negative for congestion, rhinorrhea, sneezing and sore throat.   Eyes: Negative for photophobia, pain and redness.  Respiratory: Negative for cough, chest tightness and shortness of breath.   Cardiovascular: Negative for chest pain and palpitations.  Gastrointestinal: Negative for abdominal pain, constipation, diarrhea, nausea and vomiting.  Endocrine: Negative.   Genitourinary: Negative for dysuria and frequency.  Musculoskeletal: Negative for arthralgias, back pain, joint swelling and neck pain.  Skin: Negative for rash.  Allergic/Immunologic: Negative.   Neurological: Negative for tremors and numbness.  Hematological: Negative for adenopathy. Does not bruise/bleed easily.  Psychiatric/Behavioral: Negative for behavioral  problems and sleep disturbance. The patient is not nervous/anxious.     Physical Exam Vitals signs and nursing note reviewed.  Constitutional:      General: She is not in acute distress.    Appearance: She is well-developed. She is not diaphoretic.  HENT:     Head: Normocephalic and atraumatic.     Mouth/Throat:     Pharynx: No oropharyngeal exudate.  Eyes:     Pupils: Pupils are equal, round, and reactive to light.  Neck:     Musculoskeletal: Normal range of motion and neck supple.     Thyroid: No thyromegaly.     Vascular: No JVD.     Trachea: No tracheal deviation.  Cardiovascular:     Rate and Rhythm: Normal rate and regular rhythm.     Heart sounds: Normal heart sounds. No murmur. No friction rub. No gallop.   Pulmonary:     Effort: Pulmonary effort is normal. No respiratory distress.     Breath sounds: Normal breath sounds. No wheezing or rales.  Chest:     Chest wall: No tenderness.  Abdominal:     Palpations: Abdomen is soft.     Tenderness: There is no abdominal tenderness. There is no guarding.  Musculoskeletal: Normal range of motion.  Lymphadenopathy:     Cervical: No cervical adenopathy.  Skin:    General: Skin is warm and dry.  Neurological:     Mental Status: She is alert and oriented to person, place, and time.     Cranial Nerves: No cranial nerve deficit.  Psychiatric:        Behavior: Behavior normal.        Thought Content: Thought content normal.        Judgment: Judgment normal.     Assessment/Plan: 1. Hordeolum externum of right upper eyelid Instructed patient to use warm compresses on affected eye.  Prescribed bacitracin ophthalmic ointment to apply to area.  Instructed her to see an eye doctor if this does not solve her symptoms. - bacitracin ophthalmic ointment; Place 1 application into the right eye 4 (four) times daily. apply to right eye  Dispense: 3.5 g; Refill: 0  2. Acute bacterial conjunctivitis of right eye Gentamicin drops provided  for conjunctivitis of this right.  We discussed using drops until symptoms resolve and then using the drops through that day as well.  Patient verbalized understanding. - gentamicin (GARAMYCIN) 0.3 % ophthalmic solution; Place 2 drops into both eyes every 4 (four) hours.  Dispense: 5 mL; Refill: 0  General Counseling: shaleka brines understanding of the findings of todays visit and agrees with plan of treatment. I have discussed any further diagnostic evaluation that may be needed or ordered today. We also reviewed her medications today. she has been encouraged to call the office with any questions or concerns that should arise related to todays visit.   No  orders of the defined types were placed in this encounter.   No orders of the defined types were placed in this encounter.   Time spent: 25 Minutes  This patient was seen by Orson Gear AGNP-C in Collaboration with Dr Lavera Guise as a part of collaborative care agreement.  Kendell Bane AGNP-C Internal Medicine

## 2018-12-08 ENCOUNTER — Encounter: Payer: Self-pay | Admitting: Adult Health

## 2018-12-24 ENCOUNTER — Other Ambulatory Visit: Payer: Self-pay | Admitting: Adult Health

## 2018-12-24 ENCOUNTER — Other Ambulatory Visit: Payer: Self-pay | Admitting: Internal Medicine

## 2018-12-24 MED ORDER — AMLODIPINE BESYLATE 2.5 MG PO TABS: 2.5000 mg | ORAL_TABLET | Freq: Every day | ORAL | 1 refills | Status: DC

## 2018-12-24 MED ORDER — HYDROCHLOROTHIAZIDE 25 MG PO TABS: 25.0000 mg | ORAL_TABLET | Freq: Every morning | ORAL | 1 refills | Status: DC

## 2019-01-08 ENCOUNTER — Other Ambulatory Visit: Payer: Self-pay | Admitting: Nurse Practitioner

## 2019-01-08 DIAGNOSIS — E1140 Type 2 diabetes mellitus with diabetic neuropathy, unspecified: Secondary | ICD-10-CM

## 2019-01-08 MED ORDER — GABAPENTIN 300 MG PO CAPS: ORAL_CAPSULE | ORAL | 2 refills | Status: DC

## 2019-01-13 ENCOUNTER — Other Ambulatory Visit: Payer: Self-pay

## 2019-01-13 MED ORDER — CITALOPRAM HYDROBROMIDE 20 MG PO TABS: ORAL_TABLET | ORAL | 1 refills | Status: DC

## 2019-01-20 ENCOUNTER — Other Ambulatory Visit: Payer: Self-pay

## 2019-01-20 MED ORDER — INSULIN DETEMIR 100 UNIT/ML ~~LOC~~ SOLN: 32.0000 [IU] | Freq: Every day | SUBCUTANEOUS | 3 refills | Status: DC

## 2019-01-24 ENCOUNTER — Other Ambulatory Visit: Payer: Self-pay

## 2019-01-24 MED ORDER — INSULIN DETEMIR 100 UNIT/ML FLEXPEN: 32.0000 [IU] | PEN_INJECTOR | Freq: Every day | SUBCUTANEOUS | 1 refills | Status: DC

## 2019-01-31 ENCOUNTER — Encounter: Payer: Self-pay | Admitting: Nurse Practitioner

## 2019-01-31 ENCOUNTER — Other Ambulatory Visit: Payer: Self-pay

## 2019-01-31 ENCOUNTER — Ambulatory Visit (INDEPENDENT_AMBULATORY_CARE_PROVIDER_SITE_OTHER): Payer: BC Managed Care – PPO | Admitting: Nurse Practitioner

## 2019-01-31 VITALS — BP 127/67 | HR 92 | Resp 16 | Ht 65.0 in | Wt 172.0 lb

## 2019-01-31 DIAGNOSIS — E1140 Type 2 diabetes mellitus with diabetic neuropathy, unspecified: Secondary | ICD-10-CM | POA: Diagnosis not present

## 2019-01-31 DIAGNOSIS — Z0001 Encounter for general adult medical examination with abnormal findings: Secondary | ICD-10-CM

## 2019-01-31 DIAGNOSIS — R300 Dysuria: Secondary | ICD-10-CM

## 2019-01-31 DIAGNOSIS — Z124 Encounter for screening for malignant neoplasm of cervix: Secondary | ICD-10-CM

## 2019-01-31 DIAGNOSIS — I10 Essential (primary) hypertension: Secondary | ICD-10-CM | POA: Diagnosis not present

## 2019-01-31 DIAGNOSIS — E1165 Type 2 diabetes mellitus with hyperglycemia: Secondary | ICD-10-CM | POA: Diagnosis not present

## 2019-01-31 LAB — POCT GLYCOSYLATED HEMOGLOBIN (HGB A1C): Hemoglobin A1C: 6.3 % — AB (ref 4.0–5.6)

## 2019-01-31 MED ORDER — GABAPENTIN 600 MG PO TABS: ORAL_TABLET | ORAL | 2 refills | Status: DC

## 2019-01-31 NOTE — Progress Notes (Signed)
[facility] [place], [place] [address]  Internal MEDICINE  Office Visit Note  Patient Name: Belinda Soto  [id number]  [id number]  Date of Service: 02/19/2019   Pt is here for routine health maintenance examination   Chief Complaint  Patient presents with  . Annual Exam  . Gynecologic Exam  . Hypertension  . Diabetes    A1C  . Depression  . Gastroesophageal Reflux  . Quality Metric Gaps    foot exam, pneumonia vaccine due     The patient states that she is having increased neuropathy in her feet. At night, espcecialy, they burn. Even worse when she allows sheets o rclotihng touch the skin of her feet. She is currently taking gabapentin 300mg , two capsules in the morning and in afternoon, and three in the evenings. She states that blood sugars are wll controlled. Blood pressure is doing well.   Diabetes  She presents for her follow-up diabetic visit. She has type 2 diabetes mellitus. No MedicAlert identification noted. Her disease course has been stable. There are no hypoglycemic associated symptoms. Pertinent negatives for hypoglycemia include no dizziness, nervousness/anxiousness or tremors. There are no diabetic associated symptoms. Pertinent negatives for diabetes include no chest pain, no fatigue, no polydipsia and no polyuria. There are no hypoglycemic complications. Symptoms are stable. Diabetic complications include peripheral neuropathy. Risk factors for coronary artery disease include dyslipidemia, hypertension and post-menopausal. Current diabetic treatment includes insulin injections and oral agent (monotherapy) (victoza 1.8mg  daily.). She is compliant with treatment all of the time. Her weight is stable. She is following a generally healthy diet. Meal planning includes avoidance of concentrated sweets. She has not had a previous visit with a dietitian. She participates in exercise intermittently. There is no change in her home blood glucose trend.  An ACE inhibitor/angiotensin II receptor blocker is being taken. She sees a podiatrist.Eye exam is current.    Current Medication: Outpatient Encounter Medications as of 01/31/2019  Medication Sig  . amitriptyline (ELAVIL) 10 MG tablet Take 1 tablet (10 mg total) by mouth at bedtime.  Marland Kitchen amLODipine (NORVASC) 2.5 MG tablet Take 1 tablet (2.5 mg total) by mouth daily.  Marland Kitchen aspirin 81 MG tablet Take 81 mg by mouth daily.  . bacitracin ophthalmic ointment Place 1 application into the right eye 4 (four) times daily. apply to right eye  . calcium carbonate (TUMS - DOSED IN MG ELEMENTAL CALCIUM) 500 MG chewable tablet Chew 1 tablet by mouth as needed for indigestion or heartburn.  . Cholecalciferol (VITAMIN D PO) Take 1,000 Units by mouth daily.   . citalopram (CELEXA) 20 MG tablet TAKE 1 TABLET BY MOUTH ONCE DAILY AROUND  7-8PM  FOR  DEPRESSION  . fluticasone (FLONASE) 50 MCG/ACT nasal spray Place 2 sprays into both nostrils daily.  Marland Kitchen gentamicin (GARAMYCIN) 0.3 % ophthalmic solution Place 2 drops into both eyes every 4 (four) hours.  . hydrochlorothiazide (HYDRODIURIL) 25 MG tablet Take 1 tablet (25 mg total) by mouth every morning.  . hydrocortisone 2.5 % ointment Apply topically.  . Insulin Detemir (LEVEMIR FLEXTOUCH) 100 UNIT/ML Pen Inject 32 Units into the skin at bedtime.  Marland Kitchen losartan (COZAAR) 100 MG tablet Take 1 tablet (100 mg total) by mouth every morning.  . meloxicam (MOBIC) 7.5 MG tablet Take 1 tablet (7.5 mg total) by mouth 2 (two) times daily.  . metFORMIN (GLUCOPHAGE) 500 MG tablet Take 2 tablets (1,000 mg total) by mouth 2 (two) times daily.  Marland Kitchen NOVOFINE 32G X 6  MM MISC USE WITH VICTOZA ONCE DAILY  . nystatin (MYCOSTATIN) 100000 UNIT/ML suspension Take 5 mLs (500,000 Units total) by mouth 4 (four) times daily.  Marland Kitchen omeprazole (PRILOSEC) 20 MG capsule Take 1 capsule (20 mg total) by mouth daily.  . [DISCONTINUED] gabapentin (NEURONTIN) 300 MG capsule TAKE 2 CAPSULE BY MOUTH IN THE MORNING AND  TAKE 2 CAPSULES IN THE AFTERNOON, AND TAKE 3 CAPSULES IN EVENING.  . [DISCONTINUED] glimepiride (AMARYL) 2 MG tablet TAKE 1 TABLET BY MOUTH TWICE DAILY  . [DISCONTINUED] liraglutide (VICTOZA) 18 MG/3ML SOPN Inject 1.8 mg subcutaneously once daily  . [DISCONTINUED] metoprolol tartrate (LOPRESSOR) 100 MG tablet Take 1 tablet (100 mg total) by mouth 2 (two) times daily.  Marland Kitchen gabapentin (NEURONTIN) 600 MG tablet Take 1.5 tablets qam and qpm and 2 tablets po QHS   No facility-administered encounter medications on file as of 01/31/2019.     Surgical History: Past Surgical History:  Procedure Laterality Date  . c sections    . COLONOSCOPY N/A 03/08/2015   Procedure: COLONOSCOPY;  Surgeon: Hulen Luster, MD;  Location: [facility] ENDOSCOPY;  Service: Gastroenterology;  Laterality: N/A;  . COLONOSCOPY  02/2015  . FISSURECTOMY N/A 08/24/2017   Procedure: FISSURECTOMY;  Surgeon: Christene Lye, MD;  Location: [facility] ORS;  Service: General;  Laterality: N/A;  . FOOT SURGERY Left 2005  . SPHINCTEROTOMY N/A 08/24/2017   Procedure: SPHINCTEROTOMY;  Surgeon: Christene Lye, MD;  Location: [facility] ORS;  Service: General;  Laterality: N/A;    Medical History: Past Medical History:  Diagnosis Date  . Arthritis   . Depression   . Diabetes mellitus without complication ([place])   . Dysrhythmia    palpitations  . GERD (gastroesophageal reflux disease)    TUMS PRN  . Headache    H/O MIGRAINES  . History of kidney stones   . Hypertension   . Palpitations     Family History: Family History  Problem Relation Age of Onset  . Diabetes Mother   . Hypertension Mother   . Glaucoma Maternal Grandmother   . Alzheimer's disease Maternal Grandmother   . Breast cancer Neg Hx       Review of Systems  Constitutional: Negative for activity change, chills, fatigue and unexpected weight change.  HENT: Negative for congestion, postnasal drip, rhinorrhea, sneezing and sore throat.   Respiratory: Negative for  cough, chest tightness, shortness of breath and wheezing.   Cardiovascular: Negative for chest pain and palpitations.  Gastrointestinal: Negative for abdominal pain, constipation, diarrhea, nausea and vomiting.  Endocrine: Negative for cold intolerance, heat intolerance, polydipsia and polyuria.       Blood sugars doing well   Genitourinary: Negative for dysuria and frequency.  Musculoskeletal: Negative for arthralgias, back pain, joint swelling and neck pain.  Skin: Negative for rash.  Allergic/Immunologic: Negative for environmental allergies, food allergies and immunocompromised state.  Neurological: Positive for numbness. Negative for dizziness and tremors.       Burning sensation in both feet.   Hematological: Negative for adenopathy. Does not bruise/bleed easily.  Psychiatric/Behavioral: Negative for behavioral problems (Depression), sleep disturbance and suicidal ideas. The patient is not nervous/anxious.      Today's Vitals   01/31/19 1447  BP: 127/67  Pulse: 92  Resp: 16  SpO2: 95%  Weight: 172 lb (78 kg)  Height: 5\' 5"  (1.651 m)   Body mass index is 28.62 kg/m.  Physical Exam Vitals signs and nursing note reviewed.  Constitutional:      General: She is  not in acute distress.    Appearance: Normal appearance. She is well-developed. She is not diaphoretic.  HENT:     Head: Normocephalic and atraumatic.     Nose: Nose normal.     Mouth/Throat:     Pharynx: No oropharyngeal exudate.  Eyes:     Conjunctiva/sclera: Conjunctivae normal.     Pupils: Pupils are equal, round, and reactive to light.  Neck:     Musculoskeletal: Normal range of motion and neck supple.     Thyroid: No thyromegaly.     Vascular: No JVD.     Trachea: No tracheal deviation.  Cardiovascular:     Rate and Rhythm: Normal rate and regular rhythm.     Pulses: Normal pulses.          Dorsalis pedis pulses are 2+ on the right side and 2+ on the left side.       Posterior tibial pulses are 2+ on  the right side and 2+ on the left side.     Heart sounds: Normal heart sounds. No murmur. No friction rub. No gallop.   Pulmonary:     Effort: Pulmonary effort is normal. No respiratory distress.     Breath sounds: Normal breath sounds. No wheezing or rales.  Chest:     Chest wall: No tenderness.     Breasts:        Right: No swelling, bleeding, inverted nipple, mass, nipple discharge, skin change or tenderness.        Left: No swelling, bleeding, inverted nipple, mass, nipple discharge, skin change or tenderness.  Abdominal:     General: Bowel sounds are normal.     Palpations: Abdomen is soft.     Tenderness: There is no abdominal tenderness.     Hernia: There is no hernia in the right inguinal area or left inguinal area.  Genitourinary:    Exam position: Supine.     Labia:        Right: No tenderness or lesion.        Left: No tenderness or lesion.      Vagina: Normal. No vaginal discharge, erythema, tenderness or bleeding.     Cervix: No cervical motion tenderness, discharge, erythema or cervical bleeding.     Uterus: Normal.      Adnexa: Right adnexa normal and left adnexa normal.     Comments: No tenderness, masses, or organomeglay present during bimanual exam . Musculoskeletal: Normal range of motion.     Right foot: Normal range of motion.     Left foot: Normal range of motion.  Feet:     Right foot:     Protective Sensation: 10 sites tested. 5 sites sensed.     Skin integrity: Skin integrity normal.     Toenail Condition: Right toenails are normal.     Left foot:     Protective Sensation: 10 sites tested. 5 sites sensed.     Skin integrity: Skin integrity normal.     Toenail Condition: Left toenails are normal.  Lymphadenopathy:     Cervical: No cervical adenopathy.     Lower Body: No right inguinal adenopathy. No left inguinal adenopathy.  Skin:    General: Skin is warm and dry.     Comments: There are several, very small, white, round lesions on the face. They are  slightly raised from surrounding skin. They are non-tender and there is no evidence in inflammation or infection.   Neurological:     Mental Status: She is  alert and oriented to person, place, and time.     Cranial Nerves: No cranial nerve deficit.  Psychiatric:        Behavior: Behavior normal.        Thought Content: Thought content normal.        Judgment: Judgment normal.      LABS: Recent Results (from the past 2160 hour(s))  Pap IG and HPV (high risk) DNA detection     Status: None   Collection Time: 01/31/19  2:40 PM  Result Value Ref Range   Interpretation NILM     Comment: NEGATIVE FOR INTRAEPITHELIAL LESION OR MALIGNANCY.   Category NIL     Comment: Negative for Intraepithelial Lesion   Adequacy ENDO     Comment: Satisfactory for evaluation. Endocervical and/or squamous metaplastic cells (endocervical component) are present.    Clinician Provided ICD10 Comment     Comment: Z12.4   Performed by: Comment     Comment: Joesphine Bare, Cytotechnologist (ASCP)   Note: Comment     Comment: The Pap smear is a screening test designed to aid in the detection of premalignant and malignant conditions of the uterine cervix.  It is not a diagnostic procedure and should not be used as the sole means of detecting cervical cancer.  Both false-positive and false-negative reports do occur.    Test Methodology Comment     Comment: This liquid based ThinPrep(R) pap test was screened with the use of an image guided system.    HPV, high-risk Negative Negative    Comment: This nucleic acid amplification high-risk HPV test detects thirteen high-risk types (16,18,31,33,35,39,45,51,52,56,58,59,68) without differentiation.   POCT HgB A1C     Status: Abnormal   Collection Time: 01/31/19  3:01 PM  Result Value Ref Range   Hemoglobin A1C 6.3 (A) 4.0 - 5.6 %   HbA1c POC (<> result, manual entry)     HbA1c, POC (prediabetic range)     HbA1c, POC (controlled diabetic range)    UA/M  w/rflx Culture, Routine     Status: Abnormal   Collection Time: 01/31/19  3:46 PM  Result Value Ref Range   Specific Gravity, UA 1.026 1.005 - 1.030   pH, UA 5.0 5.0 - 7.5   Color, UA Yellow Yellow   Appearance Ur Clear Clear   Leukocytes,UA 1+ (A) Negative   Protein,UA Negative Negative/Trace   Glucose, UA 2+ (A) Negative   Ketones, UA Trace (A) Negative   RBC, UA 2+ (A) Negative   Bilirubin, UA Negative Negative   Urobilinogen, Ur 0.2 0.2 - 1.0 mg/dL   Nitrite, UA Positive (A) Negative   Microscopic Examination See below:     Comment: Microscopic was indicated and was performed.   Urinalysis Reflex Comment     Comment: This specimen has reflexed to a Urine Culture.  Microscopic Examination     Status: Abnormal   Collection Time: 01/31/19  3:46 PM  Result Value Ref Range   WBC, UA 6-10 (A) 0 - 5 /hpf   RBC 3-10 (A) 0 - 2 /hpf   Epithelial Cells (non renal) 0-10 0 - 10 /hpf   Casts None seen None seen /lpf   Crystals Present (A) N/A   Crystal Type Amorphous Sediment N/A   Mucus, UA Present Not Estab.   Bacteria, UA Few None seen/Few  Urine Culture, Reflex     Status: Abnormal   Collection Time: 01/31/19  3:46 PM  Result Value Ref Range   Urine Culture, Routine Final  report (A)    Organism ID, Bacteria Comment (A)     Comment: Staphylococcus epidermidis Greater than 100,000 colony forming units per mL    Antimicrobial Susceptibility Comment     Comment:       ** S = Susceptible; I = Intermediate; R = Resistant **                    P = Positive; N = Negative             MICS are expressed in micrograms per mL    Antibiotic                 RSLT#1    RSLT#2    RSLT#3    RSLT#4 Ciprofloxacin                  S Gentamicin                     S Levofloxacin                   S Linezolid                      S Nitrofurantoin                 S Oxacillin                      S Penicillin                     R Quinupristin/Dalfopristin      S Rifampin                        S Tetracycline                   R Trimethoprim/Sulfa             S Vancomycin                     S   CBC     Status: Abnormal   Collection Time: 02/07/19  4:02 PM  Result Value Ref Range   WBC 6.0 4.0 - 10.5 K/uL   RBC 4.02 3.87 - 5.11 MIL/uL   Hemoglobin 11.2 (L) 12.0 - 15.0 g/dL   HCT 35.3 (L) 36.0 - 46.0 %   MCV 87.8 80.0 - 100.0 fL   MCH 27.9 26.0 - 34.0 pg   MCHC 31.7 30.0 - 36.0 g/dL   RDW 12.5 11.5 - 15.5 %   Platelets 223 150 - 400 K/uL   nRBC 0.0 0.0 - 0.2 %    Comment: Performed at [facility], [address] [place], [place] [address]  Comprehensive metabolic panel     Status: Abnormal   Collection Time: 02/07/19  4:02 PM  Result Value Ref Range   Sodium 140 135 - 145 mmol/L   Potassium 3.5 3.5 - 5.1 mmol/L   Chloride 104 98 - 111 mmol/L   CO2 24 22 - 32 mmol/L   Glucose, Bld 86 70 - 99 mg/dL   BUN 25 (H) 6 - 20 mg/dL   Creatinine, Ser 0.73 0.44 - 1.00 mg/dL   Calcium 9.5 8.9 - 10.3 mg/dL   Total Protein 7.9 6.5 - 8.1 g/dL   Albumin 4.2 3.5 - 5.0 g/dL   AST 26 15 - 41 U/L  ALT 19 0 - 44 U/L   Alkaline Phosphatase 76 38 - 126 U/L   Total Bilirubin 0.2 (L) 0.3 - 1.2 mg/dL   GFR calc non Af Amer >60 >60 mL/min   GFR calc Af Amer >60 >60 mL/min   Anion gap 12 5 - 15    Comment: Performed at [facility], [address] [place], [place] [address]  Lipid panel     Status: None   Collection Time: 02/07/19  4:02 PM  Result Value Ref Range   Cholesterol 161 0 - 200 mg/dL   Triglycerides 102 <150 mg/dL   HDL 42 >40 mg/dL   Total CHOL/HDL Ratio 3.8 RATIO   VLDL 20 0 - 40 mg/dL   LDL Cholesterol 99 0 - 99 mg/dL    Comment:        Total Cholesterol/HDL:CHD Risk Coronary Heart Disease Risk Table                     Men   Women  1/2 Average Risk   3.4   3.3  Average Risk       5.0   4.4  2 X Average Risk   9.6   7.1  3 X Average Risk  23.4   11.0        Use the calculated Patient Ratio above and the CHD Risk Table to determine the  patient's CHD Risk.        ATP III CLASSIFICATION (LDL):  <100     mg/dL   Optimal  100-129  mg/dL   Near or Above                    Optimal  130-159  mg/dL   Borderline  160-189  mg/dL   High  >190     mg/dL   Very High Performed at [facility], [address] [place], [place] [address]   TSH     Status: None   Collection Time: 02/07/19  4:02 PM  Result Value Ref Range   TSH 0.769 0.350 - 4.500 uIU/mL    Comment: Performed by a 3rd Generation assay with a functional sensitivity of <=0.01 uIU/mL. Performed at [facility], [address] [place], [place] [address]   T4, free     Status: None   Collection Time: 02/07/19  4:02 PM  Result Value Ref Range   Free T4 0.82 0.82 - 1.77 ng/dL    Comment: (NOTE) Biotin ingestion may interfere with free T4 tests. If the results are inconsistent with the TSH level, previous test results, or the clinical presentation, then consider biotin interference. If needed, order repeat testing after stopping biotin. Performed at [facility], [address] [place], [place] [address]   VITAMIN D 25 Hydroxy (Vit-D Deficiency, Fractures)     Status: None   Collection Time: 02/07/19  4:02 PM  Result Value Ref Range   Vit D, 25-Hydroxy 43.0 30.0 - 100.0 ng/mL    Comment: (NOTE) Vitamin D deficiency has been defined by the [place] practice guideline as a level of serum 25-OH vitamin D less than 20 ng/mL (1,2). The Endocrine Society went on to further define vitamin D insufficiency as a level between 21 and 29 ng/mL (2). 1. IOM (Institute of Medicine). 2010. Dietary reference   intakes for calcium and D. [place]: The   [employer]. 2. Holick MF, Binkley Blackford, Bischoff-Ferrari HA,  et al.   Evaluation, treatment, and prevention of vitamin D   deficiency: an Endocrine Society clinical practice   guideline. JCEM. 2011 Jul; 96(7):1911-30. Performed At: [facility] [place], [place] [id number] Rush Farmer MD PZ:[id number]    Assessment/Plan: 1. Encounter for general adult medical examination with abnormal findings Annual health maintenance exam today.   2. Type 2 diabetes mellitus with diabetic neuropathy, without long-term current use of insulin ([place]) DM well controlled with HgbA1c 6.3 today. Will increase gabapentin to 600mg  tablets, taking 1.5 in the morning and afternoon, and two tablets at night. Reassess at next visit.  - gabapentin (NEURONTIN) 600 MG tablet; Take 1.5 tablets qam and qpm and 2 tablets po QHS  Dispense: 150 tablet; Refill: 2  3. Essential hypertension Well managed. Continue BP medication as prescribed   4. Routine cervical smear - Pap IG and HPV (high risk) DNA detection  5. Dysuria - UA/M w/rflx Culture, Routine  6. Uncontrolled type 2 diabetes mellitus with hyperglycemia (H[facility]) - POCT HgB A1C 6.3 today.   General Counseling: viha kriegel understanding of the findings of todays visit and agrees with plan of treatment. I have discussed any further diagnostic evaluation that may be needed or ordered today. We also reviewed her medications today. she has been encouraged to call the office with any questions or concerns that should arise related to todays visit.    Counseling:  Diabetes Counseling:  1. Addition of ACE inh/ ARB'S for nephroprotection. Microalbumin is updated  2. Diabetic foot care, prevention of complications. Podiatry consult 3. Exercise and lose weight.  4. Diabetic eye examination, Diabetic eye exam is updated  5. Monitor blood sugar closlely. nutrition counseling.  6. Sign and symptoms of hypoglycemia including shaking sweating,confusion and headaches.  This patient was seen by Leretha Pol FNP Collaboration with Dr Lavera Guise as a part of collaborative care agreement  Orders Placed This Encounter  Procedures  . Microscopic Examination  . Urine Culture, Reflex   . UA/M w/rflx Culture, Routine  . POCT HgB A1C    Meds ordered this encounter  Medications  . gabapentin (NEURONTIN) 600 MG tablet    Sig: Take 1.5 tablets qam and qpm and 2 tablets po QHS    Dispense:  150 tablet    Refill:  2    Order Specific Question:   Supervising Provider    Answer:   Lavera Guise [[id number]]    Time spent: [place], MD  Internal Medicine

## 2019-02-03 LAB — UA/M W/RFLX CULTURE, ROUTINE
Bilirubin, UA: NEGATIVE
Nitrite, UA: POSITIVE — AB
Protein,UA: NEGATIVE
Specific Gravity, UA: 1.026 (ref 1.005–1.030)
Urobilinogen, Ur: 0.2 mg/dL (ref 0.2–1.0)
pH, UA: 5 (ref 5.0–7.5)

## 2019-02-03 LAB — MICROSCOPIC EXAMINATION: Casts: NONE SEEN /LPF

## 2019-02-03 LAB — URINE CULTURE, REFLEX

## 2019-02-07 ENCOUNTER — Other Ambulatory Visit
Admission: RE | Admit: 2019-02-07 | Discharge: 2019-02-07 | Disposition: A | Discharge status: Home / Self Care | Payer: BLUE CROSS/BLUE SHIELD | Source: Ambulatory Visit | Attending: Nurse Practitioner | Admitting: Nurse Practitioner

## 2019-02-07 DIAGNOSIS — E119 Type 2 diabetes mellitus without complications: Secondary | ICD-10-CM | POA: Diagnosis not present

## 2019-02-07 DIAGNOSIS — E559 Vitamin D deficiency, unspecified: Secondary | ICD-10-CM | POA: Diagnosis not present

## 2019-02-07 DIAGNOSIS — I10 Essential (primary) hypertension: Secondary | ICD-10-CM | POA: Insufficient documentation

## 2019-02-07 LAB — CBC
HCT: 35.3 % — ABNORMAL LOW (ref 36.0–46.0)
Hemoglobin: 11.2 g/dL — ABNORMAL LOW (ref 12.0–15.0)
MCH: 27.9 pg (ref 26.0–34.0)
MCHC: 31.7 g/dL (ref 30.0–36.0)
MCV: 87.8 fL (ref 80.0–100.0)
Platelets: 223 K/uL (ref 150–400)
RBC: 4.02 MIL/uL (ref 3.87–5.11)
RDW: 12.5 % (ref 11.5–15.5)
WBC: 6 K/uL (ref 4.0–10.5)
nRBC: 0 % (ref 0.0–0.2)

## 2019-02-07 LAB — COMPREHENSIVE METABOLIC PANEL WITH GFR
ALT: 19 U/L (ref 0–44)
AST: 26 U/L (ref 15–41)
Albumin: 4.2 g/dL (ref 3.5–5.0)
Alkaline Phosphatase: 76 U/L (ref 38–126)
Anion gap: 12 (ref 5–15)
BUN: 25 mg/dL — ABNORMAL HIGH (ref 6–20)
CO2: 24 mmol/L (ref 22–32)
Calcium: 9.5 mg/dL (ref 8.9–10.3)
Chloride: 104 mmol/L (ref 98–111)
Creatinine, Ser: 0.73 mg/dL (ref 0.44–1.00)
GFR calc Af Amer: >60 mL/min (ref >60)
GFR calc non Af Amer: >60 mL/min (ref >60)
Glucose, Bld: 86 mg/dL (ref 70–99)
Potassium: 3.5 mmol/L (ref 3.5–5.1)
Sodium: 140 mmol/L (ref 135–145)
Total Bilirubin: 0.2 mg/dL — ABNORMAL LOW (ref 0.3–1.2)
Total Protein: 7.9 g/dL (ref 6.5–8.1)

## 2019-02-07 LAB — T4, FREE: Free T4: 0.82 ng/dL (ref 0.82–1.77)

## 2019-02-07 LAB — LIPID PANEL
Cholesterol: 161 mg/dL (ref 0–200)
HDL: 42 mg/dL (ref >40)
LDL Cholesterol: 99 mg/dL (ref 0–99)
Total CHOL/HDL Ratio: 3.8 ratio
Triglycerides: 102 mg/dL (ref <150)
VLDL: 20 mg/dL (ref 0–40)

## 2019-02-07 LAB — PAP IG AND HPV HIGH-RISK: HPV, high-risk: NEGATIVE

## 2019-02-07 LAB — TSH: TSH: 0.769 u[IU]/mL (ref 0.350–4.500)

## 2019-02-08 LAB — VITAMIN D 25 HYDROXY (VIT D DEFICIENCY, FRACTURES): Vit D, 25-Hydroxy: 43 ng/mL (ref 30.0–100.0)

## 2019-02-10 ENCOUNTER — Other Ambulatory Visit: Payer: Self-pay

## 2019-02-10 ENCOUNTER — Telehealth: Payer: Self-pay

## 2019-02-10 MED ORDER — CIPROFLOXACIN HCL 500 MG PO TABS: 500.0000 mg | ORAL_TABLET | Freq: Two times a day (BID) | ORAL | 0 refills | Status: AC

## 2019-02-10 NOTE — Telephone Encounter (Signed)
-----   Message from Lavera Guise, MD sent at 02/10/2019 11:26 AM EDT ----- Her urine cx are postive for UTI, please call in cipro 500 mg po bid x 7 days#14, will need her urine rechecked in 2 weeks, if continues to have blood in her urine, will need bladder u/s

## 2019-02-10 NOTE — Telephone Encounter (Signed)
Spoke with pt urine did showed uti we send cipro for 7 days need follow up in 2 weeks to repeat urine

## 2019-02-13 ENCOUNTER — Other Ambulatory Visit: Payer: Self-pay

## 2019-02-13 MED ORDER — GLIMEPIRIDE 2 MG PO TABS: 2.0000 mg | ORAL_TABLET | Freq: Two times a day (BID) | ORAL | 6 refills | Status: DC

## 2019-02-13 MED ORDER — LIRAGLUTIDE 18 MG/3ML ~~LOC~~ SOPN: PEN_INJECTOR | SUBCUTANEOUS | 3 refills | Status: DC

## 2019-02-13 MED ORDER — METOPROLOL TARTRATE 100 MG PO TABS: 100.0000 mg | ORAL_TABLET | Freq: Two times a day (BID) | ORAL | 6 refills | Status: DC

## 2019-02-20 ENCOUNTER — Other Ambulatory Visit: Payer: Self-pay

## 2019-02-20 MED ORDER — MELOXICAM 7.5 MG PO TABS: 7.5000 mg | ORAL_TABLET | Freq: Two times a day (BID) | ORAL | 5 refills | Status: DC

## 2019-02-20 MED ORDER — METFORMIN HCL 500 MG PO TABS: 1000.0000 mg | ORAL_TABLET | Freq: Two times a day (BID) | ORAL | 0 refills | Status: DC

## 2019-02-21 ENCOUNTER — Other Ambulatory Visit: Payer: Self-pay

## 2019-02-21 DIAGNOSIS — R300 Dysuria: Secondary | ICD-10-CM

## 2019-02-28 DIAGNOSIS — R300 Dysuria: Secondary | ICD-10-CM | POA: Diagnosis not present

## 2019-03-01 LAB — MICROSCOPIC EXAMINATION
Casts: NONE SEEN /LPF
RBC, Urine: NONE SEEN /HPF (ref 0–2)

## 2019-03-01 LAB — URINALYSIS, ROUTINE W REFLEX MICROSCOPIC
Bilirubin, UA: NEGATIVE
Ketones, UA: NEGATIVE
Nitrite, UA: NEGATIVE
Protein,UA: NEGATIVE
RBC, UA: NEGATIVE
Specific Gravity, UA: >=1.03 — AB (ref 1.005–1.030)
Urobilinogen, Ur: 0.2 mg/dL (ref 0.2–1.0)
pH, UA: 5 (ref 5.0–7.5)

## 2019-03-12 ENCOUNTER — Other Ambulatory Visit: Payer: Self-pay

## 2019-03-12 MED ORDER — LEVEMIR FLEXTOUCH 100 UNIT/ML ~~LOC~~ SOPN: 32.0000 [IU] | PEN_INJECTOR | Freq: Every day | SUBCUTANEOUS | 1 refills | Status: DC

## 2019-03-12 MED ORDER — CITALOPRAM HYDROBROMIDE 20 MG PO TABS: ORAL_TABLET | ORAL | 1 refills | Status: DC

## 2019-03-31 ENCOUNTER — Other Ambulatory Visit: Payer: Self-pay | Admitting: Nurse Practitioner

## 2019-03-31 ENCOUNTER — Other Ambulatory Visit: Payer: Self-pay

## 2019-03-31 MED ORDER — AMITRIPTYLINE HCL 10 MG PO TABS: 10.0000 mg | ORAL_TABLET | Freq: Every day | ORAL | 3 refills | Status: DC

## 2019-03-31 MED ORDER — LOSARTAN POTASSIUM 100 MG PO TABS: 100.0000 mg | ORAL_TABLET | ORAL | 3 refills | Status: DC

## 2019-05-12 ENCOUNTER — Other Ambulatory Visit: Payer: Self-pay

## 2019-05-12 ENCOUNTER — Other Ambulatory Visit: Payer: Self-pay | Admitting: Nurse Practitioner

## 2019-05-12 ENCOUNTER — Encounter: Payer: Self-pay | Admitting: Nurse Practitioner

## 2019-05-12 ENCOUNTER — Ambulatory Visit: Payer: BC Managed Care – PPO | Admitting: Nurse Practitioner

## 2019-05-12 VITALS — Temp 97.9°F | Ht 65.0 in

## 2019-05-12 DIAGNOSIS — R519 Headache, unspecified: Secondary | ICD-10-CM | POA: Insufficient documentation

## 2019-05-12 DIAGNOSIS — R51 Headache: Secondary | ICD-10-CM | POA: Diagnosis not present

## 2019-05-12 DIAGNOSIS — E1140 Type 2 diabetes mellitus with diabetic neuropathy, unspecified: Secondary | ICD-10-CM

## 2019-05-12 MED ORDER — GABAPENTIN 600 MG PO TABS: ORAL_TABLET | ORAL | 2 refills | Status: DC

## 2019-05-12 NOTE — Progress Notes (Signed)
[facility] [place],  [address]  Internal MEDICINE  Telephone Visit  Patient Name: Belinda Soto  [id number]  [id number]  Date of Service: 05/12/2019  I connected with the patient at 4:51pm by webcam and verified the patients identity using two identifiers.   I discussed the limitations, risks, security and privacy concerns of performing an evaluation and management service by webcam and the availability of in person appointments. I also discussed with the patient that there may be a patient responsible charge related to the service.  The patient expressed understanding and agrees to proceed.    Chief Complaint  Patient presents with  . Telephone Assessment  . Telephone Screen  . Headache    when she first woke up and took meds and it went away. pounding headache    The patient has been contacted via webcam for follow up visit due to concerns for spread of novel coronavirus. The patient presents for sick visit. She woke up with headache this morning. She states that she told them she would take some medication and come in a little later. Due to COVID 19, they request a note from the doctor stating it is safe for her to return to work. She has not been exposed to COVID 19 to her knowledge. She has no fever, congestion, cough, sore throat, nausea, or vomiting.       Current Medication: Outpatient Encounter Medications as of 05/12/2019  Medication Sig  . amitriptyline (ELAVIL) 10 MG tablet Take 1 tablet (10 mg total) by mouth at bedtime.  Marland Kitchen amLODipine (NORVASC) 2.5 MG tablet Take 1 tablet (2.5 mg total) by mouth daily.  Marland Kitchen aspirin 81 MG tablet Take 81 mg by mouth daily.  . bacitracin ophthalmic ointment Place 1 application into the right eye 4 (four) times daily. apply to right eye  . calcium carbonate (TUMS - DOSED IN MG ELEMENTAL CALCIUM) 500 MG chewable tablet Chew 1 tablet by mouth as needed for indigestion or heartburn.  . Cholecalciferol (VITAMIN D  PO) Take 1,000 Units by mouth daily.   . citalopram (CELEXA) 20 MG tablet TAKE 1 TABLET BY MOUTH ONCE DAILY AROUND  7-8PM  FOR  DEPRESSION  . fluticasone (FLONASE) 50 MCG/ACT nasal spray Place 2 sprays into both nostrils daily.  Marland Kitchen gabapentin (NEURONTIN) 600 MG tablet Take 1.5 tablets qam and qpm and 2 tablets po QHS  . gentamicin (GARAMYCIN) 0.3 % ophthalmic solution Place 2 drops into both eyes every 4 (four) hours.  Marland Kitchen glimepiride (AMARYL) 2 MG tablet Take 1 tablet (2 mg total) by mouth 2 (two) times daily.  . hydrochlorothiazide (HYDRODIURIL) 25 MG tablet Take 1 tablet (25 mg total) by mouth every morning.  . hydrocortisone 2.5 % ointment Apply topically.  . Insulin Detemir (LEVEMIR FLEXTOUCH) 100 UNIT/ML Pen Inject 32 Units into the skin at bedtime.  . liraglutide (VICTOZA) 18 MG/3ML SOPN Inject 1.8 mg subcutaneously once daily  . losartan (COZAAR) 100 MG tablet Take 1 tablet (100 mg total) by mouth every morning.  . meloxicam (MOBIC) 7.5 MG tablet Take 1 tablet (7.5 mg total) by mouth 2 (two) times daily.  . metFORMIN (GLUCOPHAGE) 500 MG tablet Take 2 tablets (1,000 mg total) by mouth 2 (two) times daily.  . metoprolol tartrate (LOPRESSOR) 100 MG tablet Take 1 tablet (100 mg total) by mouth 2 (two) times daily.  Marland Kitchen NOVOFINE 32G X 6 MM MISC USE WITH VICTOZA ONCE DAILY  . nystatin (MYCOSTATIN) 100000 UNIT/ML suspension Take 5  mLs (500,000 Units total) by mouth 4 (four) times daily.  Marland Kitchen omeprazole (PRILOSEC) 20 MG capsule Take 1 capsule (20 mg total) by mouth daily.   No facility-administered encounter medications on file as of 05/12/2019.     Surgical History: Past Surgical History:  Procedure Laterality Date  . c sections    . COLONOSCOPY N/A 03/08/2015   Procedure: COLONOSCOPY;  Surgeon: Hulen Luster, MD;  Location: [facility] ENDOSCOPY;  Service: Gastroenterology;  Laterality: N/A;  . COLONOSCOPY  02/2015  . FISSURECTOMY N/A 08/24/2017   Procedure: FISSURECTOMY;  Surgeon: Christene Lye,  MD;  Location: [facility] ORS;  Service: General;  Laterality: N/A;  . FOOT SURGERY Left 2005  . SPHINCTEROTOMY N/A 08/24/2017   Procedure: SPHINCTEROTOMY;  Surgeon: Christene Lye, MD;  Location: [facility] ORS;  Service: General;  Laterality: N/A;    Medical History: Past Medical History:  Diagnosis Date  . Arthritis   . Depression   . Diabetes mellitus without complication ([place])   . Dysrhythmia    palpitations  . GERD (gastroesophageal reflux disease)    TUMS PRN  . Headache    H/O MIGRAINES  . History of kidney stones   . Hypertension   . Palpitations     Family History: Family History  Problem Relation Age of Onset  . Diabetes Mother   . Hypertension Mother   . Glaucoma Maternal Grandmother   . Alzheimer's disease Maternal Grandmother   . Breast cancer Neg Hx     Social History   Socioeconomic History  . Marital status: Married    Spouse name: Not on file  . Number of children: Not on file  . Years of education: Not on file  . Highest education level: Not on file  Occupational History  . Not on file  Social Needs  . Financial resource strain: Not on file  . Food insecurity    Worry: Not on file    Inability: Not on file  . Transportation needs    Medical: Not on file    Non-medical: Not on file  Tobacco Use  . Smoking status: Former Smoker    Packs/day: 0.50    Years: 12.00    Pack years: 6.00    Types: Cigarettes    Quit date: 08/20/1985    Years since quitting: 33.7  . Smokeless tobacco: Never Used  Substance and Sexual Activity  . Alcohol use: Yes    Comment: rarely  . Drug use: No  . Sexual activity: Not on file  Lifestyle  . Physical activity    Days per week: Not on file    Minutes per session: Not on file  . Stress: Not on file  Relationships  . Social Herbalist on phone: Not on file    Gets together: Not on file    Attends religious service: Not on file    Active member of club or organization: Not on file    Attends  meetings of clubs or organizations: Not on file    Relationship status: Not on file  . Intimate partner violence    Fear of current or ex partner: Not on file    Emotionally abused: Not on file    Physically abused: Not on file    Forced sexual activity: Not on file  Other Topics Concern  . Not on file  Social History Narrative  . Not on file      Review of Systems  Constitutional: Negative for activity change,  chills, fatigue and unexpected weight change.  HENT: Negative for congestion, postnasal drip, rhinorrhea, sneezing and sore throat.   Respiratory: Negative for cough, chest tightness and shortness of breath.   Cardiovascular: Negative for chest pain and palpitations.  Gastrointestinal: Negative for abdominal pain, constipation, diarrhea, nausea and vomiting.  Musculoskeletal: Negative for arthralgias, back pain, joint swelling and neck pain.  Skin: Negative for rash.  Allergic/Immunologic: Negative for environmental allergies.  Neurological: Positive for headaches. Negative for tremors and numbness.       Patient states that she had headache this morning which resolved after taking OTC medication.   Hematological: Negative for adenopathy. Does not bruise/bleed easily.  Psychiatric/Behavioral: Negative for behavioral problems (Depression), sleep disturbance and suicidal ideas. The patient is not nervous/anxious.     Today's Vitals   05/12/19 1534  Temp: 97.9 F (36.6 C)  Height: 5\' 5"  (1.651 m)   Body mass index is 28.62 kg/m.  Observation/Objective:   The patient is alert and oriented. She is pleasant and answers all questions appropriately. Breathing is non-labored. She is in no acute distress at this time.    Assessment/Plan:  1. Acute nonintractable headache, unspecified headache type Patient with acute headache this morning which has resolved. She has no symptoms which are suspicious of COVID 19. Will send note stting she can return to work 05/13/2019 without  restrictions.    General Counseling: novia lansberry understanding of the findings of today's phone visit and agrees with plan of treatment. I have discussed any further diagnostic evaluation that may be needed or ordered today. We also reviewed her medications today. she has been encouraged to call the office with any questions or concerns that should arise related to todays visit.   This patient was seen by Leretha Pol FNP Collaboration with Dr Lavera Guise as a part of collaborative care agreement  Time spent: 26 Minutes    Dr Lavera Guise Internal medicine

## 2019-05-22 ENCOUNTER — Other Ambulatory Visit: Payer: Self-pay

## 2019-05-22 MED ORDER — METFORMIN HCL 500 MG PO TABS: 1000.0000 mg | ORAL_TABLET | Freq: Two times a day (BID) | ORAL | 0 refills | Status: DC

## 2019-06-06 ENCOUNTER — Other Ambulatory Visit: Payer: Self-pay

## 2019-06-06 ENCOUNTER — Ambulatory Visit: Payer: BC Managed Care – PPO | Admitting: Nurse Practitioner

## 2019-06-06 ENCOUNTER — Encounter: Payer: Self-pay | Admitting: Nurse Practitioner

## 2019-06-06 VITALS — BP 124/69 | HR 76 | Resp 16 | Ht 65.0 in | Wt 169.2 lb

## 2019-06-06 DIAGNOSIS — E1165 Type 2 diabetes mellitus with hyperglycemia: Secondary | ICD-10-CM | POA: Diagnosis not present

## 2019-06-06 DIAGNOSIS — I8311 Varicose veins of right lower extremity with inflammation: Secondary | ICD-10-CM | POA: Diagnosis not present

## 2019-06-06 DIAGNOSIS — I10 Essential (primary) hypertension: Secondary | ICD-10-CM | POA: Diagnosis not present

## 2019-06-06 DIAGNOSIS — I8312 Varicose veins of left lower extremity with inflammation: Secondary | ICD-10-CM

## 2019-06-06 LAB — POCT GLYCOSYLATED HEMOGLOBIN (HGB A1C): Hemoglobin A1C: 6.3 % — AB (ref 4.0–5.6)

## 2019-06-06 NOTE — Progress Notes (Signed)
[facility] [place], [place] [address]  Internal MEDICINE  Office Visit Note  Patient Name: Belinda Soto  [id number]  [id number]  Date of Service: 06/06/2019  Chief Complaint  Patient presents with  . Diabetes  . Hypertension  . Gastroesophageal Reflux    The patient is here for routine follow up of diabetes. Her HgbA1c is well controlled at 6.3 today. She is having trouble with varicose veins. Getting more noticeable and are painful. Her blood pressure is well managed.She has no other concerns or complaints today.       Current Medication: Outpatient Encounter Medications as of 06/06/2019  Medication Sig  . amitriptyline (ELAVIL) 10 MG tablet Take 1 tablet (10 mg total) by mouth at bedtime.  Marland Kitchen amLODipine (NORVASC) 2.5 MG tablet Take 1 tablet (2.5 mg total) by mouth daily.  Marland Kitchen aspirin 81 MG tablet Take 81 mg by mouth daily.  . bacitracin ophthalmic ointment Place 1 application into the right eye 4 (four) times daily. apply to right eye  . calcium carbonate (TUMS - DOSED IN MG ELEMENTAL CALCIUM) 500 MG chewable tablet Chew 1 tablet by mouth as needed for indigestion or heartburn.  . Cholecalciferol (VITAMIN D PO) Take 1,000 Units by mouth daily.   . citalopram (CELEXA) 20 MG tablet TAKE 1 TABLET BY MOUTH ONCE DAILY AROUND  7-8PM  FOR  DEPRESSION  . fluticasone (FLONASE) 50 MCG/ACT nasal spray Place 2 sprays into both nostrils daily.  Marland Kitchen gabapentin (NEURONTIN) 600 MG tablet Take 1.5 tablets qam and qpm and 2 tablets po QHS  . gentamicin (GARAMYCIN) 0.3 % ophthalmic solution Place 2 drops into both eyes every 4 (four) hours.  Marland Kitchen glimepiride (AMARYL) 2 MG tablet Take 1 tablet (2 mg total) by mouth 2 (two) times daily.  . hydrochlorothiazide (HYDRODIURIL) 25 MG tablet Take 1 tablet (25 mg total) by mouth every morning.  . hydrocortisone 2.5 % ointment Apply topically.  . Insulin Detemir (LEVEMIR FLEXTOUCH) 100 UNIT/ML Pen Inject 32 Units into the skin at  bedtime.  . liraglutide (VICTOZA) 18 MG/3ML SOPN Inject 1.8 mg subcutaneously once daily  . losartan (COZAAR) 100 MG tablet Take 1 tablet (100 mg total) by mouth every morning.  . meloxicam (MOBIC) 7.5 MG tablet Take 1 tablet (7.5 mg total) by mouth 2 (two) times daily.  . metFORMIN (GLUCOPHAGE) 500 MG tablet Take 2 tablets (1,000 mg total) by mouth 2 (two) times daily.  . metoprolol tartrate (LOPRESSOR) 100 MG tablet Take 1 tablet (100 mg total) by mouth 2 (two) times daily.  Marland Kitchen NOVOFINE 32G X 6 MM MISC USE WITH VICTOZA ONCE DAILY  . nystatin (MYCOSTATIN) 100000 UNIT/ML suspension Take 5 mLs (500,000 Units total) by mouth 4 (four) times daily.  Marland Kitchen omeprazole (PRILOSEC) 20 MG capsule Take 1 capsule (20 mg total) by mouth daily.   No facility-administered encounter medications on file as of 06/06/2019.     Surgical History: Past Surgical History:  Procedure Laterality Date  . c sections    . COLONOSCOPY N/A 03/08/2015   Procedure: COLONOSCOPY;  Surgeon: Hulen Luster, MD;  Location: [facility] ENDOSCOPY;  Service: Gastroenterology;  Laterality: N/A;  . COLONOSCOPY  02/2015  . FISSURECTOMY N/A 08/24/2017   Procedure: FISSURECTOMY;  Surgeon: Christene Lye, MD;  Location: [facility] ORS;  Service: General;  Laterality: N/A;  . FOOT SURGERY Left 2005  . SPHINCTEROTOMY N/A 08/24/2017   Procedure: SPHINCTEROTOMY;  Surgeon: Christene Lye, MD;  Location: [facility] ORS;  Service: General;  Laterality: N/A;    Medical History: Past Medical History:  Diagnosis Date  . Arthritis   . Depression   . Diabetes mellitus without complication ([place])   . Dysrhythmia    palpitations  . GERD (gastroesophageal reflux disease)    TUMS PRN  . Headache    H/O MIGRAINES  . History of kidney stones   . Hypertension   . Palpitations     Family History: Family History  Problem Relation Age of Onset  . Diabetes Mother   . Hypertension Mother   . Glaucoma Maternal Grandmother   . Alzheimer's disease  Maternal Grandmother   . Breast cancer Neg Hx     Social History   Socioeconomic History  . Marital status: Married    Spouse name: Not on file  . Number of children: Not on file  . Years of education: Not on file  . Highest education level: Not on file  Occupational History  . Not on file  Social Needs  . Financial resource strain: Not on file  . Food insecurity    Worry: Not on file    Inability: Not on file  . Transportation needs    Medical: Not on file    Non-medical: Not on file  Tobacco Use  . Smoking status: Former Smoker    Packs/day: 0.50    Years: 12.00    Pack years: 6.00    Types: Cigarettes    Quit date: 08/20/1985    Years since quitting: 33.8  . Smokeless tobacco: Never Used  Substance and Sexual Activity  . Alcohol use: Yes    Comment: rarely  . Drug use: No  . Sexual activity: Not on file  Lifestyle  . Physical activity    Days per week: Not on file    Minutes per session: Not on file  . Stress: Not on file  Relationships  . Social Herbalist on phone: Not on file    Gets together: Not on file    Attends religious service: Not on file    Active member of club or organization: Not on file    Attends meetings of clubs or organizations: Not on file    Relationship status: Not on file  . Intimate partner violence    Fear of current or ex partner: Not on file    Emotionally abused: Not on file    Physically abused: Not on file    Forced sexual activity: Not on file  Other Topics Concern  . Not on file  Social History Narrative  . Not on file      Review of Systems  Constitutional: Negative for activity change, chills, fatigue and unexpected weight change.  HENT: Negative for congestion, postnasal drip, rhinorrhea, sneezing and sore throat.   Respiratory: Negative for cough, chest tightness, shortness of breath and wheezing.   Cardiovascular: Negative for chest pain and palpitations.       Varicose veins   Gastrointestinal:  Negative for abdominal pain, constipation, diarrhea, nausea and vomiting.  Endocrine: Negative for cold intolerance, heat intolerance, polydipsia and polyuria.       Blood sugars doing well   Genitourinary: Negative for dysuria and frequency.  Musculoskeletal: Negative for arthralgias, back pain, joint swelling and neck pain.  Skin: Negative for rash.  Allergic/Immunologic: Negative for environmental allergies, food allergies and immunocompromised state.  Neurological: Positive for numbness. Negative for dizziness and tremors.       Burning sensation in both feet.  Hematological: Negative for adenopathy. Does not bruise/bleed easily.  Psychiatric/Behavioral: Negative for behavioral problems (Depression), sleep disturbance and suicidal ideas. The patient is not nervous/anxious.     Today's Vitals   06/06/19 1521  BP: 124/69  Pulse: 76  Resp: 16  SpO2: 96%  Weight: 169 lb 3.2 oz (76.7 kg)  Height: 5\' 5"  (1.651 m)   Body mass index is 28.16 kg/m.  Physical Exam Vitals signs and nursing note reviewed.  Constitutional:      General: She is not in acute distress.    Appearance: Normal appearance. She is well-developed. She is not diaphoretic.  HENT:     Head: Normocephalic and atraumatic.     Mouth/Throat:     Pharynx: No oropharyngeal exudate.  Eyes:     Pupils: Pupils are equal, round, and reactive to light.  Neck:     Musculoskeletal: Normal range of motion and neck supple.     Thyroid: No thyromegaly.     Vascular: No JVD.     Trachea: No tracheal deviation.  Cardiovascular:     Rate and Rhythm: Normal rate and regular rhythm.     Heart sounds: Normal heart sounds. No murmur. No friction rub. No gallop.      Comments: Moderate varicose veins of bilateral lower extremities. Inflammation present with some tenderness with palpation.  Pulmonary:     Effort: Pulmonary effort is normal. No respiratory distress.     Breath sounds: Normal breath sounds. No wheezing or rales.   Chest:     Chest wall: No tenderness.  Abdominal:     General: Bowel sounds are normal.     Palpations: Abdomen is soft.  Musculoskeletal: Normal range of motion.  Lymphadenopathy:     Cervical: No cervical adenopathy.  Skin:    General: Skin is warm and dry.  Neurological:     Mental Status: She is alert and oriented to person, place, and time.     Cranial Nerves: No cranial nerve deficit.  Psychiatric:        Behavior: Behavior normal.        Thought Content: Thought content normal.        Judgment: Judgment normal.   Assessment/Plan: 1. Type 2 diabetes mellitus with hyperglycemia, unspecified whether long term insulin use (HCC) - POCT HgB A1C 6.3 today. Continue diabetic medication and gabapentin as prescribed   2. Varicose veins of both lower extremities with inflammation Recommend use of compression socks every day. Refer to vein and vascular for further evaluation and treatment.  - Ambulatory referral to Vascular Surgery  3. Essential hypertension Stable. Continue bp medication as prescribed.   General Counseling: Belinda Soto understanding of the findings of todays visit and agrees with plan of treatment. I have discussed any further diagnostic evaluation that may be needed or ordered today. We also reviewed her medications today. she has been encouraged to call the office with any questions or concerns that should arise related to todays visit.  Diabetes Counseling:  1. Addition of ACE inh/ ARB'S for nephroprotection. Microalbumin is updated  2. Diabetic foot care, prevention of complications. Podiatry consult 3. Exercise and lose weight.  4. Diabetic eye examination, Diabetic eye exam is updated  5. Monitor blood sugar closlely. nutrition counseling.  6. Sign and symptoms of hypoglycemia including shaking sweating,confusion and headaches.  This patient was seen by Leretha Pol FNP Collaboration with Dr Lavera Guise as a part of collaborative care  agreement  Orders Placed This Encounter  Procedures  .  Ambulatory referral to Vascular Surgery  . POCT HgB A1C     Time spent: 25 Minutes      Dr Lavera Guise Internal medicine

## 2019-06-29 IMAGING — CR DG SHOULDER 2+V*L*
1 series · 3 of 3 positions shown · non-contrast
Comparison: None.

CLINICAL DATA: Pain

EXAM:
LEFT SHOULDER - 2+ VIEW

[Series 1: dg shoulder left · 0.14mm/px · 3 of 3 slices shown]
[im 1/3]
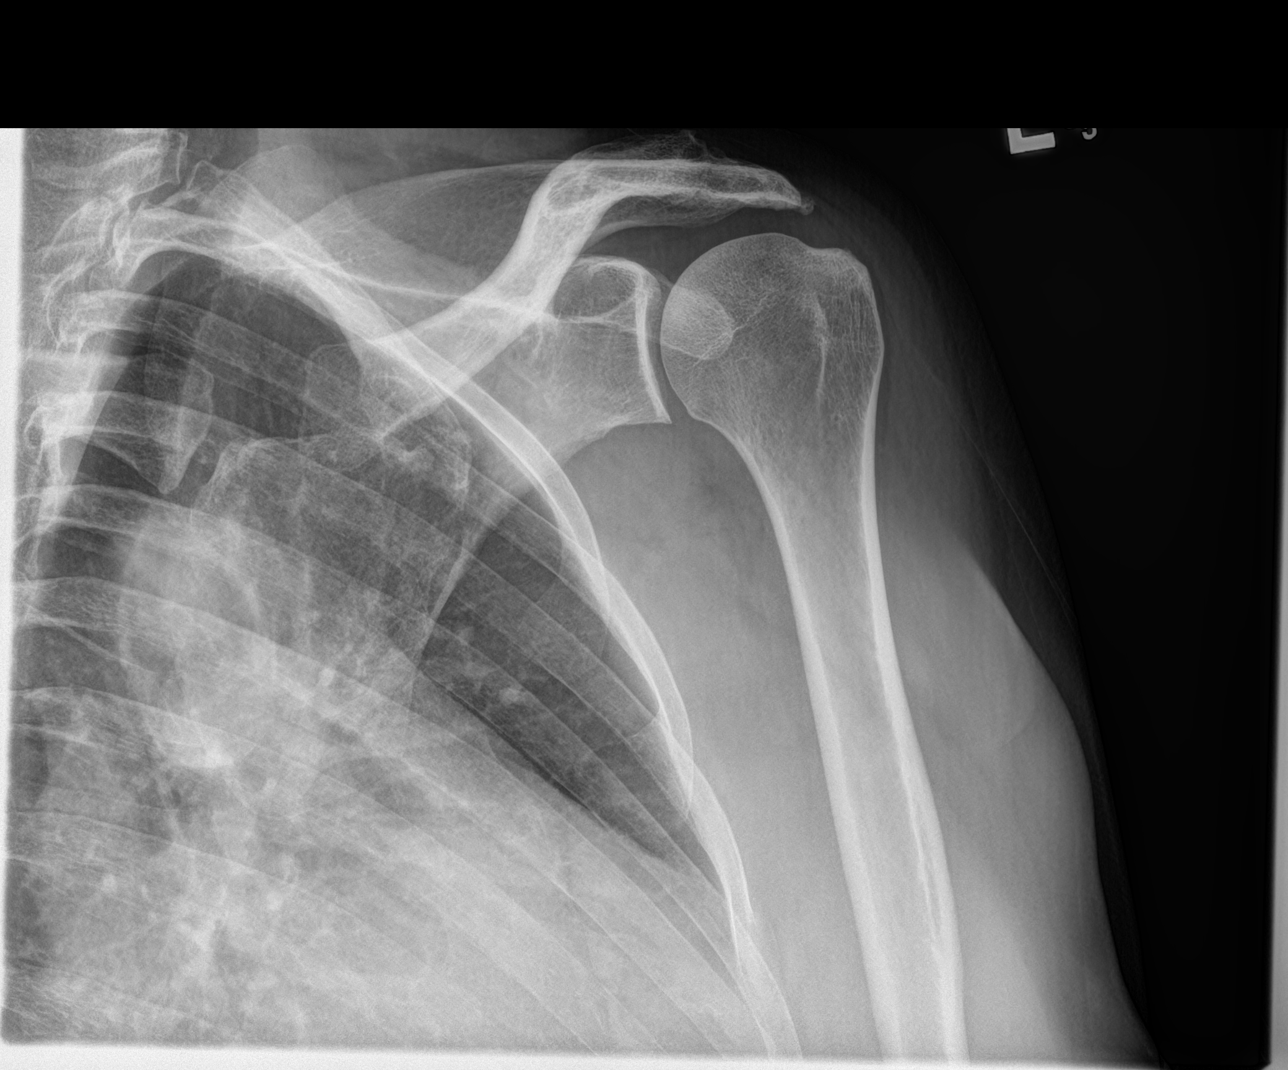
[im 2/3]
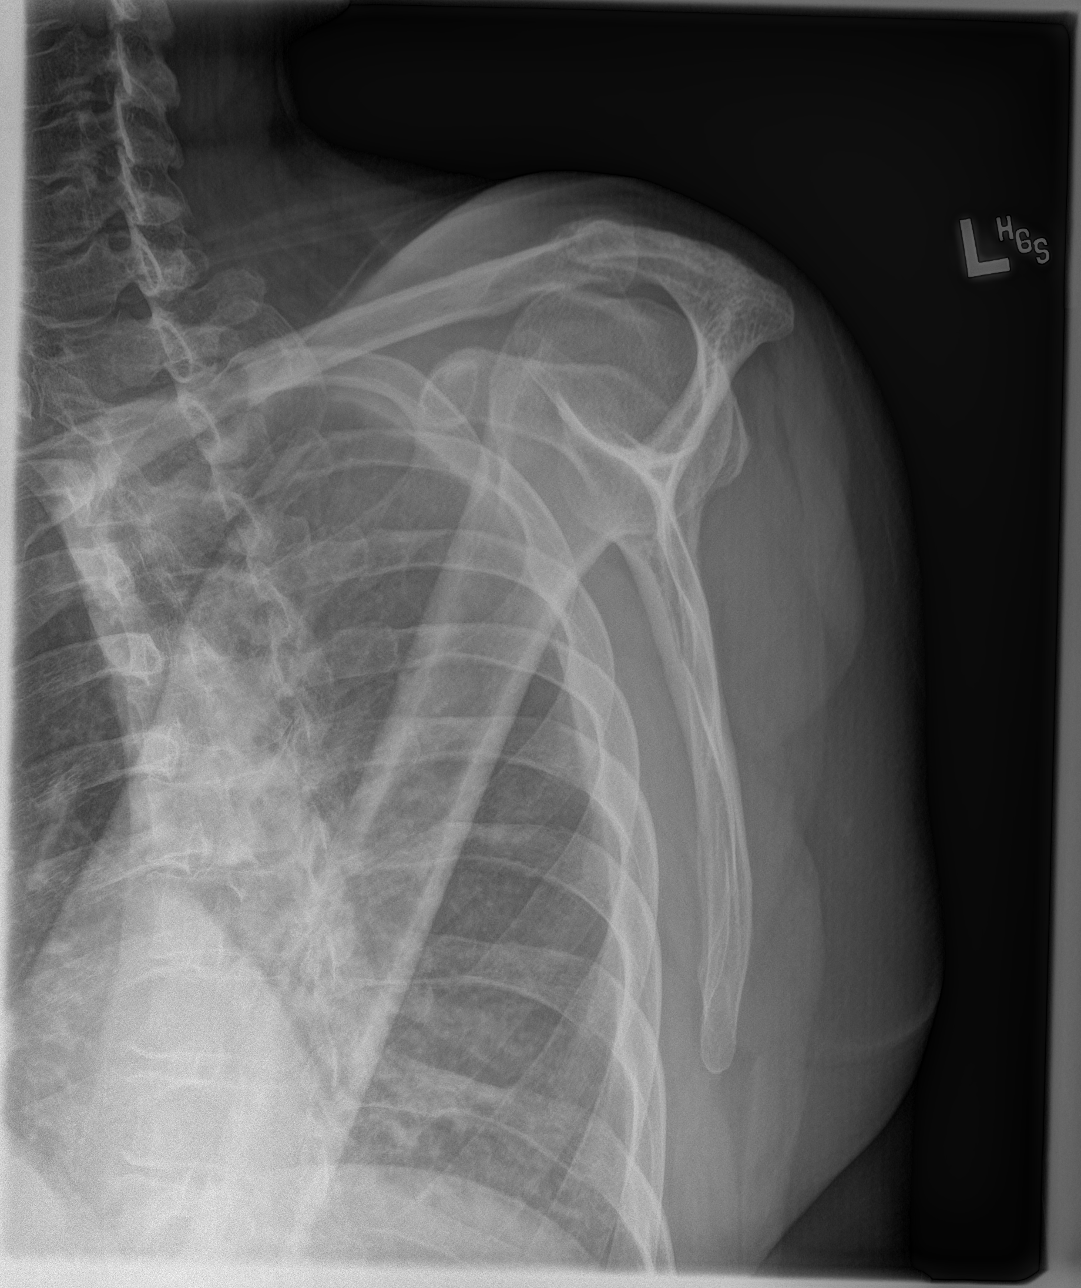
[im 3/3]
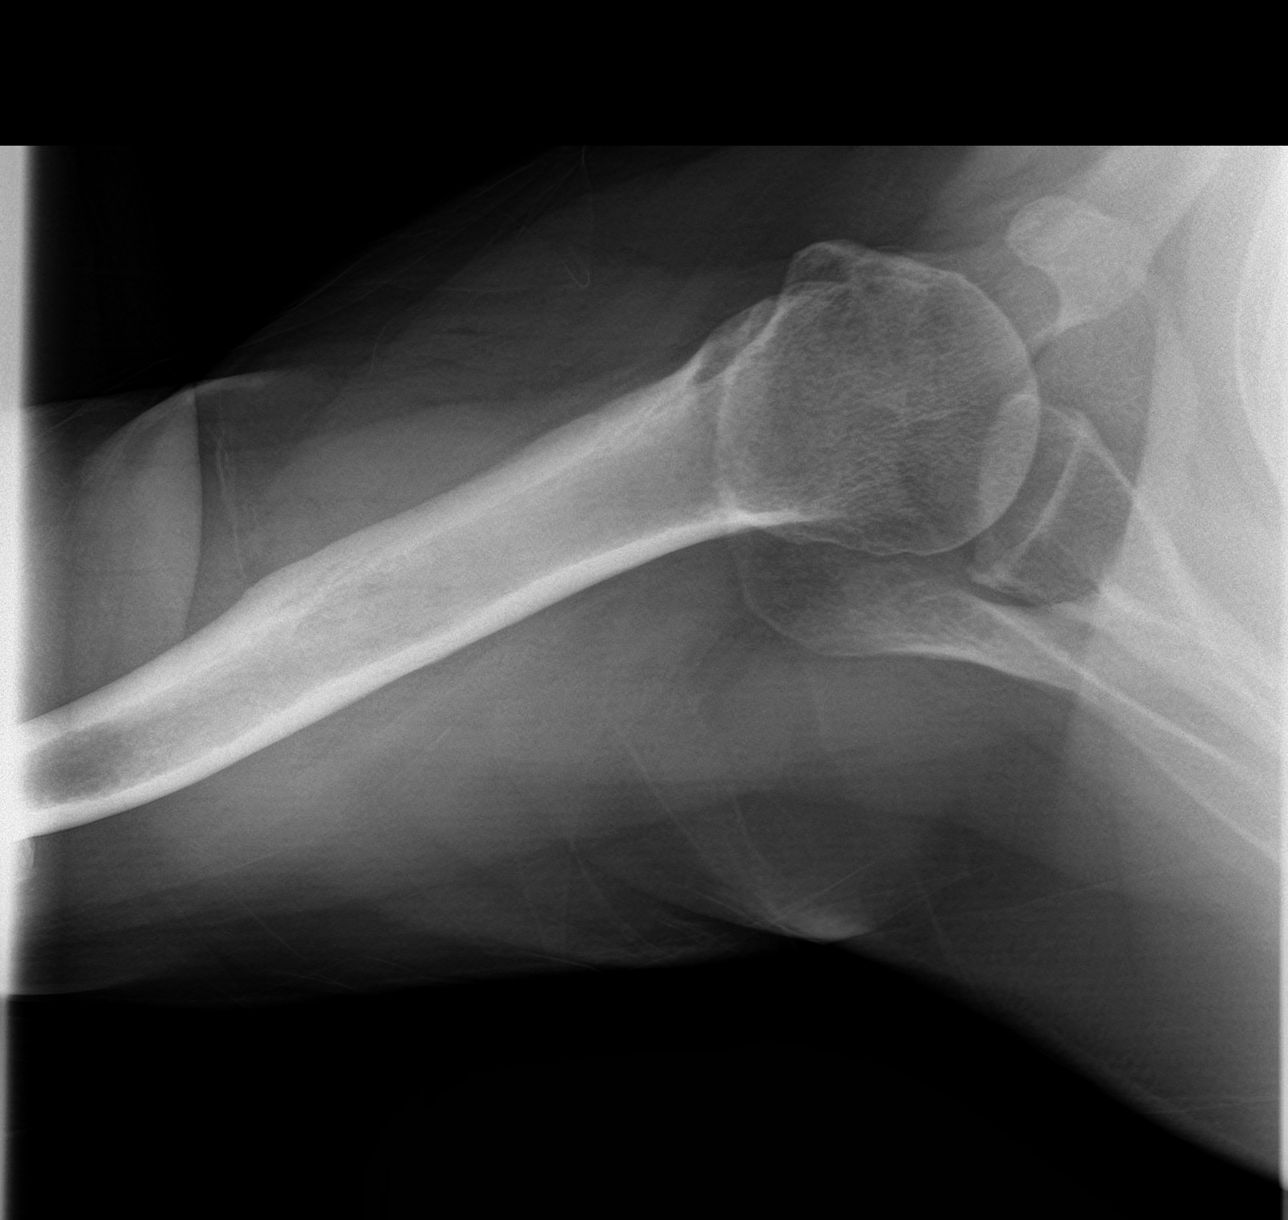

[3 of 3 positions shown; findings below may reference images not displayed]

FINDINGS: Mild AC joint degenerative change. No acute fracture or
malalignment.
IMPRESSION: Mild AC joint degenerative change.  No acute osseous abnormality

## 2019-07-02 ENCOUNTER — Other Ambulatory Visit: Payer: Self-pay | Admitting: Adult Health

## 2019-07-04 ENCOUNTER — Other Ambulatory Visit: Payer: Self-pay | Admitting: Internal Medicine

## 2019-07-04 ENCOUNTER — Other Ambulatory Visit: Payer: Self-pay | Admitting: Nurse Practitioner

## 2019-07-04 DIAGNOSIS — Z1231 Encounter for screening mammogram for malignant neoplasm of breast: Secondary | ICD-10-CM

## 2019-07-08 ENCOUNTER — Other Ambulatory Visit: Payer: Self-pay | Admitting: Adult Health

## 2019-07-30 ENCOUNTER — Other Ambulatory Visit: Payer: Self-pay

## 2019-07-30 MED ORDER — AMITRIPTYLINE HCL 10 MG PO TABS: 10.0000 mg | ORAL_TABLET | Freq: Every day | ORAL | 3 refills | Status: DC

## 2019-08-05 DIAGNOSIS — E119 Type 2 diabetes mellitus without complications: Secondary | ICD-10-CM | POA: Diagnosis not present

## 2019-08-05 DIAGNOSIS — H43393 Other vitreous opacities, bilateral: Secondary | ICD-10-CM | POA: Diagnosis not present

## 2019-08-07 ENCOUNTER — Other Ambulatory Visit: Payer: Self-pay | Admitting: Nurse Practitioner

## 2019-08-07 MED ORDER — LEVEMIR FLEXTOUCH 100 UNIT/ML ~~LOC~~ SOPN: 32.0000 [IU] | PEN_INJECTOR | Freq: Every day | SUBCUTANEOUS | 1 refills | Status: DC

## 2019-08-11 ENCOUNTER — Other Ambulatory Visit: Payer: Self-pay | Admitting: Internal Medicine

## 2019-08-11 DIAGNOSIS — E1140 Type 2 diabetes mellitus with diabetic neuropathy, unspecified: Secondary | ICD-10-CM

## 2019-08-11 MED ORDER — METOPROLOL TARTRATE 100 MG PO TABS: 100.0000 mg | ORAL_TABLET | Freq: Two times a day (BID) | ORAL | 6 refills | Status: DC

## 2019-08-11 MED ORDER — GABAPENTIN 600 MG PO TABS: ORAL_TABLET | ORAL | 2 refills | Status: DC

## 2019-08-15 ENCOUNTER — Other Ambulatory Visit: Payer: Self-pay | Admitting: Internal Medicine

## 2019-08-15 MED ORDER — LEVEMIR FLEXTOUCH 100 UNIT/ML ~~LOC~~ SOPN: 32.0000 [IU] | PEN_INJECTOR | Freq: Every day | SUBCUTANEOUS | 1 refills | Status: DC

## 2019-08-20 ENCOUNTER — Other Ambulatory Visit: Payer: Self-pay

## 2019-08-20 MED ORDER — MELOXICAM 7.5 MG PO TABS: 7.5000 mg | ORAL_TABLET | Freq: Two times a day (BID) | ORAL | 5 refills | Status: DC

## 2019-08-20 MED ORDER — GLIMEPIRIDE 2 MG PO TABS: 2.0000 mg | ORAL_TABLET | Freq: Two times a day (BID) | ORAL | 3 refills | Status: DC

## 2019-08-27 ENCOUNTER — Other Ambulatory Visit: Payer: Self-pay

## 2019-08-27 ENCOUNTER — Ambulatory Visit
Admission: RE | Admit: 2019-08-27 | Discharge: 2019-08-27 | Disposition: A | Discharge status: Home / Self Care | Payer: BC Managed Care – PPO | Source: Ambulatory Visit | Attending: Nurse Practitioner | Admitting: Nurse Practitioner

## 2019-08-27 DIAGNOSIS — Z1231 Encounter for screening mammogram for malignant neoplasm of breast: Secondary | ICD-10-CM

## 2019-08-27 DIAGNOSIS — K219 Gastro-esophageal reflux disease without esophagitis: Secondary | ICD-10-CM

## 2019-08-27 MED ORDER — OMEPRAZOLE 20 MG PO CPDR: 20.0000 mg | DELAYED_RELEASE_CAPSULE | Freq: Every day | ORAL | 3 refills | Status: DC

## 2019-08-27 MED ORDER — METFORMIN HCL 500 MG PO TABS: 1000.0000 mg | ORAL_TABLET | Freq: Two times a day (BID) | ORAL | 0 refills | Status: DC

## 2019-08-28 NOTE — Progress Notes (Signed)
Negative mammogram

## 2019-09-15 ENCOUNTER — Other Ambulatory Visit: Payer: Self-pay

## 2019-09-15 MED ORDER — CITALOPRAM HYDROBROMIDE 20 MG PO TABS: ORAL_TABLET | ORAL | 0 refills | Status: DC

## 2019-10-01 ENCOUNTER — Telehealth: Payer: Self-pay

## 2019-10-01 NOTE — Telephone Encounter (Signed)
Confirmed and screened for 10-03-19 ov.

## 2019-10-02 ENCOUNTER — Other Ambulatory Visit: Payer: Self-pay

## 2019-10-02 ENCOUNTER — Encounter: Payer: Self-pay | Admitting: Nurse Practitioner

## 2019-10-02 ENCOUNTER — Ambulatory Visit: Payer: BC Managed Care – PPO | Admitting: Nurse Practitioner

## 2019-10-02 VITALS — BP 115/69 | HR 79 | Resp 16 | Ht 65.0 in | Wt 173.0 lb

## 2019-10-02 DIAGNOSIS — K219 Gastro-esophageal reflux disease without esophagitis: Secondary | ICD-10-CM

## 2019-10-02 DIAGNOSIS — E1165 Type 2 diabetes mellitus with hyperglycemia: Secondary | ICD-10-CM

## 2019-10-02 DIAGNOSIS — I10 Essential (primary) hypertension: Secondary | ICD-10-CM

## 2019-10-02 LAB — POCT GLYCOSYLATED HEMOGLOBIN (HGB A1C): Hemoglobin A1C: 6.9 % — AB (ref 4.0–5.6)

## 2019-10-02 NOTE — Progress Notes (Signed)
[facility] [place], [place] [address]  Internal MEDICINE  Office Visit Note  Patient Name: Belinda Soto  [id number]  TO:[id number]  Date of Service: 10/05/2019  No chief complaint on file.   Ms. Santoso presents today for a diabetes follow-up. HgbA1c is 6.9 today. It was 6.3 on her last visit. She states that she indulged a bit over the holidays. She is experiencing burning and tingling in her fingers and toes. She also experiences intermittent numbness in her toes. She denies any other issues at this time.      Current Medication: Outpatient Encounter Medications as of 10/02/2019  Medication Sig  . amitriptyline (ELAVIL) 10 MG tablet Take 1 tablet (10 mg total) by mouth at bedtime.  Marland Kitchen amLODipine (NORVASC) 2.5 MG tablet Take 1 tablet by mouth once daily  . aspirin 81 MG tablet Take 81 mg by mouth daily.  . bacitracin ophthalmic ointment Place 1 application into the right eye 4 (four) times daily. apply to right eye  . calcium carbonate (TUMS - DOSED IN MG ELEMENTAL CALCIUM) 500 MG chewable tablet Chew 1 tablet by mouth as needed for indigestion or heartburn.  . Cholecalciferol (VITAMIN D PO) Take 1,000 Units by mouth daily.   . citalopram (CELEXA) 20 MG tablet TAKE 1 TABLET BY MOUTH ONCE DAILY AROUND  7-8PM  FOR  DEPRESSION  . fluticasone (FLONASE) 50 MCG/ACT nasal spray Place 2 sprays into both nostrils daily.  Marland Kitchen gabapentin (NEURONTIN) 600 MG tablet Take 1.5 tablets qam and qpm and 2 tablets po QHS  . gentamicin (GARAMYCIN) 0.3 % ophthalmic solution Place 2 drops into both eyes every 4 (four) hours.  Marland Kitchen glimepiride (AMARYL) 2 MG tablet Take 1 tablet (2 mg total) by mouth 2 (two) times daily.  . hydrochlorothiazide (HYDRODIURIL) 25 MG tablet TAKE 1 TABLET BY MOUTH ONCE DAILY IN THE MORNING  . hydrocortisone 2.5 % ointment Apply topically.  . Insulin Detemir (LEVEMIR FLEXTOUCH) 100 UNIT/ML Pen Inject 32 Units into the skin at bedtime.  . liraglutide (VICTOZA)  18 MG/3ML SOPN Inject 1.8 mg subcutaneously once daily  . losartan (COZAAR) 100 MG tablet Take 1 tablet (100 mg total) by mouth every morning.  . meloxicam (MOBIC) 7.5 MG tablet Take 1 tablet (7.5 mg total) by mouth 2 (two) times daily.  . metFORMIN (GLUCOPHAGE) 500 MG tablet Take 2 tablets (1,000 mg total) by mouth 2 (two) times daily.  . metoprolol tartrate (LOPRESSOR) 100 MG tablet Take 1 tablet (100 mg total) by mouth 2 (two) times daily.  Marland Kitchen NOVOFINE 32G X 6 MM MISC USE WITH VICTOZA ONCE DAILY  . nystatin (MYCOSTATIN) 100000 UNIT/ML suspension Take 5 mLs (500,000 Units total) by mouth 4 (four) times daily.  Marland Kitchen omeprazole (PRILOSEC) 20 MG capsule Take 1 capsule (20 mg total) by mouth daily.   No facility-administered encounter medications on file as of 10/02/2019.    Surgical History: Past Surgical History:  Procedure Laterality Date  . c sections    . COLONOSCOPY N/A 03/08/2015   Procedure: COLONOSCOPY;  Surgeon: Hulen Luster, MD;  Location: [facility] ENDOSCOPY;  Service: Gastroenterology;  Laterality: N/A;  . COLONOSCOPY  02/2015  . FISSURECTOMY N/A 08/24/2017   Procedure: FISSURECTOMY;  Surgeon: Christene Lye, MD;  Location: [facility] ORS;  Service: General;  Laterality: N/A;  . FOOT SURGERY Left 2005  . SPHINCTEROTOMY N/A 08/24/2017   Procedure: SPHINCTEROTOMY;  Surgeon: Christene Lye, MD;  Location: [facility] ORS;  Service: General;  Laterality: N/A;  Medical History: Past Medical History:  Diagnosis Date  . Arthritis   . Depression   . Diabetes mellitus without complication ([place])   . Dysrhythmia    palpitations  . GERD (gastroesophageal reflux disease)    TUMS PRN  . Headache    H/O MIGRAINES  . History of kidney stones   . Hypertension   . Palpitations     Family History: Family History  Problem Relation Age of Onset  . Diabetes Mother   . Hypertension Mother   . Glaucoma Maternal Grandmother   . Alzheimer's disease Maternal Grandmother   . Breast cancer Neg  Hx     Social History   Socioeconomic History  . Marital status: Married    Spouse name: Not on file  . Number of children: Not on file  . Years of education: Not on file  . Highest education level: Not on file  Occupational History  . Not on file  Tobacco Use  . Smoking status: Former Smoker    Packs/day: 0.50    Years: 12.00    Pack years: 6.00    Types: Cigarettes    Quit date: 08/20/1985    Years since quitting: 34.1  . Smokeless tobacco: Never Used  Substance and Sexual Activity  . Alcohol use: Yes    Comment: rarely  . Drug use: No  . Sexual activity: Not on file  Other Topics Concern  . Not on file  Social History Narrative  . Not on file   Social Determinants of Health   Financial Resource Strain:   . Difficulty of Paying Living Expenses: Not on file  Food Insecurity:   . Worried About Charity fundraiser in the Last Year: Not on file  . Ran Out of Food in the Last Year: Not on file  Transportation Needs:   . Lack of Transportation (Medical): Not on file  . Lack of Transportation (Non-Medical): Not on file  Physical Activity:   . Days of Exercise per Week: Not on file  . Minutes of Exercise per Session: Not on file  Stress:   . Feeling of Stress : Not on file  Social Connections:   . Frequency of Communication with Friends and Family: Not on file  . Frequency of Social Gatherings with Friends and Family: Not on file  . Attends Religious Services: Not on file  . Active Member of Clubs or Organizations: Not on file  . Attends Archivist Meetings: Not on file  . Marital Status: Not on file  Intimate Partner Violence:   . Fear of Current or Ex-Partner: Not on file  . Emotionally Abused: Not on file  . Physically Abused: Not on file  . Sexually Abused: Not on file      Review of Systems  Constitutional: Negative for chills, fatigue and fever.  HENT: Negative for congestion, postnasal drip, rhinorrhea, sinus pressure, sinus pain and sore  throat.   Eyes:       Last annual eye exam in December 2020  Respiratory: Negative for shortness of breath and wheezing.   Cardiovascular: Negative for chest pain and palpitations.  Gastrointestinal: Negative for constipation, diarrhea, nausea and vomiting.  Endocrine: Negative for cold intolerance, heat intolerance, polydipsia and polyuria.       Occasional "very bad cotton mouth"  Allergic/Immunologic: Negative for environmental allergies.  Neurological: Negative for dizziness and headaches.  Hematological: Negative for adenopathy.  Psychiatric/Behavioral: Negative for dysphoric mood. The patient is not nervous/anxious.  Today's Vitals   10/02/19 1608  BP: 115/69  Pulse: 79  Resp: 16  SpO2: 100%  Weight: 173 lb (78.5 kg)  Height: 5\' 5"  (1.651 m)   Body mass index is 28.79 kg/m.  Physical Exam Vitals and nursing note reviewed.  Constitutional:      General: She is not in acute distress.    Appearance: Normal appearance.  HENT:     Head: Normocephalic and atraumatic.     Nose: Nose normal.  Eyes:     Pupils: Pupils are equal, round, and reactive to light.  Cardiovascular:     Rate and Rhythm: Normal rate and regular rhythm.     Pulses:          Dorsalis pedis pulses are 1+ on the right side and 1+ on the left side.       Posterior tibial pulses are 2+ on the right side and 2+ on the left side.     Heart sounds: Normal heart sounds.  Pulmonary:     Effort: Pulmonary effort is normal.     Breath sounds: Normal breath sounds.  Abdominal:     Palpations: Abdomen is soft.  Musculoskeletal:     Cervical back: Normal range of motion and neck supple.       Feet:  Feet:     Right foot:     Protective Sensation: 10 sites tested. 9 sites sensed.     Skin integrity: Callus present.     Left foot:     Protective Sensation: 10 sites tested. 9 sites sensed.     Skin integrity: Skin integrity normal.     Comments: Decreased sensation along the plantar surfaces of both  feet, just proximal of the toes Skin:    General: Skin is warm and dry.     Capillary Refill: Capillary refill takes less than 2 seconds.  Neurological:     Mental Status: She is alert and oriented to person, place, and time.     Gait: Gait normal.  Psychiatric:        Mood and Affect: Mood normal.        Behavior: Behavior normal.        Thought Content: Thought content normal.        Judgment: Judgment normal.    Assessment/Plan: 1. Type 2 diabetes mellitus with hyperglycemia, without long-term current use of insulin (HCC) - POCT HgB A1C 6.9 today. Continue all diabetic medication as prescribed   2. Essential hypertension Stable. Continue bp medication as prescribed   3. Gastroesophageal reflux disease without esophagitis Continue to take omeprazole as prescribe   General Counseling: chinita tinari understanding of the findings of todays visit and agrees with plan of treatment. I have discussed any further diagnostic evaluation that may be needed or ordered today. We also reviewed her medications today. she has been encouraged to call the office with any questions or concerns that should arise related to todays visit.  Diabetes Counseling:  1. Addition of ACE inh/ ARB'S for nephroprotection. Microalbumin is updated  2. Diabetic foot care, prevention of complications. Podiatry consult 3. Exercise and lose weight.  4. Diabetic eye examination, Diabetic eye exam is updated  5. Monitor blood sugar closlely. nutrition counseling.  6. Sign and symptoms of hypoglycemia including shaking sweating,confusion and headaches.  This patient was seen by Leretha Pol FNP Collaboration with Dr Lavera Guise as a part of collaborative care agreement  Orders Placed This Encounter  Procedures  . POCT HgB A1C  Total Time spent: 55 Minutes      Dr Lavera Guise Internal medicine

## 2019-10-03 ENCOUNTER — Ambulatory Visit: Payer: BC Managed Care – PPO | Admitting: Nurse Practitioner

## 2019-10-16 ENCOUNTER — Other Ambulatory Visit: Payer: Self-pay | Admitting: Adult Health

## 2019-10-18 ENCOUNTER — Other Ambulatory Visit: Payer: Self-pay | Admitting: Adult Health

## 2019-11-09 ENCOUNTER — Other Ambulatory Visit: Payer: Self-pay | Admitting: Internal Medicine

## 2019-11-09 DIAGNOSIS — E1140 Type 2 diabetes mellitus with diabetic neuropathy, unspecified: Secondary | ICD-10-CM

## 2019-11-28 ENCOUNTER — Other Ambulatory Visit: Payer: Self-pay

## 2019-11-28 MED ORDER — METFORMIN HCL 500 MG PO TABS: 1000.0000 mg | ORAL_TABLET | Freq: Two times a day (BID) | ORAL | 0 refills | Status: DC

## 2019-12-01 ENCOUNTER — Other Ambulatory Visit: Payer: Self-pay

## 2019-12-01 MED ORDER — AMITRIPTYLINE HCL 10 MG PO TABS: 10.0000 mg | ORAL_TABLET | Freq: Every day | ORAL | 3 refills | Status: DC

## 2019-12-08 ENCOUNTER — Other Ambulatory Visit: Payer: Self-pay

## 2019-12-08 DIAGNOSIS — E1140 Type 2 diabetes mellitus with diabetic neuropathy, unspecified: Secondary | ICD-10-CM

## 2019-12-08 MED ORDER — GABAPENTIN 600 MG PO TABS: ORAL_TABLET | ORAL | 0 refills | Status: DC

## 2019-12-22 ENCOUNTER — Other Ambulatory Visit: Payer: Self-pay

## 2019-12-22 MED ORDER — CITALOPRAM HYDROBROMIDE 20 MG PO TABS: ORAL_TABLET | ORAL | 0 refills | Status: DC

## 2019-12-29 ENCOUNTER — Other Ambulatory Visit: Payer: Self-pay

## 2019-12-29 MED ORDER — GLIMEPIRIDE 2 MG PO TABS: 2.0000 mg | ORAL_TABLET | Freq: Two times a day (BID) | ORAL | 3 refills | Status: DC

## 2020-01-05 ENCOUNTER — Other Ambulatory Visit: Payer: Self-pay

## 2020-01-05 DIAGNOSIS — E1140 Type 2 diabetes mellitus with diabetic neuropathy, unspecified: Secondary | ICD-10-CM

## 2020-01-05 DIAGNOSIS — K219 Gastro-esophageal reflux disease without esophagitis: Secondary | ICD-10-CM

## 2020-01-05 MED ORDER — GABAPENTIN 600 MG PO TABS: ORAL_TABLET | ORAL | 3 refills | Status: DC

## 2020-01-05 MED ORDER — OMEPRAZOLE 20 MG PO CPDR: 20.0000 mg | DELAYED_RELEASE_CAPSULE | Freq: Every day | ORAL | 3 refills | Status: DC

## 2020-01-05 MED ORDER — HYDROCHLOROTHIAZIDE 25 MG PO TABS: ORAL_TABLET | ORAL | 1 refills | Status: DC

## 2020-01-05 MED ORDER — AMLODIPINE BESYLATE 2.5 MG PO TABS: 2.5000 mg | ORAL_TABLET | Freq: Every day | ORAL | 1 refills | Status: DC

## 2020-01-05 MED ORDER — NOVOFINE 32G X 6 MM MISC: 3 refills | Status: DC

## 2020-01-19 ENCOUNTER — Other Ambulatory Visit: Payer: Self-pay

## 2020-01-19 MED ORDER — METFORMIN HCL 500 MG PO TABS: 1000.0000 mg | ORAL_TABLET | Freq: Two times a day (BID) | ORAL | 0 refills | Status: DC

## 2020-01-23 ENCOUNTER — Telehealth: Payer: Self-pay

## 2020-01-23 NOTE — Telephone Encounter (Signed)
Confirmed and screened for 01-27-20 ov. 

## 2020-01-27 ENCOUNTER — Other Ambulatory Visit: Payer: Self-pay

## 2020-01-27 ENCOUNTER — Ambulatory Visit (INDEPENDENT_AMBULATORY_CARE_PROVIDER_SITE_OTHER): Payer: BC Managed Care – PPO | Admitting: Nurse Practitioner

## 2020-01-27 ENCOUNTER — Encounter: Payer: Self-pay | Admitting: Nurse Practitioner

## 2020-01-27 VITALS — BP 123/71 | HR 90 | Temp 97.4°F | Resp 16 | Ht 65.0 in | Wt 171.0 lb

## 2020-01-27 DIAGNOSIS — L409 Psoriasis, unspecified: Secondary | ICD-10-CM | POA: Diagnosis not present

## 2020-01-27 DIAGNOSIS — I10 Essential (primary) hypertension: Secondary | ICD-10-CM

## 2020-01-27 DIAGNOSIS — Z0001 Encounter for general adult medical examination with abnormal findings: Secondary | ICD-10-CM

## 2020-01-27 DIAGNOSIS — I8311 Varicose veins of right lower extremity with inflammation: Secondary | ICD-10-CM | POA: Diagnosis not present

## 2020-01-27 DIAGNOSIS — E1140 Type 2 diabetes mellitus with diabetic neuropathy, unspecified: Secondary | ICD-10-CM | POA: Diagnosis not present

## 2020-01-27 DIAGNOSIS — R300 Dysuria: Secondary | ICD-10-CM | POA: Diagnosis not present

## 2020-01-27 DIAGNOSIS — E1165 Type 2 diabetes mellitus with hyperglycemia: Secondary | ICD-10-CM

## 2020-01-27 DIAGNOSIS — I8312 Varicose veins of left lower extremity with inflammation: Secondary | ICD-10-CM

## 2020-01-27 LAB — POCT GLYCOSYLATED HEMOGLOBIN (HGB A1C): Hemoglobin A1C: 6.7 % — AB (ref 4.0–5.6)

## 2020-01-27 MED ORDER — CALCIPOTRIENE-BETAMETH DIPROP 0.005-0.064 % EX OINT: TOPICAL_OINTMENT | Freq: Every day | CUTANEOUS | 2 refills | Status: DC

## 2020-01-27 MED ORDER — GABAPENTIN 600 MG PO TABS: ORAL_TABLET | ORAL | 3 refills | Status: DC

## 2020-01-27 MED ORDER — NOVOFINE 32G X 6 MM MISC: 3 refills | Status: AC

## 2020-01-27 NOTE — Progress Notes (Signed)
[facility] [place], [place] [address]  Internal MEDICINE  Office Visit Note  Patient Name: Belinda Soto  [id number]  [id number]  Date of Service: 02/08/2020  Chief Complaint  Patient presents with  . Annual Exam  . Diabetes  . Hypertension  . Gastroesophageal Reflux  . Arthritis  . Depression  . Medication Refill    Amtriptylline     The patient is here for health maintenance exam. Blood sugars are doing well. Her HgbA1c is 6.7 today. She is c/o severe varicose veins in both legs, bilaterally. Initially made referral to see vein and vascular in 05/2019 and patient never received appointment. She has symptomatic, painful, bruised appearing lower legs due to varicose veins. She does wear compression socks to work every day and takes them off prior to going to bed. She is due to have routine fasting labs.   Pt is here for routine health maintenance examination  Current Medication: Outpatient Encounter Medications as of 01/27/2020  Medication Sig  . amitriptyline (ELAVIL) 10 MG tablet Take 1 tablet (10 mg total) by mouth at bedtime.  Marland Kitchen amLODipine (NORVASC) 2.5 MG tablet Take 1 tablet (2.5 mg total) by mouth daily.  Marland Kitchen aspirin 81 MG tablet Take 81 mg by mouth daily.  . Cholecalciferol (VITAMIN D PO) Take 1,000 Units by mouth daily.   . citalopram (CELEXA) 20 MG tablet TAKE 1 TABLET BY MOUTH ONCE DAILY AROUND  7-8PM  FOR  DEPRESSION  . fluticasone (FLONASE) 50 MCG/ACT nasal spray Place 2 sprays into both nostrils daily.  Marland Kitchen gabapentin (NEURONTIN) 600 MG tablet Take 2 tablets po TID  . glimepiride (AMARYL) 2 MG tablet Take 1 tablet (2 mg total) by mouth 2 (two) times daily.  . hydrochlorothiazide (HYDRODIURIL) 25 MG tablet TAKE 1 TABLET BY MOUTH ONCE DAILY IN THE MORNING  . hydrocortisone 2.5 % ointment Apply topically.  . liraglutide (VICTOZA) 18 MG/3ML SOPN Inject 1.8 mg subcutaneously once daily  . losartan (COZAAR) 100 MG tablet Take 1 tablet (100 mg  total) by mouth every morning.  . meloxicam (MOBIC) 7.5 MG tablet Take 1 tablet (7.5 mg total) by mouth 2 (two) times daily.  . metFORMIN (GLUCOPHAGE) 500 MG tablet Take 2 tablets (1,000 mg total) by mouth 2 (two) times daily.  . metoprolol tartrate (LOPRESSOR) 100 MG tablet Take 1 tablet (100 mg total) by mouth 2 (two) times daily.  Marland Kitchen omeprazole (PRILOSEC) 20 MG capsule Take 1 capsule (20 mg total) by mouth daily.  . [DISCONTINUED] gabapentin (NEURONTIN) 600 MG tablet TAKE ONE AND ONE-HALF TABLETS BY MOUTH IN THE MORNING AND EVENING AND TAKE TWO TABLETS BY MOUTH AT BEDTIME  . [DISCONTINUED] Insulin Detemir (LEVEMIR FLEXTOUCH) 100 UNIT/ML Pen Inject 32 Units into the skin at bedtime.  . [DISCONTINUED] Insulin Pen Needle (NOVOFINE) 32G X 6 MM MISC USE WITH VICTOZA ONCE DAILY  . calcipotriene-betamethasone (TACLONEX) ointment Apply topically daily.  . calcium carbonate (TUMS - DOSED IN MG ELEMENTAL CALCIUM) 500 MG chewable tablet Chew 1 tablet by mouth as needed for indigestion or heartburn.  . [DISCONTINUED] bacitracin ophthalmic ointment Place 1 application into the right eye 4 (four) times daily. apply to right eye  . [DISCONTINUED] gentamicin (GARAMYCIN) 0.3 % ophthalmic solution Place 2 drops into both eyes every 4 (four) hours.  . [DISCONTINUED] nystatin (MYCOSTATIN) 100000 UNIT/ML suspension Take 5 mLs (500,000 Units total) by mouth 4 (four) times daily.   No facility-administered encounter medications on file as of 01/27/2020.  Surgical History: Past Surgical History:  Procedure Laterality Date  . c sections    . COLONOSCOPY N/A 03/08/2015   Procedure: COLONOSCOPY;  Surgeon: Hulen Luster, MD;  Location: [facility] ENDOSCOPY;  Service: Gastroenterology;  Laterality: N/A;  . COLONOSCOPY  02/2015  . FISSURECTOMY N/A 08/24/2017   Procedure: FISSURECTOMY;  Surgeon: Christene Lye, MD;  Location: [facility] ORS;  Service: General;  Laterality: N/A;  . FOOT SURGERY Left 2005  . SPHINCTEROTOMY N/A  08/24/2017   Procedure: SPHINCTEROTOMY;  Surgeon: Christene Lye, MD;  Location: [facility] ORS;  Service: General;  Laterality: N/A;    Medical History: Past Medical History:  Diagnosis Date  . Arthritis   . Depression   . Diabetes mellitus without complication ([place])   . Dysrhythmia    palpitations  . GERD (gastroesophageal reflux disease)    TUMS PRN  . Headache    H/O MIGRAINES  . History of kidney stones   . Hypertension   . Palpitations     Family History: Family History  Problem Relation Age of Onset  . Diabetes Mother   . Hypertension Mother   . Glaucoma Maternal Grandmother   . Alzheimer's disease Maternal Grandmother   . Breast cancer Neg Hx       Review of Systems  Constitutional: Negative for activity change, chills, fatigue and unexpected weight change.  HENT: Negative for congestion, postnasal drip, rhinorrhea, sneezing and sore throat.   Respiratory: Negative for cough, chest tightness, shortness of breath and wheezing.   Cardiovascular: Negative for chest pain and palpitations.       Varicose veins   Gastrointestinal: Negative for abdominal pain, constipation, diarrhea, nausea and vomiting.  Endocrine: Negative for cold intolerance, heat intolerance, polydipsia and polyuria.       Blood sugars doing well   Genitourinary: Negative for dysuria and frequency.  Musculoskeletal: Negative for arthralgias, back pain, joint swelling and neck pain.  Skin: Negative for rash.  Allergic/Immunologic: Negative for environmental allergies, food allergies and immunocompromised state.  Neurological: Positive for numbness. Negative for dizziness and tremors.       Burning sensation in both feet.   Hematological: Negative for adenopathy. Does not bruise/bleed easily.  Psychiatric/Behavioral: Negative for behavioral problems (Depression), sleep disturbance and suicidal ideas. The patient is not nervous/anxious.     Today's Vitals   01/27/20 1544  BP: 123/71  Pulse:  90  Resp: 16  Temp: (!) 97.4 F (36.3 C)  SpO2: 97%  Weight: 171 lb (77.6 kg)  Height: _0  (1.651 m)   Body mass index is 28.46 kg/m.  Physical Exam Vitals and nursing note reviewed.  Constitutional:      General: She is not in acute distress.    Appearance: Normal appearance.  HENT:     Head: Normocephalic and atraumatic.     Nose: Nose normal.  Eyes:     Conjunctiva/sclera: Conjunctivae normal.     Pupils: Pupils are equal, round, and reactive to light.  Neck:     Vascular: No carotid bruit.  Cardiovascular:     Rate and Rhythm: Normal rate and regular rhythm.     Pulses:          Dorsalis pedis pulses are 1+ on the right side and 1+ on the left side.       Posterior tibial pulses are 2+ on the right side and 2+ on the left side.     Heart sounds: Normal heart sounds.     Comments: Severe varicose veins,  both lower extremities. Tender with bruised type appearance.  Pulmonary:     Effort: Pulmonary effort is normal.     Breath sounds: Normal breath sounds.  Chest:     Breasts:        Right: Normal. No swelling, bleeding, inverted nipple, mass, nipple discharge, skin change or tenderness.        Left: Normal. No swelling, bleeding, inverted nipple, mass, nipple discharge, skin change or tenderness.  Abdominal:     General: Bowel sounds are normal.     Palpations: Abdomen is soft.     Tenderness: There is no abdominal tenderness.  Musculoskeletal:     Cervical back: Normal range of motion and neck supple.       Feet:  Feet:     Right foot:     Protective Sensation: 10 sites tested. 9 sites sensed.     Skin integrity: Callus present.     Left foot:     Protective Sensation: 10 sites tested. 9 sites sensed.     Skin integrity: Skin integrity normal.     Comments: Decreased sensation along the plantar surfaces of both feet, just proximal of the toes Lymphadenopathy:     Upper Body:     Right upper body: No axillary adenopathy.     Left upper body: No axillary  adenopathy.  Skin:    General: Skin is warm and dry.     Capillary Refill: Capillary refill takes less than 2 seconds.  Neurological:     Mental Status: She is alert and oriented to person, place, and time.     Gait: Gait normal.  Psychiatric:        Mood and Affect: Mood normal.        Behavior: Behavior normal.        Thought Content: Thought content normal.        Judgment: Judgment normal.    LABS: Recent Results (from the past 2160 hour(s))  Urinalysis, Routine w reflex microscopic     Status: Abnormal   Collection Time: 01/27/20  3:43 PM  Result Value Ref Range   Specific Gravity, UA 1.022 1.005 - 1.030   pH, UA 5.0 5.0 - 7.5   Color, UA Yellow Yellow   Appearance Ur Clear Clear   Leukocytes,UA 1+ (A) Negative   Protein,UA Negative Negative/Trace   Glucose, UA Trace (A) Negative   Ketones, UA Negative Negative   RBC, UA Negative Negative   Bilirubin, UA Negative Negative   Urobilinogen, Ur 0.2 0.2 - 1.0 mg/dL   Nitrite, UA Negative Negative   Microscopic Examination See below:     Comment: Microscopic was indicated and was performed.  Microscopic Examination     Status: Abnormal   Collection Time: 01/27/20  3:43 PM   URINE  Result Value Ref Range   WBC, UA 6-10 (A) 0 - 5 /hpf   RBC None seen 0 - 2 /hpf   Epithelial Cells (non renal) 0-10 0 - 10 /hpf   Casts None seen None seen /lpf   Bacteria, UA None seen None seen/Few  POCT HgB A1C     Status: Abnormal   Collection Time: 01/27/20  4:14 PM  Result Value Ref Range   Hemoglobin A1C 6.7 (A) 4.0 - 5.6 %   HbA1c POC (<> result, manual entry)     HbA1c, POC (prediabetic range)     HbA1c, POC (controlled diabetic range)    Comprehensive metabolic panel     Status: Abnormal  Collection Time: 01/28/20  9:47 AM  Result Value Ref Range   Glucose 80 65 - 99 mg/dL   BUN 19 8 - 27 mg/dL   Creatinine, Ser 0.84 0.57 - 1.00 mg/dL   GFR calc non Af Amer 75 >59 mL/min/1.73   GFR calc Af Amer 87 >59 mL/min/1.73     Comment: **[facility] currently reports eGFR in compliance with the current**   recommendations of the [employer]. [facility] will   update reporting as new guidelines are published from the NKF-ASN   Task force.    BUN/Creatinine Ratio 23 12 - 28   Sodium 137 134 - 144 mmol/L   Potassium 4.1 3.5 - 5.2 mmol/L   Chloride 97 96 - 106 mmol/L   CO2 23 20 - 29 mmol/L   Calcium 9.7 8.7 - 10.3 mg/dL   Total Protein 7.3 6.0 - 8.5 g/dL   Albumin 4.4 3.8 - 4.8 g/dL   Globulin, Total 2.9 1.5 - 4.5 g/dL   Albumin/Globulin Ratio 1.5 1.2 - 2.2   Bilirubin Total 0.3 0.0 - 1.2 mg/dL   Alkaline Phosphatase 88 39 - 117 IU/L   AST 60 (H) 0 - 40 IU/L   ALT 40 (H) 0 - 32 IU/L  CBC     Status: Abnormal   Collection Time: 01/28/20  9:47 AM  Result Value Ref Range   WBC 6.7 3.4 - 10.8 x10E3/uL   RBC 4.02 3.77 - 5.28 x10E6/uL   Hemoglobin 10.9 (L) 11.1 - 15.9 g/dL   Hematocrit 33.0 (L) 34.0 - 46.6 %   MCV 82 79 - 97 fL   MCH 27.1 26.6 - 33.0 pg   MCHC 33.0 31.5 - 35.7 g/dL   RDW 12.1 11.7 - 15.4 %   Platelets 233 150 - 450 x10E3/uL  Lipid Panel With LDL/HDL Ratio     Status: Abnormal   Collection Time: 01/28/20  9:47 AM  Result Value Ref Range   Cholesterol, Total 144 100 - 199 mg/dL   Triglycerides 114 0 - 149 mg/dL   HDL 37 (L) >39 mg/dL   VLDL Cholesterol Cal 21 5 - 40 mg/dL   LDL Chol Calc (NIH) 86 0 - 99 mg/dL   LDL/HDL Ratio 2.3 0.0 - 3.2 ratio    Comment:                                     LDL/HDL Ratio                                             Men  Women                               1/2 Avg.Risk  1.0    1.5                                   Avg.Risk  3.6    3.2                                2X Avg.Risk  6.2    5.0  3X Avg.Risk  8.0    6.1   T4, free     Status: None   Collection Time: 01/28/20  9:47 AM  Result Value Ref Range   Free T4 1.24 0.82 - 1.77 ng/dL  TSH     Status: None   Collection Time: 01/28/20  9:47 AM  Result Value Ref  Range   TSH 0.640 0.450 - 4.500 uIU/mL  VITAMIN D 25 Hydroxy (Vit-D Deficiency, Fractures)     Status: None   Collection Time: 01/28/20  9:47 AM  Result Value Ref Range   Vit D, 25-Hydroxy 41.3 30.0 - 100.0 ng/mL    Comment: Vitamin D deficiency has been defined by the [place] and an Endocrine Society practice guideline as a level of serum 25-OH vitamin D less than 20 ng/mL (1,2). The Endocrine Society went on to further define vitamin D insufficiency as a level between 21 and 29 ng/mL (2). 1. IOM (Institute of Medicine). 2010. Dietary reference    intakes for calcium and D. [place]: The    [employer]. 2. Holick MF, Binkley Wilbur, Bischoff-Ferrari HA, et al.    Evaluation, treatment, and prevention of vitamin D    deficiency: an Endocrine Society clinical practice    guideline. JCEM. 2011 Jul; 96(7):1911-30.     Assessment/Plan: 1. Encounter for general adult medical examination with abnormal findings Annual health maintenance exam today.   2. Type 2 diabetes mellitus with diabetic neuropathy, without long-term current use of insulin (HCC) - POCT HgB A1C 6.7 today. Continue diabetic medication as prescribed. May continue gabapentin, increasing dose to 2 tablets up to three times daily for neuropathy.  - gabapentin (NEURONTIN) 600 MG tablet; Take 2 tablets po TID  Dispense: 180 tablet; Refill: 3  3. Varicose veins of both lower extremities with inflammation Refer to vascular surgery for further evaluation.  - Ambulatory referral to Vascular Surgery  4. Psoriasis and similar disorder May use taclonex daily. New prescription sent to pharmacy.  - calcipotriene-betamethasone (TACLONEX) ointment; Apply topically daily.  Dispense: 60 g; Refill: 2  5. Essential hypertension Stable. Continue bp medication as prescribed   6. Dysuria - Urinalysis, Routine w reflex microscopic   General Counseling: ellanie oppedisano understanding of the findings of todays  visit and agrees with plan of treatment. I have discussed any further diagnostic evaluation that may be needed or ordered today. We also reviewed her medications today. she has been encouraged to call the office with any questions or concerns that should arise related to todays visit.    Counseling:  Diabetes Counseling:  1. Addition of ACE inh/ ARB'S for nephroprotection. Microalbumin is updated  2. Diabetic foot care, prevention of complications. Podiatry consult 3. Exercise and lose weight.  4. Diabetic eye examination, Diabetic eye exam is updated  5. Monitor blood sugar closlely. nutrition counseling.  6. Sign and symptoms of hypoglycemia including shaking sweating,confusion and headaches.  This patient was seen by Leretha Pol FNP Collaboration with Dr Lavera Guise as a part of collaborative care agreement  Orders Placed This Encounter  Procedures  . Microscopic Examination  . Urinalysis, Routine w reflex microscopic  . Ambulatory referral to Vascular Surgery  . POCT HgB A1C    Meds ordered this encounter  Medications  . gabapentin (NEURONTIN) 600 MG tablet    Sig: Take 2 tablets po TID    Dispense:  180 tablet    Refill:  3    Order Specific Question:   Supervising Provider  Answer:   Lavera Guise Bassett  . calcipotriene-betamethasone (TACLONEX) ointment    Sig: Apply topically daily.    Dispense:  60 g    Refill:  2    Order Specific Question:   Supervising Provider    Answer:   Lavera Guise [[id number]]    Total time spent: 58 Minutes  Time spent includes review of chart, medications, test results, and follow up plan with the patient.     Lavera Guise, MD  Internal Medicine

## 2020-01-28 ENCOUNTER — Other Ambulatory Visit: Payer: Self-pay

## 2020-01-28 ENCOUNTER — Other Ambulatory Visit: Payer: Self-pay | Admitting: Nurse Practitioner

## 2020-01-28 DIAGNOSIS — I10 Essential (primary) hypertension: Secondary | ICD-10-CM | POA: Diagnosis not present

## 2020-01-28 DIAGNOSIS — E1165 Type 2 diabetes mellitus with hyperglycemia: Secondary | ICD-10-CM | POA: Diagnosis not present

## 2020-01-28 DIAGNOSIS — E559 Vitamin D deficiency, unspecified: Secondary | ICD-10-CM | POA: Diagnosis not present

## 2020-01-28 DIAGNOSIS — Z0001 Encounter for general adult medical examination with abnormal findings: Secondary | ICD-10-CM | POA: Diagnosis not present

## 2020-01-28 LAB — URINALYSIS, ROUTINE W REFLEX MICROSCOPIC
Bilirubin, UA: NEGATIVE
Ketones, UA: NEGATIVE
Nitrite, UA: NEGATIVE
Protein,UA: NEGATIVE
RBC, UA: NEGATIVE
Specific Gravity, UA: 1.022 (ref 1.005–1.030)
Urobilinogen, Ur: 0.2 mg/dL (ref 0.2–1.0)
pH, UA: 5 (ref 5.0–7.5)

## 2020-01-28 LAB — MICROSCOPIC EXAMINATION
Bacteria, UA: NONE SEEN
Casts: NONE SEEN /LPF
RBC, Urine: NONE SEEN /HPF (ref 0–2)

## 2020-01-28 MED ORDER — LEVEMIR FLEXTOUCH 100 UNIT/ML ~~LOC~~ SOPN: 32.0000 [IU] | PEN_INJECTOR | Freq: Every day | SUBCUTANEOUS | 1 refills | Status: DC

## 2020-01-29 LAB — COMPREHENSIVE METABOLIC PANEL WITH GFR
ALT: 40 IU/L — ABNORMAL HIGH (ref 0–32)
AST: 60 IU/L — ABNORMAL HIGH (ref 0–40)
Albumin/Globulin Ratio: 1.5 (ref 1.2–2.2)
Albumin: 4.4 g/dL (ref 3.8–4.8)
Alkaline Phosphatase: 88 IU/L (ref 39–117)
BUN/Creatinine Ratio: 23 (ref 12–28)
BUN: 19 mg/dL (ref 8–27)
Bilirubin Total: 0.3 mg/dL (ref 0.0–1.2)
CO2: 23 mmol/L (ref 20–29)
Calcium: 9.7 mg/dL (ref 8.7–10.3)
Chloride: 97 mmol/L (ref 96–106)
Creatinine, Ser: 0.84 mg/dL (ref 0.57–1.00)
GFR calc Af Amer: 87 mL/min/1.73 (ref >59)
GFR calc non Af Amer: 75 mL/min/1.73 (ref >59)
Globulin, Total: 2.9 g/dL (ref 1.5–4.5)
Glucose: 80 mg/dL (ref 65–99)
Potassium: 4.1 mmol/L (ref 3.5–5.2)
Sodium: 137 mmol/L (ref 134–144)
Total Protein: 7.3 g/dL (ref 6.0–8.5)

## 2020-01-29 LAB — VITAMIN D 25 HYDROXY (VIT D DEFICIENCY, FRACTURES): Vit D, 25-Hydroxy: 41.3 ng/mL (ref 30.0–100.0)

## 2020-01-29 LAB — CBC
Hematocrit: 33 % — ABNORMAL LOW (ref 34.0–46.6)
Hemoglobin: 10.9 g/dL — ABNORMAL LOW (ref 11.1–15.9)
MCH: 27.1 pg (ref 26.6–33.0)
MCHC: 33 g/dL (ref 31.5–35.7)
MCV: 82 fL (ref 79–97)
Platelets: 233 x10E3/uL (ref 150–450)
RBC: 4.02 x10E6/uL (ref 3.77–5.28)
RDW: 12.1 % (ref 11.7–15.4)
WBC: 6.7 x10E3/uL (ref 3.4–10.8)

## 2020-01-29 LAB — LIPID PANEL WITH LDL/HDL RATIO
Cholesterol, Total: 144 mg/dL (ref 100–199)
HDL: 37 mg/dL — ABNORMAL LOW (ref >39)
LDL Chol Calc (NIH): 86 mg/dL (ref 0–99)
LDL/HDL Ratio: 2.3 ratio (ref 0.0–3.2)
Triglycerides: 114 mg/dL (ref 0–149)
VLDL Cholesterol Cal: 21 mg/dL (ref 5–40)

## 2020-01-29 LAB — T4, FREE: Free T4: 1.24 ng/dL (ref 0.82–1.77)

## 2020-01-29 LAB — TSH: TSH: 0.64 u[IU]/mL (ref 0.450–4.500)

## 2020-02-02 ENCOUNTER — Encounter: Payer: BC Managed Care – PPO | Admitting: Nurse Practitioner

## 2020-02-08 DIAGNOSIS — R300 Dysuria: Secondary | ICD-10-CM | POA: Insufficient documentation

## 2020-02-08 DIAGNOSIS — L409 Psoriasis, unspecified: Secondary | ICD-10-CM | POA: Insufficient documentation

## 2020-02-08 DIAGNOSIS — Z0001 Encounter for general adult medical examination with abnormal findings: Secondary | ICD-10-CM | POA: Insufficient documentation

## 2020-02-12 ENCOUNTER — Other Ambulatory Visit: Payer: Self-pay | Admitting: Nurse Practitioner

## 2020-02-12 ENCOUNTER — Telehealth: Payer: Self-pay | Admitting: Nurse Practitioner

## 2020-02-12 DIAGNOSIS — N390 Urinary tract infection, site not specified: Secondary | ICD-10-CM

## 2020-02-12 DIAGNOSIS — R300 Dysuria: Secondary | ICD-10-CM

## 2020-02-12 MED ORDER — NITROFURANTOIN MONOHYD MACRO 100 MG PO CAPS: 100.0000 mg | ORAL_CAPSULE | Freq: Two times a day (BID) | ORAL | 0 refills | Status: DC

## 2020-02-12 MED ORDER — PHENAZOPYRIDINE HCL 200 MG PO TABS: 200.0000 mg | ORAL_TABLET | Freq: Three times a day (TID) | ORAL | 0 refills | Status: DC | PRN

## 2020-02-12 NOTE — Telephone Encounter (Signed)
Sent macrobid 100mg  twice daily for 7 days plus pyridium which may be taken up to three times daily as needed for bladder pain to her pharmacy. If symptoms persist after weekend, she should be seen.

## 2020-02-12 NOTE — Progress Notes (Signed)
Sent macrobid 100mg  twice daily for 7 days plus pyridium which may be taken up to three times daily as needed for bladder pain to her pharmacy. If symptoms persist after weekend, she should be seen.

## 2020-02-13 ENCOUNTER — Other Ambulatory Visit: Payer: Self-pay

## 2020-02-13 MED ORDER — NYSTATIN 100000 UNIT/ML MT SUSP: 5.0000 mL | Freq: Four times a day (QID) | OROMUCOSAL | 0 refills | Status: DC

## 2020-02-24 ENCOUNTER — Other Ambulatory Visit (INDEPENDENT_AMBULATORY_CARE_PROVIDER_SITE_OTHER): Payer: Self-pay | Admitting: Vascular Surgery

## 2020-02-24 DIAGNOSIS — I83813 Varicose veins of bilateral lower extremities with pain: Secondary | ICD-10-CM

## 2020-02-26 ENCOUNTER — Ambulatory Visit (INDEPENDENT_AMBULATORY_CARE_PROVIDER_SITE_OTHER): Payer: BC Managed Care – PPO | Admitting: Nurse Practitioner

## 2020-02-26 ENCOUNTER — Other Ambulatory Visit: Payer: Self-pay

## 2020-02-26 ENCOUNTER — Ambulatory Visit (INDEPENDENT_AMBULATORY_CARE_PROVIDER_SITE_OTHER): Payer: BC Managed Care – PPO

## 2020-02-26 ENCOUNTER — Encounter (INDEPENDENT_AMBULATORY_CARE_PROVIDER_SITE_OTHER): Payer: Self-pay | Admitting: Nurse Practitioner

## 2020-02-26 VITALS — BP 127/79 | HR 89 | Resp 16 | Ht 65.5 in | Wt 173.8 lb

## 2020-02-26 DIAGNOSIS — I8311 Varicose veins of right lower extremity with inflammation: Secondary | ICD-10-CM | POA: Diagnosis not present

## 2020-02-26 DIAGNOSIS — I83813 Varicose veins of bilateral lower extremities with pain: Secondary | ICD-10-CM | POA: Diagnosis not present

## 2020-02-26 DIAGNOSIS — E1165 Type 2 diabetes mellitus with hyperglycemia: Secondary | ICD-10-CM

## 2020-02-26 DIAGNOSIS — I8312 Varicose veins of left lower extremity with inflammation: Secondary | ICD-10-CM | POA: Diagnosis not present

## 2020-02-26 DIAGNOSIS — I10 Essential (primary) hypertension: Secondary | ICD-10-CM | POA: Diagnosis not present

## 2020-03-01 ENCOUNTER — Encounter (INDEPENDENT_AMBULATORY_CARE_PROVIDER_SITE_OTHER): Payer: Self-pay | Admitting: Nurse Practitioner

## 2020-03-01 ENCOUNTER — Other Ambulatory Visit: Payer: Self-pay

## 2020-03-01 MED ORDER — MELOXICAM 7.5 MG PO TABS: 7.5000 mg | ORAL_TABLET | Freq: Two times a day (BID) | ORAL | 5 refills | Status: DC

## 2020-03-01 NOTE — Progress Notes (Signed)
Subjective:    Patient ID: Belinda Soto, female    DOB: 1956-01-28, 64 y.o.   MRN: [id number] Chief Complaint  Patient presents with  . New Patient (Initial Visit)    ref [place] varicose veins    The patient is seen for evaluation of symptomatic varicose veins. The patient relates burning and stinging which worsened steadily throughout the course of the day, particularly with standing. The patient also notes an aching and throbbing pain over the varicosities, particularly with prolonged dependent positions. The symptoms are significantly improved with elevation.  The patient also notes that during hot weather the symptoms are greatly intensified. The patient states the pain from the varicose veins interferes with work, daily exercise, shopping and household maintenance. At this point, the symptoms are persistent and severe enough that they're having a negative impact on lifestyle and are interfering with daily activities.  There is no history of DVT, PE or superficial thrombophlebitis. There is no history of ulceration or hemorrhage. The patient denies a significant family history of varicose veins.   The patient has not worn graduated compression in the past. At the present time the patient has not been using over-the-counter analgesics. There is no history of prior surgical intervention or sclerotherapy.  Today the patient underwent a bilateral lower extremity venous reflux study which reveals no evidence of DVT or superficial venous thrombosis bilaterally.  The right lower extremity has evidence of superficial venous reflux in the great saphenous vein at the proximal thigh and distal thigh.  No evidence of deep venous insufficiency in the right lower extremity.  Left lower extremity has evidence of deep venous insufficiency in the femoral vein.  The left lower extremity also has evidence of superficial venous reflux in the great saphenous vein at the mid thigh extending to the distal thigh.   Vein diameters in the right lower extremity measure from 0.40 cm to 0.47.  The left lower extremity vein diameters measure 0.53 cm.   Review of Systems  Cardiovascular: Positive for leg swelling.  Hematological: Bruises/bleeds easily.  All other systems reviewed and are negative.      Objective:   Physical Exam Vitals reviewed.  HENT:     Head: Normocephalic.  Cardiovascular:     Rate and Rhythm: Normal rate and regular rhythm.     Pulses: Normal pulses.     Heart sounds: Normal heart sounds.     Comments: Large varicosity in the left lower extremity Pulmonary:     Effort: Pulmonary effort is normal.     Breath sounds: Normal breath sounds.  Neurological:     Mental Status: She is alert and oriented to person, place, and time.  Psychiatric:        Mood and Affect: Mood normal.        Behavior: Behavior normal.        Thought Content: Thought content normal.        Judgment: Judgment normal.     BP 127/79 (BP Location: Right Arm)   Pulse 89   Resp 16   Ht 5' 5.5" (1.664 m)   Wt 173 lb 12.8 oz (78.8 kg)   BMI 28.48 kg/m   Past Medical History:  Diagnosis Date  . Arthritis   . Depression   . Diabetes mellitus without complication ([place])   . Dysrhythmia    palpitations  . GERD (gastroesophageal reflux disease)    TUMS PRN  . Headache    H/O MIGRAINES  . History of kidney  stones   . Hypertension   . Palpitations     Social History   Socioeconomic History  . Marital status: Married    Spouse name: Not on file  . Number of children: Not on file  . Years of education: Not on file  . Highest education level: Not on file  Occupational History  . Not on file  Tobacco Use  . Smoking status: Former Smoker    Packs/day: 0.50    Years: 12.00    Pack years: 6.00    Types: Cigarettes    Quit date: 08/20/1985    Years since quitting: 34.5  . Smokeless tobacco: Never Used  Substance and Sexual Activity  . Alcohol use: Yes    Comment: rarely  . Drug use: No    . Sexual activity: Not on file  Other Topics Concern  . Not on file  Social History Narrative  . Not on file   Social Determinants of Health   Financial Resource Strain:   . Difficulty of Paying Living Expenses:   Food Insecurity:   . Worried About Charity fundraiser in the Last Year:   . Arboriculturist in the Last Year:   Transportation Needs:   . Film/video editor (Medical):   Marland Kitchen Lack of Transportation (Non-Medical):   Physical Activity:   . Days of Exercise per Week:   . Minutes of Exercise per Session:   Stress:   . Feeling of Stress :   Social Connections:   . Frequency of Communication with Friends and Family:   . Frequency of Social Gatherings with Friends and Family:   . Attends Religious Services:   . Active Member of Clubs or Organizations:   . Attends Archivist Meetings:   Marland Kitchen Marital Status:   Intimate Partner Violence:   . Fear of Current or Ex-Partner:   . Emotionally Abused:   Marland Kitchen Physically Abused:   . Sexually Abused:     Past Surgical History:  Procedure Laterality Date  . c sections    . COLONOSCOPY N/A 03/08/2015   Procedure: COLONOSCOPY;  Surgeon: Hulen Luster, MD;  Location[facility] ENDOSCOPY;  Service: Gastroenterology;  Laterality: N/A;  . COLONOSCOPY  02/2015  . FISSURECTOMY N/A 08/24/2017   Procedure: FISSURECTOMY;  Surgeon: Christene Lye, MD;  Location: [facility] ORS;  Service: General;  Laterality: N/A;  . FOOT SURGERY Left 2005  . SPHINCTEROTOMY N/A 08/24/2017   Procedure: SPHINCTEROTOMY;  Surgeon: Christene Lye, MD;  Location: [facility] ORS;  Service: General;  Laterality: N/A;    Family History  Problem Relation Age of Onset  . Diabetes Mother   . Hypertension Mother   . Glaucoma Maternal Grandmother   . Alzheimer's disease Maternal Grandmother   . Breast cancer Neg Hx     Allergies  Allergen Reactions  . Sulfa Antibiotics Swelling    Facial swelling Lip swells.  . Codeine Nausea And Vomiting  . Morphine And  Related Itching       Assessment & Plan:   1. Varicose veins of both lower extremities with inflammation  Recommend:  The patient has large symptomatic varicose veins that are painful and associated with swelling.  I have had a long discussion with the patient regarding  varicose veins and why they cause symptoms.  Patient will begin wearing graduated compression stockings class 1 on a daily basis, beginning first thing in the morning and removing them in the evening. The patient is instructed specifically not  to sleep in the stockings.    The patient  will also begin using over-the-counter analgesics such as Motrin 600 mg po TID to help control the symptoms.    In addition, behavioral modification including elevation during the day will be initiated.    Pending the results of these changes the  patient will be reevaluated in three months.   An  ultrasound of the venous system will be obtained.   Further plans will be based on the ultrasound results and whether conservative therapies are successful at eliminating the pain and swelling.   2. Essential hypertension Continue antihypertensive medications as already ordered, these medications have been reviewed and there are no changes at this time.   3. Type 2 diabetes mellitus with hyperglycemia, without long-term current use of insulin (HCC) Continue hypoglycemic medications as already ordered, these medications have been reviewed and there are no changes at this time.  Hgb A1C to be monitored as already arranged by primary service    Current Outpatient Medications on File Prior to Visit  Medication Sig Dispense Refill  . amitriptyline (ELAVIL) 10 MG tablet Take 1 tablet (10 mg total) by mouth at bedtime. 30 tablet 3  . amLODipine (NORVASC) 2.5 MG tablet Take 1 tablet (2.5 mg total) by mouth daily. 90 tablet 1  . aspirin 81 MG tablet Take 81 mg by mouth daily.    . citalopram (CELEXA) 20 MG tablet TAKE 1 TABLET BY MOUTH ONCE DAILY  AROUND  7-8PM  FOR  DEPRESSION 90 tablet 0  . gabapentin (NEURONTIN) 600 MG tablet Take 2 tablets po TID 180 tablet 3  . glimepiride (AMARYL) 2 MG tablet Take 1 tablet (2 mg total) by mouth 2 (two) times daily. 60 tablet 3  . hydrochlorothiazide (HYDRODIURIL) 25 MG tablet TAKE 1 TABLET BY MOUTH ONCE DAILY IN THE MORNING 90 tablet 1  . hydrocortisone 2.5 % ointment Apply topically.    . insulin detemir (LEVEMIR FLEXTOUCH) 100 UNIT/ML FlexPen Inject 32 Units into the skin at bedtime. 15 pen 1  . Insulin Pen Needle (NOVOFINE) 32G X 6 MM MISC USE WITH VICTOZA ONCE DAILY 100 each 3  . liraglutide (VICTOZA) 18 MG/3ML SOPN Inject 1.8 mg subcutaneously once daily 9 pen 3  . losartan (COZAAR) 100 MG tablet Take 1 tablet (100 mg total) by mouth every morning. 90 tablet 3  . metFORMIN (GLUCOPHAGE) 500 MG tablet Take 2 tablets (1,000 mg total) by mouth 2 (two) times daily. 360 tablet 0  . metoprolol tartrate (LOPRESSOR) 100 MG tablet Take 1 tablet (100 mg total) by mouth 2 (two) times daily. 60 tablet 6  . nystatin (MYCOSTATIN) 100000 UNIT/ML suspension Take 5 mLs (500,000 Units total) by mouth 4 (four) times daily. 60 mL 0  . phenazopyridine (PYRIDIUM) 200 MG tablet Take 1 tablet (200 mg total) by mouth 3 (three) times daily as needed for pain. 10 tablet 0  . VITAMIN D PO Take by mouth.    . calcipotriene-betamethasone (TACLONEX) ointment Apply topically daily. (Patient not taking: Reported on 02/26/2020) 60 g 2  . calcium carbonate (TUMS - DOSED IN MG ELEMENTAL CALCIUM) 500 MG chewable tablet Chew 1 tablet by mouth as needed for indigestion or heartburn.    . fluticasone (FLONASE) 50 MCG/ACT nasal spray Place 2 sprays into both nostrils daily. (Patient not taking: Reported on 02/26/2020) 16 g 6  . nitrofurantoin, macrocrystal-monohydrate, (MACROBID) 100 MG capsule Take 1 capsule (100 mg total) by mouth 2 (two) times daily. (Patient not taking: Reported  on 02/26/2020) 14 capsule 0  . omeprazole (PRILOSEC) 20 MG  capsule Take 1 capsule (20 mg total) by mouth daily. (Patient not taking: Reported on 02/26/2020) 30 capsule 3   No current facility-administered medications on file prior to visit.    There are no Patient Instructions on file for this visit. No follow-ups on file.   Kris Hartmann, NP

## 2020-03-08 ENCOUNTER — Other Ambulatory Visit: Payer: Self-pay

## 2020-03-08 MED ORDER — METFORMIN HCL 500 MG PO TABS: 1000.0000 mg | ORAL_TABLET | Freq: Two times a day (BID) | ORAL | 0 refills | Status: DC

## 2020-03-22 ENCOUNTER — Other Ambulatory Visit: Payer: Self-pay

## 2020-03-22 MED ORDER — AMITRIPTYLINE HCL 10 MG PO TABS: 10.0000 mg | ORAL_TABLET | Freq: Every day | ORAL | 3 refills | Status: DC

## 2020-03-22 MED ORDER — CITALOPRAM HYDROBROMIDE 20 MG PO TABS: ORAL_TABLET | ORAL | 0 refills | Status: DC

## 2020-03-30 ENCOUNTER — Other Ambulatory Visit: Payer: Self-pay

## 2020-03-30 MED ORDER — LOSARTAN POTASSIUM 100 MG PO TABS: 100.0000 mg | ORAL_TABLET | ORAL | 3 refills | Status: DC

## 2020-04-19 ENCOUNTER — Other Ambulatory Visit: Payer: Self-pay | Admitting: Internal Medicine

## 2020-04-26 ENCOUNTER — Other Ambulatory Visit: Payer: Self-pay

## 2020-04-26 DIAGNOSIS — K219 Gastro-esophageal reflux disease without esophagitis: Secondary | ICD-10-CM

## 2020-04-26 MED ORDER — OMEPRAZOLE 20 MG PO CPDR: 20.0000 mg | DELAYED_RELEASE_CAPSULE | Freq: Every day | ORAL | 3 refills | Status: DC

## 2020-04-26 MED ORDER — GLIMEPIRIDE 2 MG PO TABS: 2.0000 mg | ORAL_TABLET | Freq: Two times a day (BID) | ORAL | 3 refills | Status: DC

## 2020-05-03 ENCOUNTER — Other Ambulatory Visit: Payer: Self-pay

## 2020-05-03 MED ORDER — METFORMIN HCL 500 MG PO TABS: 1000.0000 mg | ORAL_TABLET | Freq: Two times a day (BID) | ORAL | 0 refills | Status: DC

## 2020-05-04 ENCOUNTER — Other Ambulatory Visit: Payer: Self-pay

## 2020-05-04 MED ORDER — PANTOPRAZOLE SODIUM 40 MG PO TBEC: 40.0000 mg | DELAYED_RELEASE_TABLET | Freq: Every day | ORAL | 1 refills | Status: DC

## 2020-05-04 NOTE — Telephone Encounter (Signed)
Pt called omeprazole is not covered as per heather send pantoprazole and also try to call pt that we change med her voicemail full

## 2020-05-05 ENCOUNTER — Other Ambulatory Visit: Payer: Self-pay

## 2020-05-05 MED ORDER — METFORMIN HCL 500 MG PO TABS: 1000.0000 mg | ORAL_TABLET | Freq: Two times a day (BID) | ORAL | 0 refills | Status: DC

## 2020-05-18 ENCOUNTER — Telehealth: Payer: Self-pay

## 2020-05-18 NOTE — Telephone Encounter (Signed)
Confirmed patient appointment for 05/20/20

## 2020-05-20 ENCOUNTER — Other Ambulatory Visit: Payer: Self-pay

## 2020-05-20 ENCOUNTER — Encounter: Payer: Self-pay | Admitting: Nurse Practitioner

## 2020-05-20 ENCOUNTER — Ambulatory Visit: Payer: BC Managed Care – PPO | Admitting: Nurse Practitioner

## 2020-05-20 VITALS — BP 116/75 | HR 86 | Temp 97.6°F | Resp 16 | Ht 65.5 in | Wt 172.0 lb

## 2020-05-20 DIAGNOSIS — I10 Essential (primary) hypertension: Secondary | ICD-10-CM

## 2020-05-20 DIAGNOSIS — I8312 Varicose veins of left lower extremity with inflammation: Secondary | ICD-10-CM

## 2020-05-20 DIAGNOSIS — K219 Gastro-esophageal reflux disease without esophagitis: Secondary | ICD-10-CM

## 2020-05-20 DIAGNOSIS — E1140 Type 2 diabetes mellitus with diabetic neuropathy, unspecified: Secondary | ICD-10-CM

## 2020-05-20 DIAGNOSIS — I8311 Varicose veins of right lower extremity with inflammation: Secondary | ICD-10-CM | POA: Diagnosis not present

## 2020-05-20 DIAGNOSIS — Z794 Long term (current) use of insulin: Secondary | ICD-10-CM | POA: Diagnosis not present

## 2020-05-20 LAB — POCT GLYCOSYLATED HEMOGLOBIN (HGB A1C): Hemoglobin A1C: 7.4 % — AB (ref 4.0–5.6)

## 2020-05-20 NOTE — Progress Notes (Signed)
[facility] [place], [place] [address]  Internal MEDICINE  Office Visit Note  Patient Name: Belinda Soto  [id number]  [id number]  Date of Service: 06/02/2020  Chief Complaint  Patient presents with  . Follow-up    The patient is here for follow up visit. She states that she is doing well. Her blood sugars are elevated some today. Her HgbA1c is 7.4 today, up from 6.7 at her most recent visit. She states that she has not been making the best diet choices and has not been as physically active as usual. Her blood pressure is well managed. She has no concerns or complaints today.       Current Medication: Outpatient Encounter Medications as of 05/20/2020  Medication Sig  . amitriptyline (ELAVIL) 10 MG tablet Take 1 tablet (10 mg total) by mouth at bedtime.  Marland Kitchen amLODipine (NORVASC) 2.5 MG tablet Take 1 tablet (2.5 mg total) by mouth daily.  Marland Kitchen aspirin 81 MG tablet Take 81 mg by mouth daily.  . calcium carbonate (TUMS - DOSED IN MG ELEMENTAL CALCIUM) 500 MG chewable tablet Chew 1 tablet by mouth as needed for indigestion or heartburn.  . citalopram (CELEXA) 20 MG tablet TAKE 1 TABLET BY MOUTH ONCE DAILY AROUND  7-8PM  FOR  DEPRESSION  . glimepiride (AMARYL) 2 MG tablet Take 1 tablet (2 mg total) by mouth 2 (two) times daily.  . hydrochlorothiazide (HYDRODIURIL) 25 MG tablet TAKE 1 TABLET BY MOUTH ONCE DAILY IN THE MORNING  . hydrocortisone 2.5 % ointment Apply topically.  . insulin detemir (LEVEMIR FLEXTOUCH) 100 UNIT/ML FlexPen Inject 32 Units into the skin at bedtime.  . Insulin Pen Needle (NOVOFINE) 32G X 6 MM MISC USE WITH VICTOZA ONCE DAILY  . losartan (COZAAR) 100 MG tablet Take 1 tablet (100 mg total) by mouth every morning.  . meloxicam (MOBIC) 7.5 MG tablet Take 1 tablet (7.5 mg total) by mouth 2 (two) times daily.  . metFORMIN (GLUCOPHAGE) 500 MG tablet Take 2 tablets (1,000 mg total) by mouth 2 (two) times daily.  Marland Kitchen nystatin (MYCOSTATIN) 100000 UNIT/ML  suspension Take 5 mLs (500,000 Units total) by mouth 4 (four) times daily.  . pantoprazole (PROTONIX) 40 MG tablet Take 1 tablet (40 mg total) by mouth daily.  Marland Kitchen VITAMIN D PO Take by mouth.  . [DISCONTINUED] gabapentin (NEURONTIN) 600 MG tablet Take 2 tablets po TID  . [DISCONTINUED] liraglutide (VICTOZA) 18 MG/3ML SOPN Inject 1.8 mg subcutaneously once daily  . [DISCONTINUED] metoprolol tartrate (LOPRESSOR) 100 MG tablet Take 1 tablet by mouth twice daily  . [DISCONTINUED] nitrofurantoin, macrocrystal-monohydrate, (MACROBID) 100 MG capsule Take 1 capsule (100 mg total) by mouth 2 (two) times daily.  . [DISCONTINUED] phenazopyridine (PYRIDIUM) 200 MG tablet Take 1 tablet (200 mg total) by mouth 3 (three) times daily as needed for pain.  . [DISCONTINUED] calcipotriene-betamethasone (TACLONEX) ointment Apply topically daily. (Patient not taking: Reported on 02/26/2020)  . [DISCONTINUED] fluticasone (FLONASE) 50 MCG/ACT nasal spray Place 2 sprays into both nostrils daily. (Patient not taking: Reported on 02/26/2020)   No facility-administered encounter medications on file as of 05/20/2020.    Surgical History: Past Surgical History:  Procedure Laterality Date  . c sections    . COLONOSCOPY N/A 03/08/2015   Procedure: COLONOSCOPY;  Surgeon: Hulen Luster, MD;  Location: [facility] ENDOSCOPY;  Service: Gastroenterology;  Laterality: N/A;  . COLONOSCOPY  02/2015  . FISSURECTOMY N/A 08/24/2017   Procedure: FISSURECTOMY;  Surgeon: Christene Lye, MD;  Location:  [facility] ORS;  Service: General;  Laterality: N/A;  . FOOT SURGERY Left 2005  . SPHINCTEROTOMY N/A 08/24/2017   Procedure: SPHINCTEROTOMY;  Surgeon: Christene Lye, MD;  Location: [facility] ORS;  Service: General;  Laterality: N/A;    Medical History: Past Medical History:  Diagnosis Date  . Arthritis   . Depression   . Diabetes mellitus without complication ([place])   . Dysrhythmia    palpitations  . GERD (gastroesophageal reflux disease)     TUMS PRN  . Headache    H/O MIGRAINES  . History of kidney stones   . Hypertension   . Palpitations     Family History: Family History  Problem Relation Age of Onset  . Diabetes Mother   . Hypertension Mother   . Glaucoma Maternal Grandmother   . Alzheimer's disease Maternal Grandmother   . Breast cancer Neg Hx     Social History   Socioeconomic History  . Marital status: Married    Spouse name: Not on file  . Number of children: Not on file  . Years of education: Not on file  . Highest education level: Not on file  Occupational History  . Not on file  Tobacco Use  . Smoking status: Former Smoker    Packs/day: 0.50    Years: 12.00    Pack years: 6.00    Types: Cigarettes    Quit date: 08/20/1985    Years since quitting: 34.8  . Smokeless tobacco: Never Used  Vaping Use  . Vaping Use: Never used  Substance and Sexual Activity  . Alcohol use: Yes    Comment: rarely  . Drug use: No  . Sexual activity: Not on file  Other Topics Concern  . Not on file  Social History Narrative  . Not on file   Social Determinants of Health   Financial Resource Strain:   . Difficulty of Paying Living Expenses: Not on file  Food Insecurity:   . Worried About Charity fundraiser in the Last Year: Not on file  . Ran Out of Food in the Last Year: Not on file  Transportation Needs:   . Lack of Transportation (Medical): Not on file  . Lack of Transportation (Non-Medical): Not on file  Physical Activity:   . Days of Exercise per Week: Not on file  . Minutes of Exercise per Session: Not on file  Stress:   . Feeling of Stress : Not on file  Social Connections:   . Frequency of Communication with Friends and Family: Not on file  . Frequency of Social Gatherings with Friends and Family: Not on file  . Attends Religious Services: Not on file  . Active Member of Clubs or Organizations: Not on file  . Attends Archivist Meetings: Not on file  . Marital Status: Not on file   Intimate Partner Violence:   . Fear of Current or Ex-Partner: Not on file  . Emotionally Abused: Not on file  . Physically Abused: Not on file  . Sexually Abused: Not on file      Review of Systems  Constitutional: Negative for activity change, chills, fatigue and unexpected weight change.  HENT: Negative for congestion, postnasal drip, rhinorrhea, sneezing and sore throat.   Respiratory: Negative for cough, chest tightness, shortness of breath and wheezing.   Cardiovascular: Negative for chest pain and palpitations.       Varicose veins. Patient now seeing vein and vascular for further management.   Gastrointestinal: Negative for abdominal pain,  constipation, diarrhea, nausea and vomiting.  Endocrine: Negative for cold intolerance, heat intolerance, polydipsia and polyuria.       Blood sugars more elevated past few months.   Musculoskeletal: Negative for arthralgias, back pain, joint swelling and neck pain.  Skin: Negative for rash.  Allergic/Immunologic: Negative for environmental allergies, food allergies and immunocompromised state.  Neurological: Positive for numbness. Negative for dizziness and tremors.       Burning sensation in both feet.   Hematological: Negative for adenopathy. Does not bruise/bleed easily.  Psychiatric/Behavioral: Negative for behavioral problems (Depression), sleep disturbance and suicidal ideas. The patient is not nervous/anxious.     Today's Vitals   05/20/20 1614  BP: 116/75  Pulse: 86  Resp: 16  Temp: 97.6 F (36.4 C)  SpO2: 95%  Weight: 172 lb (78 kg)  Height: 5' 5.5" (1.664 m)   Body mass index is 28.19 kg/m.  Physical Exam Vitals and nursing note reviewed.  Constitutional:      General: She is not in acute distress.    Appearance: Normal appearance. She is well-developed. She is not diaphoretic.  HENT:     Head: Normocephalic and atraumatic.     Mouth/Throat:     Pharynx: No oropharyngeal exudate.  Eyes:     Pupils: Pupils are  equal, round, and reactive to light.  Neck:     Thyroid: No thyromegaly.     Vascular: No carotid bruit or JVD.     Trachea: No tracheal deviation.  Cardiovascular:     Rate and Rhythm: Normal rate and regular rhythm.     Heart sounds: Normal heart sounds. No murmur heard.  No friction rub. No gallop.      Comments: Moderate varicose veins of bilateral lower extremities. Inflammation present with some tenderness with palpation.  Pulmonary:     Effort: Pulmonary effort is normal. No respiratory distress.     Breath sounds: Normal breath sounds. No wheezing or rales.  Chest:     Chest wall: No tenderness.  Abdominal:     Palpations: Abdomen is soft.  Musculoskeletal:        General: Normal range of motion.     Cervical back: Normal range of motion and neck supple.  Lymphadenopathy:     Cervical: No cervical adenopathy.  Skin:    General: Skin is warm and dry.  Neurological:     General: No focal deficit present.     Mental Status: She is alert and oriented to person, place, and time.     Cranial Nerves: No cranial nerve deficit.  Psychiatric:        Mood and Affect: Mood normal.        Behavior: Behavior normal.        Thought Content: Thought content normal.        Judgment: Judgment normal.    Assessment/Plan: 1. Type 2 diabetes mellitus with diabetic neuropathy, with long-term current use of insulin (HCC) - POCT HgB A1C 7.4 today. No changes in medication. Advised she follow diabetic diet more strictly and increase routine physical activity.   2. Essential hypertension Stable. Continue bp medication as prescribed   3. Varicose veins of both lower extremities with inflammation Use compression socks while on feet. Follow up with vein and vascular as scheduled.   4. Gastroesophageal reflux disease without esophagitis Continue pantoprazole as prescribed   General Counseling: riannah stagner understanding of the findings of todays visit and agrees with plan of treatment.  I have discussed any further diagnostic  evaluation that may be needed or ordered today. We also reviewed her medications today. she has been encouraged to call the office with any questions or concerns that should arise related to todays visit.  Diabetes Counseling:  1. Addition of ACE inh/ ARB'S for nephroprotection. Microalbumin is updated  2. Diabetic foot care, prevention of complications. Podiatry consult 3. Exercise and lose weight.  4. Diabetic eye examination, Diabetic eye exam is updated  5. Monitor blood sugar closlely. nutrition counseling.  6. Sign and symptoms of hypoglycemia including shaking sweating,confusion and headaches.  This patient was seen by Leretha Pol FNP Collaboration with Dr Lavera Guise as a part of collaborative care agreement  Orders Placed This Encounter  Procedures  . POCT HgB A1C    Total time spent: 30 Minutes   Time spent includes review of chart, medications, test results, and follow up plan with the patient.      Dr Lavera Guise Internal medicine

## 2020-05-24 ENCOUNTER — Other Ambulatory Visit: Payer: Self-pay

## 2020-05-24 MED ORDER — METOPROLOL TARTRATE 100 MG PO TABS: 100.0000 mg | ORAL_TABLET | Freq: Two times a day (BID) | ORAL | 1 refills | Status: DC

## 2020-05-27 ENCOUNTER — Ambulatory Visit: Payer: BC Managed Care – PPO | Admitting: Nurse Practitioner

## 2020-05-28 ENCOUNTER — Other Ambulatory Visit: Payer: Self-pay

## 2020-05-28 MED ORDER — VICTOZA 18 MG/3ML ~~LOC~~ SOPN: PEN_INJECTOR | SUBCUTANEOUS | 1 refills | Status: DC

## 2020-06-01 ENCOUNTER — Other Ambulatory Visit: Payer: Self-pay

## 2020-06-01 DIAGNOSIS — E1140 Type 2 diabetes mellitus with diabetic neuropathy, unspecified: Secondary | ICD-10-CM

## 2020-06-01 MED ORDER — GABAPENTIN 600 MG PO TABS: ORAL_TABLET | ORAL | 3 refills | Status: DC

## 2020-06-03 ENCOUNTER — Ambulatory Visit (INDEPENDENT_AMBULATORY_CARE_PROVIDER_SITE_OTHER): Payer: BC Managed Care – PPO | Admitting: Vascular Surgery

## 2020-06-04 ENCOUNTER — Other Ambulatory Visit: Payer: Self-pay

## 2020-06-04 DIAGNOSIS — E1140 Type 2 diabetes mellitus with diabetic neuropathy, unspecified: Secondary | ICD-10-CM

## 2020-06-04 MED ORDER — GABAPENTIN 600 MG PO TABS: ORAL_TABLET | ORAL | 3 refills | Status: DC

## 2020-06-10 ENCOUNTER — Encounter: Payer: Self-pay | Admitting: Hospice and Palliative Medicine

## 2020-06-10 ENCOUNTER — Other Ambulatory Visit: Payer: Self-pay

## 2020-06-10 ENCOUNTER — Ambulatory Visit: Payer: BC Managed Care – PPO | Admitting: Hospice and Palliative Medicine

## 2020-06-10 DIAGNOSIS — R300 Dysuria: Secondary | ICD-10-CM | POA: Diagnosis not present

## 2020-06-10 DIAGNOSIS — N3000 Acute cystitis without hematuria: Secondary | ICD-10-CM

## 2020-06-10 DIAGNOSIS — I10 Essential (primary) hypertension: Secondary | ICD-10-CM

## 2020-06-10 LAB — POCT URINALYSIS DIPSTICK
Bilirubin, UA: NEGATIVE
Blood, UA: NEGATIVE
Glucose, UA: POSITIVE — AB
Ketones, UA: POSITIVE
Nitrite, UA: POSITIVE
Protein, UA: POSITIVE — AB
Spec Grav, UA: >=1.03 — AB (ref 1.010–1.025)
Urobilinogen, UA: NEGATIVE U/dL — AB
pH, UA: 5 (ref 5.0–8.0)

## 2020-06-10 MED ORDER — NITROFURANTOIN MONOHYD MACRO 100 MG PO CAPS: 100.0000 mg | ORAL_CAPSULE | Freq: Two times a day (BID) | ORAL | 0 refills | Status: DC

## 2020-06-10 NOTE — Progress Notes (Signed)
[facility] [place],  [address]  Internal MEDICINE  Office Visit Note  Patient Name: Belinda Soto  [id number]  [id number]  Date of Service: 06/12/2020  Chief Complaint  Patient presents with  . Follow-up    possible UTI, frequency, urgency, cloudy urine, tingly feeling  . Depression  . Diabetes  . Hypertension  . Quality Metric Gaps     HPI Pt is here for a sick visit. Symptoms started about 1 week ago, she complains of increased frequency and urgency, at times not being able to make it to the bathroom, she also complains of burning with urination She also mentions that her urine has been more cloudy than usual and has a foul odor Denies flank pain or fevers  Current Medication:  Outpatient Encounter Medications as of 06/10/2020  Medication Sig  . amitriptyline (ELAVIL) 10 MG tablet Take 1 tablet (10 mg total) by mouth at bedtime.  Marland Kitchen amLODipine (NORVASC) 2.5 MG tablet Take 1 tablet (2.5 mg total) by mouth daily.  Marland Kitchen aspirin 81 MG tablet Take 81 mg by mouth daily.  . calcium carbonate (TUMS - DOSED IN MG ELEMENTAL CALCIUM) 500 MG chewable tablet Chew 1 tablet by mouth as needed for indigestion or heartburn.  . citalopram (CELEXA) 20 MG tablet TAKE 1 TABLET BY MOUTH ONCE DAILY AROUND  7-8PM  FOR  DEPRESSION  . gabapentin (NEURONTIN) 600 MG tablet Take 2 tablets po TID  . glimepiride (AMARYL) 2 MG tablet Take 1 tablet (2 mg total) by mouth 2 (two) times daily.  . hydrochlorothiazide (HYDRODIURIL) 25 MG tablet TAKE 1 TABLET BY MOUTH ONCE DAILY IN THE MORNING  . hydrocortisone 2.5 % ointment Apply topically.  . insulin detemir (LEVEMIR FLEXTOUCH) 100 UNIT/ML FlexPen Inject 32 Units into the skin at bedtime.  . Insulin Pen Needle (NOVOFINE) 32G X 6 MM MISC USE WITH VICTOZA ONCE DAILY  . liraglutide (VICTOZA) 18 MG/3ML SOPN Inject 1.8 mg subcutaneously once daily  . losartan (COZAAR) 100 MG tablet Take 1 tablet (100 mg total) by mouth every  morning.  . meloxicam (MOBIC) 7.5 MG tablet Take 1 tablet (7.5 mg total) by mouth 2 (two) times daily.  . metFORMIN (GLUCOPHAGE) 500 MG tablet Take 2 tablets (1,000 mg total) by mouth 2 (two) times daily.  . metoprolol tartrate (LOPRESSOR) 100 MG tablet Take 1 tablet (100 mg total) by mouth 2 (two) times daily.  Marland Kitchen nystatin (MYCOSTATIN) 100000 UNIT/ML suspension Take 5 mLs (500,000 Units total) by mouth 4 (four) times daily.  . pantoprazole (PROTONIX) 40 MG tablet Take 1 tablet (40 mg total) by mouth daily.  Marland Kitchen VITAMIN D PO Take by mouth.  . nitrofurantoin, macrocrystal-monohydrate, (MACROBID) 100 MG capsule Take 1 capsule (100 mg total) by mouth 2 (two) times daily.   No facility-administered encounter medications on file as of 06/10/2020.    Medical History: Past Medical History:  Diagnosis Date  . Arthritis   . Depression   . Diabetes mellitus without complication ([place])   . Dysrhythmia    palpitations  . GERD (gastroesophageal reflux disease)    TUMS PRN  . Headache    H/O MIGRAINES  . History of kidney stones   . Hypertension   . Palpitations      Vital Signs: BP 121/60   Pulse 89   Temp (!) 97.5 F (36.4 C)   Resp 16   Ht 5' 5.5" (1.664 m)   Wt 175 lb (79.4 kg)   SpO2 96%  BMI 28.68 kg/m    Review of Systems  Constitutional: Negative for chills, diaphoresis and fatigue.  HENT: Negative for ear pain, postnasal drip and sinus pressure.   Eyes: Negative for photophobia, discharge, redness, itching and visual disturbance.  Respiratory: Negative for cough, shortness of breath and wheezing.   Cardiovascular: Negative for chest pain, palpitations and leg swelling.  Gastrointestinal: Negative for abdominal pain, constipation, diarrhea, nausea and vomiting.  Genitourinary: Positive for dysuria and frequency. Negative for flank pain.  Musculoskeletal: Negative for arthralgias, back pain, gait problem and neck pain.  Skin: Negative for color change.  Allergic/Immunologic:  Negative for environmental allergies and food allergies.  Neurological: Negative for dizziness and headaches.  Hematological: Does not bruise/bleed easily.  Psychiatric/Behavioral: Negative for agitation, behavioral problems (depression) and hallucinations.    Physical Exam Vitals reviewed.  Constitutional:      Appearance: Normal appearance.  HENT:     Mouth/Throat:     Mouth: Mucous membranes are moist.     Pharynx: Oropharynx is clear.  Cardiovascular:     Rate and Rhythm: Normal rate and regular rhythm.     Pulses: Normal pulses.     Heart sounds: Normal heart sounds.  Pulmonary:     Effort: Pulmonary effort is normal.     Breath sounds: Normal breath sounds.  Abdominal:     General: Abdomen is flat.     Palpations: Abdomen is soft.  Musculoskeletal:        General: Normal range of motion.     Cervical back: Normal range of motion.  Skin:    General: Skin is warm.  Neurological:     General: No focal deficit present.     Mental Status: She is alert and oriented to person, place, and time. Mental status is at baseline.  Psychiatric:        Mood and Affect: Mood normal.        Behavior: Behavior normal.        Thought Content: Thought content normal.     Assessment/Plan: 1. Acute cystitis without hematuria Urine sample and symptoms consist with acute simple cystitis, will treat with 7 days of Macrobid. Will review urine culture results and adjust plan of care as indicated. - nitrofurantoin, macrocrystal-monohydrate, (MACROBID) 100 MG capsule; Take 1 capsule (100 mg total) by mouth 2 (two) times daily.  Dispense: 14 capsule; Refill: 0  2. Essential hypertension BP and HR well controlled, continue with current therapy and routine monitoring.  3. Dysuria - POCT Urinalysis Dipstick - CULTURE, URINE COMPREHENSIVE  General Counseling: Belinda Soto understanding of the findings of todays visit and agrees with plan of treatment. I have discussed any further diagnostic  evaluation that may be needed or ordered today. We also reviewed her medications today. she has been encouraged to call the office with any questions or concerns that should arise related to todays visit.   Orders Placed This Encounter  Procedures  . CULTURE, URINE COMPREHENSIVE  . POCT Urinalysis Dipstick    Meds ordered this encounter  Medications  . nitrofurantoin, macrocrystal-monohydrate, (MACROBID) 100 MG capsule    Sig: Take 1 capsule (100 mg total) by mouth 2 (two) times daily.    Dispense:  14 capsule    Refill:  0    Time spent: 25 Minutes  This patient was seen by Theodoro Grist AGNP-C in Collaboration with Dr Lavera Guise as a part of collaborative care agreement.  Tanna Furry [facility] Internal Medicine

## 2020-06-12 ENCOUNTER — Encounter: Payer: Self-pay | Admitting: Hospice and Palliative Medicine

## 2020-06-15 ENCOUNTER — Encounter: Payer: Self-pay | Admitting: Hospice and Palliative Medicine

## 2020-06-17 LAB — CULTURE, URINE COMPREHENSIVE

## 2020-06-18 ENCOUNTER — Other Ambulatory Visit: Payer: Self-pay

## 2020-06-18 MED ORDER — CITALOPRAM HYDROBROMIDE 20 MG PO TABS: ORAL_TABLET | ORAL | 1 refills | Status: DC

## 2020-07-02 ENCOUNTER — Other Ambulatory Visit: Payer: Self-pay

## 2020-07-02 MED ORDER — PANTOPRAZOLE SODIUM 40 MG PO TBEC: 40.0000 mg | DELAYED_RELEASE_TABLET | Freq: Every day | ORAL | 1 refills | Status: DC

## 2020-07-05 ENCOUNTER — Other Ambulatory Visit: Payer: Self-pay

## 2020-07-05 MED ORDER — INSULIN PEN NEEDLE 32G X 4 MM MISC: 1 refills | Status: AC

## 2020-07-05 MED ORDER — HYDROCHLOROTHIAZIDE 25 MG PO TABS: ORAL_TABLET | ORAL | 1 refills | Status: DC

## 2020-07-15 ENCOUNTER — Other Ambulatory Visit: Payer: Self-pay

## 2020-07-15 MED ORDER — AMLODIPINE BESYLATE 2.5 MG PO TABS: 2.5000 mg | ORAL_TABLET | Freq: Every day | ORAL | 1 refills | Status: DC

## 2020-07-20 ENCOUNTER — Other Ambulatory Visit: Payer: Self-pay

## 2020-07-20 MED ORDER — METOPROLOL TARTRATE 100 MG PO TABS: 100.0000 mg | ORAL_TABLET | Freq: Two times a day (BID) | ORAL | 1 refills | Status: DC

## 2020-07-27 ENCOUNTER — Other Ambulatory Visit: Payer: Self-pay

## 2020-07-27 MED ORDER — AMITRIPTYLINE HCL 10 MG PO TABS: 10.0000 mg | ORAL_TABLET | Freq: Every day | ORAL | 1 refills | Status: DC

## 2020-07-30 ENCOUNTER — Other Ambulatory Visit: Payer: Self-pay | Admitting: Internal Medicine

## 2020-07-30 ENCOUNTER — Other Ambulatory Visit: Payer: Self-pay | Admitting: Nurse Practitioner

## 2020-07-30 DIAGNOSIS — Z1231 Encounter for screening mammogram for malignant neoplasm of breast: Secondary | ICD-10-CM

## 2020-08-31 ENCOUNTER — Other Ambulatory Visit: Payer: Self-pay

## 2020-08-31 MED ORDER — MELOXICAM 7.5 MG PO TABS: 7.5000 mg | ORAL_TABLET | Freq: Two times a day (BID) | ORAL | 5 refills | Status: DC

## 2020-09-06 ENCOUNTER — Other Ambulatory Visit: Payer: Self-pay

## 2020-09-06 MED ORDER — PANTOPRAZOLE SODIUM 40 MG PO TBEC: 40.0000 mg | DELAYED_RELEASE_TABLET | Freq: Every day | ORAL | 1 refills | Status: DC

## 2020-09-07 ENCOUNTER — Other Ambulatory Visit: Payer: Self-pay

## 2020-09-07 ENCOUNTER — Ambulatory Visit
Admission: RE | Admit: 2020-09-07 | Discharge: 2020-09-07 | Disposition: A | Discharge status: Home / Self Care | Payer: BC Managed Care – PPO | Source: Ambulatory Visit | Attending: Nurse Practitioner | Admitting: Nurse Practitioner

## 2020-09-07 DIAGNOSIS — Z1231 Encounter for screening mammogram for malignant neoplasm of breast: Secondary | ICD-10-CM | POA: Diagnosis not present

## 2020-09-08 ENCOUNTER — Other Ambulatory Visit: Payer: Self-pay

## 2020-09-08 MED ORDER — METFORMIN HCL 500 MG PO TABS: 1000.0000 mg | ORAL_TABLET | Freq: Two times a day (BID) | ORAL | 0 refills | Status: DC

## 2020-09-10 NOTE — Progress Notes (Signed)
Negative mammogram

## 2020-09-20 ENCOUNTER — Ambulatory Visit: Payer: BC Managed Care – PPO | Admitting: Nurse Practitioner

## 2020-09-22 ENCOUNTER — Other Ambulatory Visit: Payer: Self-pay

## 2020-09-22 MED ORDER — METOPROLOL TARTRATE 100 MG PO TABS: 100.0000 mg | ORAL_TABLET | Freq: Two times a day (BID) | ORAL | 1 refills | Status: DC

## 2020-09-27 ENCOUNTER — Other Ambulatory Visit: Payer: Self-pay

## 2020-09-28 MED ORDER — AMITRIPTYLINE HCL 10 MG PO TABS: 10.0000 mg | ORAL_TABLET | Freq: Every day | ORAL | 1 refills | Status: DC

## 2020-09-29 ENCOUNTER — Other Ambulatory Visit: Payer: Self-pay

## 2020-09-29 DIAGNOSIS — E1140 Type 2 diabetes mellitus with diabetic neuropathy, unspecified: Secondary | ICD-10-CM

## 2020-09-29 MED ORDER — GABAPENTIN 600 MG PO TABS: ORAL_TABLET | ORAL | 0 refills | Status: DC

## 2020-10-05 ENCOUNTER — Ambulatory Visit: Payer: BC Managed Care – PPO | Admitting: Nurse Practitioner

## 2020-10-05 ENCOUNTER — Encounter: Payer: Self-pay | Admitting: Nurse Practitioner

## 2020-10-05 ENCOUNTER — Other Ambulatory Visit: Payer: Self-pay

## 2020-10-05 VITALS — BP 132/84 | HR 84 | Temp 97.5°F | Resp 16 | Ht 65.5 in | Wt 176.6 lb

## 2020-10-05 DIAGNOSIS — B370 Candidal stomatitis: Secondary | ICD-10-CM

## 2020-10-05 DIAGNOSIS — E1140 Type 2 diabetes mellitus with diabetic neuropathy, unspecified: Secondary | ICD-10-CM | POA: Diagnosis not present

## 2020-10-05 DIAGNOSIS — R438 Other disturbances of smell and taste: Secondary | ICD-10-CM

## 2020-10-05 DIAGNOSIS — Z794 Long term (current) use of insulin: Secondary | ICD-10-CM

## 2020-10-05 DIAGNOSIS — I10 Essential (primary) hypertension: Secondary | ICD-10-CM

## 2020-10-05 LAB — POCT GLYCOSYLATED HEMOGLOBIN (HGB A1C): Hemoglobin A1C: 7 % — AB (ref 4.0–5.6)

## 2020-10-05 NOTE — Progress Notes (Signed)
[facility] [place], [place] [address]  Internal MEDICINE  Office Visit Note  Patient Name: Belinda Soto  [id number]  [id number]  Date of Service: 10/16/2020  Chief Complaint  Patient presents with  . Follow-up  . Diabetes  . Depression  . Gastroesophageal Reflux  . Hypertension    The patient is here for follow up visit. -having problems with her tongue. Very red in center. Have treated her in the past for thrush, common for diabetics. This was back in 01/2020. She states it never really got any better, though she hadn't mentioned it again.  -also has reduced sense of smell. Has been going on long before onset of COVID 19. Would like to seen ENT. Specifically, she would like to see dr. Ladene Artist.       Current Medication: Outpatient Encounter Medications as of 10/05/2020  Medication Sig  . amitriptyline (ELAVIL) 10 MG tablet Take 1 tablet (10 mg total) by mouth at bedtime.  Marland Kitchen amLODipine (NORVASC) 2.5 MG tablet Take 1 tablet (2.5 mg total) by mouth daily.  Marland Kitchen aspirin 81 MG tablet Take 81 mg by mouth daily.  . calcium carbonate (TUMS - DOSED IN MG ELEMENTAL CALCIUM) 500 MG chewable tablet Chew 1 tablet by mouth as needed for indigestion or heartburn.  . citalopram (CELEXA) 20 MG tablet TAKE 1 TABLET BY MOUTH ONCE DAILY AROUND  7-8PM  FOR  DEPRESSION  . gabapentin (NEURONTIN) 600 MG tablet Take 2 tablets po TID  . glimepiride (AMARYL) 2 MG tablet Take 1 tablet (2 mg total) by mouth 2 (two) times daily.  . hydrochlorothiazide (HYDRODIURIL) 25 MG tablet TAKE 1 TABLET BY MOUTH ONCE DAILY IN THE MORNING  . hydrocortisone 2.5 % ointment Apply topically.  . insulin detemir (LEVEMIR FLEXTOUCH) 100 UNIT/ML FlexPen Inject 32 Units into the skin at bedtime.  . Insulin Pen Needle (NOVOFINE) 32G X 6 MM MISC USE WITH VICTOZA ONCE DAILY  . Insulin Pen Needle 32G X 4 MM MISC Use as directed with victoza daily  . liraglutide (VICTOZA) 18 MG/3ML SOPN Inject 1.8 mg  subcutaneously once daily  . losartan (COZAAR) 100 MG tablet Take 1 tablet (100 mg total) by mouth every morning.  . meloxicam (MOBIC) 7.5 MG tablet Take 1 tablet (7.5 mg total) by mouth 2 (two) times daily.  . metFORMIN (GLUCOPHAGE) 500 MG tablet Take 2 tablets (1,000 mg total) by mouth 2 (two) times daily.  . metoprolol tartrate (LOPRESSOR) 100 MG tablet Take 1 tablet (100 mg total) by mouth 2 (two) times daily.  . nitrofurantoin, macrocrystal-monohydrate, (MACROBID) 100 MG capsule Take 1 capsule (100 mg total) by mouth 2 (two) times daily.  Marland Kitchen nystatin (MYCOSTATIN) 100000 UNIT/ML suspension Take 5 mLs (500,000 Units total) by mouth 4 (four) times daily.  . pantoprazole (PROTONIX) 40 MG tablet Take 1 tablet (40 mg total) by mouth daily.  Marland Kitchen VITAMIN D PO Take by mouth.   No facility-administered encounter medications on file as of 10/05/2020.    Surgical History: Past Surgical History:  Procedure Laterality Date  . c sections    . COLONOSCOPY N/A 03/08/2015   Procedure: COLONOSCOPY;  Surgeon: Hulen Luster, MD;  Location: [facility] ENDOSCOPY;  Service: Gastroenterology;  Laterality: N/A;  . COLONOSCOPY  02/2015  . FISSURECTOMY N/A 08/24/2017   Procedure: FISSURECTOMY;  Surgeon: Christene Lye, MD;  Location: [facility] ORS;  Service: General;  Laterality: N/A;  . FOOT SURGERY Left 2005  . SPHINCTEROTOMY N/A 08/24/2017   Procedure: SPHINCTEROTOMY;  Surgeon: Christene Lye, MD;  Location: [facility] ORS;  Service: General;  Laterality: N/A;    Medical History: Past Medical History:  Diagnosis Date  . Arthritis   . Depression   . Diabetes mellitus without complication ([place])   . Dysrhythmia    palpitations  . GERD (gastroesophageal reflux disease)    TUMS PRN  . Headache    H/O MIGRAINES  . History of kidney stones   . Hypertension   . Palpitations     Family History: Family History  Problem Relation Age of Onset  . Diabetes Mother   . Hypertension Mother   . Glaucoma Maternal  Grandmother   . Alzheimer's disease Maternal Grandmother   . Breast cancer Neg Hx     Social History   Socioeconomic History  . Marital status: Married    Spouse name: Not on file  . Number of children: Not on file  . Years of education: Not on file  . Highest education level: Not on file  Occupational History  . Not on file  Tobacco Use  . Smoking status: Former Smoker    Packs/day: 0.50    Years: 12.00    Pack years: 6.00    Types: Cigarettes    Quit date: 08/20/1985    Years since quitting: 35.1  . Smokeless tobacco: Never Used  Vaping Use  . Vaping Use: Never used  Substance and Sexual Activity  . Alcohol use: Yes    Comment: rarely  . Drug use: No  . Sexual activity: Not on file  Other Topics Concern  . Not on file  Social History Narrative  . Not on file   Social Determinants of Health   Financial Resource Strain: Not on file  Food Insecurity: Not on file  Transportation Needs: Not on file  Physical Activity: Not on file  Stress: Not on file  Social Connections: Not on file  Intimate Partner Violence: Not on file      Review of Systems  Constitutional: Negative for activity change, chills, fatigue and unexpected weight change.  HENT: Positive for mouth sores. Negative for congestion, postnasal drip, rhinorrhea, sneezing and sore throat.        Plaques of thrush on the tongue and inside of cheeks.   Respiratory: Negative for cough, chest tightness, shortness of breath and wheezing.   Cardiovascular: Negative for chest pain and palpitations.  Gastrointestinal: Negative for abdominal pain, constipation, diarrhea, nausea and vomiting.  Endocrine: Negative for cold intolerance, heat intolerance, polydipsia and polyuria.       Blood sugars doing well   Musculoskeletal: Negative for arthralgias, back pain, joint swelling and neck pain.  Skin: Negative for rash.  Allergic/Immunologic: Negative for environmental allergies, food allergies and immunocompromised  state.  Neurological: Positive for numbness. Negative for dizziness and tremors.       Burning sensation in both feet.   Hematological: Negative for adenopathy. Does not bruise/bleed easily.  Psychiatric/Behavioral: Negative for behavioral problems (Depression), sleep disturbance and suicidal ideas. The patient is not nervous/anxious.     Today's Vitals   10/05/20 1606  BP: 132/84  Pulse: 84  Resp: 16  Temp: (!) 97.5 F (36.4 C)  SpO2: 99%  Weight: 176 lb 9.6 oz (80.1 kg)  Height: 5' 5.5" (1.664 m)   Body mass index is 28.94 kg/m.  Physical Exam Vitals and nursing note reviewed.  Constitutional:      General: She is not in acute distress.    Appearance: Normal appearance. She is  well-developed. She is not diaphoretic.  HENT:     Head: Normocephalic and atraumatic.     Mouth/Throat:     Pharynx: No oropharyngeal exudate.     Comments: Tongue has plaques of thrush on her tongue and inside cheeks.  Eyes:     Pupils: Pupils are equal, round, and reactive to light.  Neck:     Thyroid: No thyromegaly.     Vascular: No carotid bruit or JVD.     Trachea: No tracheal deviation.  Cardiovascular:     Rate and Rhythm: Normal rate and regular rhythm.     Heart sounds: Normal heart sounds. No murmur heard. No friction rub. No gallop.   Pulmonary:     Effort: Pulmonary effort is normal. No respiratory distress.     Breath sounds: Normal breath sounds. No wheezing or rales.  Chest:     Chest wall: No tenderness.  Abdominal:     Palpations: Abdomen is soft.  Musculoskeletal:        General: Normal range of motion.     Cervical back: Normal range of motion and neck supple.  Lymphadenopathy:     Cervical: No cervical adenopathy.  Skin:    General: Skin is warm and dry.  Neurological:     General: No focal deficit present.     Mental Status: She is alert and oriented to person, place, and time.     Cranial Nerves: No cranial nerve deficit.  Psychiatric:        Mood and Affect:  Mood normal.        Behavior: Behavior normal.        Thought Content: Thought content normal.        Judgment: Judgment normal.    Assessment/Plan: 1. Type 2 diabetes mellitus with diabetic neuropathy, with long-term current use of insulin (HCC) - POCT HgB A1C 7.0 today, improved from 7.4 at her last visit. Continue diabetic medication as prescribed  2. Oral candidiasis Refer to ENT for further evaluation. - Ambulatory referral to ENT  3. Reduced sense of smell Refer to ENT for further evaluation. - Ambulatory referral to ENT  4. Essential hypertension Stable. Continue bp medication as prescribed   General Counseling: marionna gonia understanding of the findings of todays visit and agrees with plan of treatment. I have discussed any further diagnostic evaluation that may be needed or ordered today. We also reviewed her medications today. she has been encouraged to call the office with any questions or concerns that should arise related to todays visit.  Hypertension Counseling:   The following hypertensive lifestyle modification were recommended and discussed:  1. Limiting alcohol intake to less than 1 oz/day of ethanol:(24 oz of beer or 8 oz of wine or 2 oz of 100-proof whiskey). 2. Take baby ASA 81 mg daily. 3. Importance of regular aerobic exercise and losing weight. 4. Reduce dietary saturated fat and cholesterol intake for overall cardiovascular health. 5. Maintaining adequate dietary potassium, calcium, and magnesium intake. 6. Regular monitoring of the blood pressure. 7. Reduce sodium intake to less than 100 mmol/day (less than 2.3 gm of sodium or less than 6 gm of sodium choride)   This patient was seen by [place] with Dr Lavera Guise as a part of collaborative care agreement  Orders Placed This Encounter  Procedures  . Ambulatory referral to ENT  . POCT HgB A1C     Total time spent: 30 Minutes   Time spent includes review of chart,  medications, test results, and follow up plan with the patient.      Dr Lavera Guise Internal medicine

## 2020-10-16 DIAGNOSIS — R438 Other disturbances of smell and taste: Secondary | ICD-10-CM | POA: Insufficient documentation

## 2020-10-29 ENCOUNTER — Other Ambulatory Visit: Payer: Self-pay | Admitting: Internal Medicine

## 2020-10-29 DIAGNOSIS — E1140 Type 2 diabetes mellitus with diabetic neuropathy, unspecified: Secondary | ICD-10-CM

## 2020-11-08 ENCOUNTER — Other Ambulatory Visit: Payer: Self-pay

## 2020-11-08 MED ORDER — PANTOPRAZOLE SODIUM 40 MG PO TBEC: 40.0000 mg | DELAYED_RELEASE_TABLET | Freq: Every day | ORAL | 1 refills | Status: DC

## 2020-11-29 ENCOUNTER — Other Ambulatory Visit: Payer: Self-pay | Admitting: Internal Medicine

## 2020-11-29 DIAGNOSIS — E1140 Type 2 diabetes mellitus with diabetic neuropathy, unspecified: Secondary | ICD-10-CM

## 2020-12-01 ENCOUNTER — Other Ambulatory Visit: Payer: Self-pay | Admitting: Internal Medicine

## 2020-12-01 ENCOUNTER — Other Ambulatory Visit: Payer: Self-pay | Admitting: Nurse Practitioner

## 2020-12-01 DIAGNOSIS — E1140 Type 2 diabetes mellitus with diabetic neuropathy, unspecified: Secondary | ICD-10-CM

## 2020-12-01 NOTE — Telephone Encounter (Signed)
Please review for refills LOV: 10/05/2020 NOV: 03/10/2021

## 2020-12-02 ENCOUNTER — Other Ambulatory Visit: Payer: Self-pay

## 2020-12-02 ENCOUNTER — Other Ambulatory Visit: Payer: Self-pay | Admitting: Nurse Practitioner

## 2020-12-02 MED ORDER — GLIMEPIRIDE 2 MG PO TABS: 2.0000 mg | ORAL_TABLET | Freq: Two times a day (BID) | ORAL | 3 refills | Status: DC

## 2020-12-10 DIAGNOSIS — R682 Dry mouth, unspecified: Secondary | ICD-10-CM | POA: Diagnosis not present

## 2020-12-10 DIAGNOSIS — J342 Deviated nasal septum: Secondary | ICD-10-CM | POA: Diagnosis not present

## 2020-12-10 DIAGNOSIS — J3489 Other specified disorders of nose and nasal sinuses: Secondary | ICD-10-CM | POA: Diagnosis not present

## 2020-12-10 DIAGNOSIS — K146 Glossodynia: Secondary | ICD-10-CM | POA: Diagnosis not present

## 2020-12-14 ENCOUNTER — Other Ambulatory Visit: Payer: Self-pay | Admitting: Internal Medicine

## 2020-12-16 ENCOUNTER — Other Ambulatory Visit: Payer: Self-pay | Admitting: Nurse Practitioner

## 2020-12-22 ENCOUNTER — Other Ambulatory Visit: Payer: Self-pay | Admitting: Nurse Practitioner

## 2020-12-26 ENCOUNTER — Other Ambulatory Visit: Payer: Self-pay | Admitting: Nurse Practitioner

## 2020-12-27 ENCOUNTER — Other Ambulatory Visit: Payer: Self-pay | Admitting: Internal Medicine

## 2020-12-27 ENCOUNTER — Other Ambulatory Visit: Payer: Self-pay | Admitting: Nurse Practitioner

## 2020-12-29 ENCOUNTER — Other Ambulatory Visit: Payer: Self-pay

## 2020-12-29 MED ORDER — CITALOPRAM HYDROBROMIDE 20 MG PO TABS: ORAL_TABLET | ORAL | 1 refills | Status: DC

## 2020-12-29 MED ORDER — VICTOZA 18 MG/3ML ~~LOC~~ SOPN: PEN_INJECTOR | SUBCUTANEOUS | 1 refills | Status: DC

## 2020-12-31 ENCOUNTER — Other Ambulatory Visit: Payer: Self-pay | Admitting: Nurse Practitioner

## 2020-12-31 ENCOUNTER — Other Ambulatory Visit: Payer: Self-pay | Admitting: Internal Medicine

## 2020-12-31 DIAGNOSIS — E1140 Type 2 diabetes mellitus with diabetic neuropathy, unspecified: Secondary | ICD-10-CM

## 2021-01-03 ENCOUNTER — Other Ambulatory Visit: Payer: Self-pay | Admitting: Nurse Practitioner

## 2021-01-04 ENCOUNTER — Other Ambulatory Visit: Payer: Self-pay

## 2021-01-04 MED ORDER — PANTOPRAZOLE SODIUM 40 MG PO TBEC: 1.0000 | DELAYED_RELEASE_TABLET | Freq: Every day | ORAL | 1 refills | Status: DC

## 2021-01-04 MED ORDER — HYDROCHLOROTHIAZIDE 25 MG PO TABS: ORAL_TABLET | ORAL | 1 refills | Status: DC

## 2021-01-04 MED ORDER — LEVEMIR FLEXTOUCH 100 UNIT/ML ~~LOC~~ SOPN: 32.0000 [IU] | PEN_INJECTOR | Freq: Every day | SUBCUTANEOUS | 3 refills | Status: DC

## 2021-01-11 ENCOUNTER — Other Ambulatory Visit: Payer: Self-pay | Admitting: Nurse Practitioner

## 2021-01-13 ENCOUNTER — Other Ambulatory Visit: Payer: Self-pay | Admitting: Nurse Practitioner

## 2021-01-17 ENCOUNTER — Other Ambulatory Visit: Payer: Self-pay

## 2021-01-17 ENCOUNTER — Other Ambulatory Visit: Payer: Self-pay | Admitting: Internal Medicine

## 2021-01-17 ENCOUNTER — Other Ambulatory Visit: Payer: Self-pay | Admitting: Nurse Practitioner

## 2021-01-17 MED ORDER — AMLODIPINE BESYLATE 2.5 MG PO TABS: 2.5000 mg | ORAL_TABLET | Freq: Every day | ORAL | 1 refills | Status: DC

## 2021-02-01 ENCOUNTER — Ambulatory Visit: Payer: BC Managed Care – PPO | Admitting: Hospice and Palliative Medicine

## 2021-02-11 DIAGNOSIS — W06XXXA Fall from bed, initial encounter: Secondary | ICD-10-CM | POA: Diagnosis not present

## 2021-02-11 DIAGNOSIS — S0993XA Unspecified injury of face, initial encounter: Secondary | ICD-10-CM | POA: Diagnosis not present

## 2021-02-11 DIAGNOSIS — S060X0A Concussion without loss of consciousness, initial encounter: Secondary | ICD-10-CM | POA: Diagnosis not present

## 2021-02-11 DIAGNOSIS — S0083XA Contusion of other part of head, initial encounter: Secondary | ICD-10-CM | POA: Diagnosis not present

## 2021-02-18 DIAGNOSIS — R350 Frequency of micturition: Secondary | ICD-10-CM | POA: Diagnosis not present

## 2021-02-18 DIAGNOSIS — N390 Urinary tract infection, site not specified: Secondary | ICD-10-CM | POA: Diagnosis not present

## 2021-02-28 ENCOUNTER — Other Ambulatory Visit: Payer: Self-pay

## 2021-02-28 MED ORDER — GLIMEPIRIDE 2 MG PO TABS: 2.0000 mg | ORAL_TABLET | Freq: Two times a day (BID) | ORAL | 3 refills | Status: DC

## 2021-03-01 ENCOUNTER — Other Ambulatory Visit: Payer: Self-pay | Admitting: Nurse Practitioner

## 2021-03-01 ENCOUNTER — Other Ambulatory Visit: Payer: Self-pay | Admitting: Internal Medicine

## 2021-03-01 DIAGNOSIS — E1140 Type 2 diabetes mellitus with diabetic neuropathy, unspecified: Secondary | ICD-10-CM

## 2021-03-08 ENCOUNTER — Other Ambulatory Visit: Payer: Self-pay | Admitting: Nurse Practitioner

## 2021-03-09 ENCOUNTER — Other Ambulatory Visit: Payer: Self-pay

## 2021-03-09 MED ORDER — MELOXICAM 7.5 MG PO TABS: 7.5000 mg | ORAL_TABLET | Freq: Two times a day (BID) | ORAL | 0 refills | Status: DC

## 2021-03-09 NOTE — Telephone Encounter (Signed)
Pt called need refill for meloxicam its pres by heather send 30 days and she had next appt coming up 03/10/21

## 2021-03-10 ENCOUNTER — Encounter: Payer: Self-pay | Admitting: Nurse Practitioner

## 2021-03-10 ENCOUNTER — Other Ambulatory Visit: Payer: Self-pay

## 2021-03-10 ENCOUNTER — Ambulatory Visit (INDEPENDENT_AMBULATORY_CARE_PROVIDER_SITE_OTHER): Payer: BC Managed Care – PPO | Admitting: Nurse Practitioner

## 2021-03-10 VITALS — BP 128/90 | HR 91 | Temp 97.7°F | Resp 16 | Ht 65.0 in | Wt 172.8 lb

## 2021-03-10 DIAGNOSIS — K439 Ventral hernia without obstruction or gangrene: Secondary | ICD-10-CM

## 2021-03-10 DIAGNOSIS — Z0001 Encounter for general adult medical examination with abnormal findings: Secondary | ICD-10-CM

## 2021-03-10 DIAGNOSIS — R300 Dysuria: Secondary | ICD-10-CM | POA: Diagnosis not present

## 2021-03-10 DIAGNOSIS — Z0189 Encounter for other specified special examinations: Secondary | ICD-10-CM

## 2021-03-10 DIAGNOSIS — I10 Essential (primary) hypertension: Secondary | ICD-10-CM | POA: Diagnosis not present

## 2021-03-10 DIAGNOSIS — Z23 Encounter for immunization: Secondary | ICD-10-CM

## 2021-03-10 DIAGNOSIS — K219 Gastro-esophageal reflux disease without esophagitis: Secondary | ICD-10-CM

## 2021-03-10 DIAGNOSIS — Z794 Long term (current) use of insulin: Secondary | ICD-10-CM | POA: Diagnosis not present

## 2021-03-10 DIAGNOSIS — E1140 Type 2 diabetes mellitus with diabetic neuropathy, unspecified: Secondary | ICD-10-CM

## 2021-03-10 DIAGNOSIS — E559 Vitamin D deficiency, unspecified: Secondary | ICD-10-CM

## 2021-03-10 LAB — POCT GLYCOSYLATED HEMOGLOBIN (HGB A1C): Hemoglobin A1C: 7.5 % — AB (ref 4.0–5.6)

## 2021-03-10 MED ORDER — ZOSTER VAC RECOMB ADJUVANTED 50 MCG/0.5ML IM SUSR: 0.5000 mL | Freq: Once | INTRAMUSCULAR | 0 refills | Status: AC

## 2021-03-10 MED ORDER — PREVNAR 20 0.5 ML IM SUSY: 0.5000 mL | PREFILLED_SYRINGE | Freq: Once | INTRAMUSCULAR | 0 refills | Status: AC

## 2021-03-10 NOTE — Progress Notes (Signed)
[facility] [place], [place] [address]  Internal MEDICINE  Office Visit Note  Patient Name: Belinda Soto  [id number]  [id number]  Date of Service: 03/20/2021  Chief Complaint  Patient presents with   Annual Exam    Hernia on abd painful if it gets bumped, eczema    Quality Metric Gaps    Pneumavax, shingrix, eye exam   Depression   Diabetes   Gastroesophageal Reflux   Hypertension    HPI Belinda Soto presents for an annual well visit and physical exam. she has a history of arthritis, hypertension, GERD, diabetes and depression. She states she has an abdominal hernia that has gotten bigger and hurts if she bumps it on anything now. She has had 2 doses of the COVID vaccine and 1 booster. She is due for the shingles vaccine as well as pneumococcal vaccine. She is interested in receiving both vaccines.  -A1c is 7.5 today. His previous A1C was 7.0 in January 2022. She takes metformin, glimpiride and levemir basal insulin.  -Her blood pressure is 128/90 today. She takes amlodipine, losartan, metoprolol and hydrochlorothiazide. -need routine labs ordered -no refills needed at this time.  -she is overdue for her eye exam.  -screening colonoscopy due in 2026 Pap w/HPV co-test due in 2025 Mammogram due in December 2022.   Current Medication: Outpatient Encounter Medications as of 03/10/2021  Medication Sig   amitriptyline (ELAVIL) 10 MG tablet TAKE 1 TABLET BY MOUTH AT BEDTIME   amLODipine (NORVASC) 2.5 MG tablet Take 1 tablet (2.5 mg total) by mouth daily.   aspirin 81 MG tablet Take 81 mg by mouth daily.   calcium carbonate (TUMS - DOSED IN MG ELEMENTAL CALCIUM) 500 MG chewable tablet Chew 1 tablet by mouth as needed for indigestion or heartburn.   citalopram (CELEXA) 20 MG tablet TAKE 1 TABLET BY MOUTH ONCE DAILY AROUND  7-8PM  FOR  DEPRESSION   gabapentin (NEURONTIN) 600 MG tablet TAKE 2 TABLETS BY MOUTH THREE TIMES DAILY   glimepiride (AMARYL) 2 MG tablet Take  1 tablet (2 mg total) by mouth 2 (two) times daily.   hydrochlorothiazide (HYDRODIURIL) 25 MG tablet TAKE 1 TABLET BY MOUTH ONCE DAILY IN THE MORNING   hydrocortisone 2.5 % ointment Apply topically.   insulin detemir (LEVEMIR FLEXTOUCH) 100 UNIT/ML FlexPen Inject 32 Units into the skin at bedtime.   Insulin Pen Needle (NOVOFINE) 32G X 6 MM MISC USE WITH VICTOZA ONCE DAILY   Insulin Pen Needle 32G X 4 MM MISC Use as directed with victoza daily   losartan (COZAAR) 100 MG tablet Take 1 tablet (100 mg total) by mouth every morning.   meloxicam (MOBIC) 7.5 MG tablet Take 1 tablet (7.5 mg total) by mouth 2 (two) times daily.   metoprolol tartrate (LOPRESSOR) 100 MG tablet Take 1 tablet by mouth twice daily   [EXPIRED] pneumococcal 20-Val Conj Vacc (PREVNAR 20) 0.5 ML SUSY Inject 0.5 mLs into the muscle once for 1 dose.   Semaglutide,0.25 or 0.5MG/DOS, 2 MG/1.5ML SOPN Inject 0.25 mg into the skin once a week.   VITAMIN D PO Take by mouth.   [EXPIRED] Zoster Vaccine Adjuvanted [facility]) injection Inject 0.5 mLs into the muscle once for 1 dose.   [DISCONTINUED] liraglutide (VICTOZA) 18 MG/3ML SOPN Inject 1.8 mg subcutaneously once daily   [DISCONTINUED] metFORMIN (GLUCOPHAGE) 500 MG tablet Take 2 tablets by mouth twice daily   [DISCONTINUED] nitrofurantoin, macrocrystal-monohydrate, (MACROBID) 100 MG capsule Take 1 capsule (100 mg total) by mouth  2 (two) times daily. (Patient not taking: Reported on 03/10/2021)   [DISCONTINUED] nystatin (MYCOSTATIN) 100000 UNIT/ML suspension Take 5 mLs (500,000 Units total) by mouth 4 (four) times daily. (Patient not taking: Reported on 03/10/2021)   [DISCONTINUED] pantoprazole (PROTONIX) 40 MG tablet Take 1 tablet (40 mg total) by mouth daily. (Patient not taking: Reported on 03/10/2021)   No facility-administered encounter medications on file as of 03/10/2021.    Surgical History: Past Surgical History:  Procedure Laterality Date   c sections     COLONOSCOPY N/A  03/08/2015   Procedure: COLONOSCOPY;  Surgeon: Hulen Luster, MD;  Location: [facility] ENDOSCOPY;  Service: Gastroenterology;  Laterality: N/A;   COLONOSCOPY  02/2015   FISSURECTOMY N/A 08/24/2017   Procedure: FISSURECTOMY;  Surgeon: Christene Lye, MD;  Location: [facility] ORS;  Service: General;  Laterality: N/A;   FOOT SURGERY Left 2005   SPHINCTEROTOMY N/A 08/24/2017   Procedure: SPHINCTEROTOMY;  Surgeon: Christene Lye, MD;  Location: [facility] ORS;  Service: General;  Laterality: N/A;    Medical History: Past Medical History:  Diagnosis Date   Arthritis    Depression    Diabetes mellitus without complication (HCC)    Dysrhythmia    palpitations   GERD (gastroesophageal reflux disease)    TUMS PRN   Headache    H/O MIGRAINES   History of kidney stones    Hypertension    Palpitations     Family History: Family History  Problem Relation Age of Onset   Diabetes Mother    Hypertension Mother    Glaucoma Maternal Grandmother    Alzheimer's disease Maternal Grandmother    Breast cancer Neg Hx     Social History   Socioeconomic History   Marital status: Married    Spouse name: Not on file   Number of children: Not on file   Years of education: Not on file   Highest education level: Not on file  Occupational History   Not on file  Tobacco Use   Smoking status: Former    Packs/day: 0.50    Years: 12.00    Pack years: 6.00    Types: Cigarettes    Quit date: 08/20/1985    Years since quitting: 35.6   Smokeless tobacco: Never  Vaping Use   Vaping Use: Never used  Substance and Sexual Activity   Alcohol use: Yes    Comment: rarely   Drug use: No   Sexual activity: Not on file  Other Topics Concern   Not on file  Social History Narrative   Not on file   Social Determinants of Health   Financial Resource Strain: Not on file  Food Insecurity: Not on file  Transportation Needs: Not on file  Physical Activity: Not on file  Stress: Not on file  Social  Connections: Not on file  Intimate Partner Violence: Not on file      Review of Systems  Constitutional:  Negative for chills, fatigue and unexpected weight change.  HENT:  Negative for congestion, rhinorrhea, sneezing and sore throat.   Eyes:  Negative for redness.  Respiratory:  Negative for cough, chest tightness and shortness of breath.   Cardiovascular:  Negative for chest pain and palpitations.  Gastrointestinal:  Negative for abdominal pain, constipation, diarrhea, nausea and vomiting.  Genitourinary:  Negative for dysuria and frequency.  Musculoskeletal:  Negative for arthralgias, back pain, joint swelling and neck pain.  Skin:  Negative for rash.  Neurological: Negative.  Negative for tremors and numbness.  Hematological:  Negative for adenopathy. Does not bruise/bleed easily.  Psychiatric/Behavioral:  Negative for behavioral problems (Depression), sleep disturbance and suicidal ideas. The patient is not nervous/anxious.    Vital Signs: BP 128/90   Pulse 91   Temp 97.7 F (36.5 C)   Resp 16   Ht _0  (1.651 m)   Wt 172 lb 12.8 oz (78.4 kg)   SpO2 98%   BMI 28.76 kg/m    Physical Exam Vitals reviewed.  Constitutional:      General: She is not in acute distress.    Appearance: She is well-developed. She is not diaphoretic.  HENT:     Head: Normocephalic and atraumatic.     Right Ear: Tympanic membrane, ear canal and external ear normal. There is no impacted cerumen.     Left Ear: Tympanic membrane, ear canal and external ear normal. There is no impacted cerumen.     Nose: Nose normal. No congestion or rhinorrhea.     Mouth/Throat:     Mouth: Mucous membranes are moist.     Pharynx: Oropharynx is clear. No oropharyngeal exudate or posterior oropharyngeal erythema.  Eyes:     Extraocular Movements: Extraocular movements intact.     Conjunctiva/sclera: Conjunctivae normal.     Pupils: Pupils are equal, round, and reactive to light.  Neck:     Thyroid: No  thyromegaly.     Vascular: No JVD.     Trachea: No tracheal deviation.  Cardiovascular:     Rate and Rhythm: Normal rate and regular rhythm.     Pulses: Normal pulses.          Dorsalis pedis pulses are 2+ on the right side and 2+ on the left side.       Posterior tibial pulses are 2+ on the right side and 2+ on the left side.     Heart sounds: Normal heart sounds. No murmur heard.   No friction rub. No gallop.  Pulmonary:     Effort: Pulmonary effort is normal. No respiratory distress.     Breath sounds: Normal breath sounds. No wheezing or rales.  Chest:     Chest wall: No tenderness.  Abdominal:     General: Bowel sounds are normal.     Palpations: Abdomen is soft.     Hernia: A hernia is present.  Musculoskeletal:        General: Normal range of motion.     Cervical back: Normal range of motion and neck supple.     Right foot: Normal range of motion. No deformity.     Left foot: Normal range of motion. No deformity.  Feet:     Right foot:     Protective Sensation: 6 sites tested.  2 sites sensed.     Skin integrity: No ulcer, blister, skin breakdown, erythema or dry skin.     Toenail Condition: Right toenails are normal.     Left foot:     Protective Sensation: 6 sites tested.  2 sites sensed.     Skin integrity: No ulcer, blister, skin breakdown, erythema or dry skin.     Toenail Condition: Left toenails are normal.  Lymphadenopathy:     Cervical: No cervical adenopathy.  Skin:    General: Skin is warm and dry.     Capillary Refill: Capillary refill takes less than 2 seconds.  Neurological:     Mental Status: She is alert and oriented to person, place, and time.     Cranial Nerves: No  cranial nerve deficit.  Psychiatric:        Mood and Affect: Mood normal.        Behavior: Behavior normal.        Thought Content: Thought content normal.        Judgment: Judgment normal.       Assessment/Plan: 1. Encounter for general adult medical examination with abnormal  findings Age-appropriate preventive screenings discussed, annual physical exam completed.   2. Encounter for routine laboratory testing Routine labs ordered, will discuss results at follow up visit.  - CBC with Differential/Platelet - CMP14+EGFR - Lipid Panel With LDL/HDL Ratio - TSH + free T4  3. Type 2 diabetes mellitus with diabetic neuropathy, with long-term current use of insulin (HCC) A1C has increased by 0.5 to 7.5. discontinue trulicity, start ozempic. Ample or voucher provided. Will follow up in 4 weeks. Also will recheck A1C in 3 months.  - POCT HgB A1C - Semaglutide,0.25 or 0.5MG/DOS, 2 MG/1.5ML SOPN; Inject 0.25 mg into the skin once a week.  Dispense: 3 mL; Refill: 1  4. Epigastric hernia Hernia measuring approximately 4cm x 3cm. It is reducible but it does become visible with coughing, straining and in certain positions. It is painful if she bumps into anything where her hernia is. She would like to get it surgically repaired. It does not appear to be incarcerated and the patient states she has not had any issue pushing it back in. CT abdomen/pelvis ordered. Once the results are available, will refer to general surgeon.  - CT Abdomen Pelvis Wo Contrast; Future  5. Essential hypertension Stable with current medications: amlodipine 2.5 mg daily, losartan 100 mg daily, metoprolol tartrate 100 mg twice daily and hydrochlorothiazide 25 mg daily. No changes. No refills needed at this time.   6. Gastroesophageal reflux disease without esophagitis Manageable without maintenance medications. Takes TUMs as needed.   7. Vitamin D deficiency Patient takes a vitamin D supplement, recheck vitamin D level. - Vitamin D (25 hydroxy)  8. Dysuria Routine urinalysis done. - UA/M w/rflx Culture, Routine  9. Encounter for vaccination Patient is due for shingles and pneumococcal vaccine, orders sent to pharmacy.  - Zoster Vaccine Adjuvanted [facility]) injection; Inject 0.5 mLs into the  muscle once for 1 dose.  Dispense: 0.5 mL; Refill: 0 - pneumococcal 20-Val Conj Vacc (PREVNAR 20) 0.5 ML SUSY; Inject 0.5 mLs into the muscle once for 1 dose.  Dispense: 0.5 mL; Refill: 0    General Counseling: Belinda Soto understanding of the findings of todays visit and agrees with plan of treatment. I have discussed any further diagnostic evaluation that may be needed or ordered today. We also reviewed her medications today. she has been encouraged to call the office with any questions or concerns that should arise related to todays visit.    Orders Placed This Encounter  Procedures   Microscopic Examination   CT Abdomen Pelvis Wo Contrast   UA/M w/rflx Culture, Routine   CBC with Differential/Platelet   CMP14+EGFR   Lipid Panel With LDL/HDL Ratio   Vitamin D (25 hydroxy)   TSH + free T4   POCT HgB A1C    Meds ordered this encounter  Medications   Zoster Vaccine Adjuvanted [facility]) injection    Sig: Inject 0.5 mLs into the muscle once for 1 dose.    Dispense:  0.5 mL    Refill:  0   pneumococcal 20-Val Conj Vacc (PREVNAR 20) 0.5 ML SUSY    Sig: Inject 0.5 mLs into the  muscle once for 1 dose.    Dispense:  0.5 mL    Refill:  0   Semaglutide,0.25 or 0.5MG/DOS, 2 MG/1.5ML SOPN    Sig: Inject 0.25 mg into the skin once a week.    Dispense:  3 mL    Refill:  1    Return in about 4 weeks (around 04/07/2021) for F/U, Review labs/test, weight loss and blood sugar, Jonia Oakey PCP.   Total time spent:30 Minutes Time spent includes review of chart, medications, test results, and follow up plan with the patient.   [place] Controlled Substance Database was reviewed by me.  This patient was seen by Jonetta Osgood, FNP-C in collaboration with Dr. Clayborn Bigness as a part of collaborative care agreement.  Hadia Minier R. Valetta Fuller, MSN, FNP-C Internal medicine

## 2021-03-11 LAB — UA/M W/RFLX CULTURE, ROUTINE
Bilirubin, UA: NEGATIVE
Ketones, UA: NEGATIVE
Leukocytes,UA: NEGATIVE
Nitrite, UA: NEGATIVE
Protein,UA: NEGATIVE
RBC, UA: NEGATIVE
Specific Gravity, UA: 1.02 (ref 1.005–1.030)
Urobilinogen, Ur: 0.2 mg/dL (ref 0.2–1.0)
pH, UA: 6 (ref 5.0–7.5)

## 2021-03-11 LAB — MICROSCOPIC EXAMINATION
Bacteria, UA: NONE SEEN
Casts: NONE SEEN /LPF
RBC, Urine: NONE SEEN /HPF (ref 0–2)
WBC, UA: NONE SEEN /HPF (ref 0–5)

## 2021-03-15 ENCOUNTER — Other Ambulatory Visit: Payer: Self-pay | Admitting: Internal Medicine

## 2021-03-20 MED ORDER — SEMAGLUTIDE(0.25 OR 0.5MG/DOS) 2 MG/1.5ML ~~LOC~~ SOPN: 0.2500 mg | PEN_INJECTOR | SUBCUTANEOUS | 1 refills | Status: DC

## 2021-03-21 ENCOUNTER — Other Ambulatory Visit: Payer: Self-pay | Admitting: Internal Medicine

## 2021-03-25 ENCOUNTER — Ambulatory Visit
Admission: RE | Admit: 2021-03-25 | Discharge: 2021-03-25 | Disposition: A | Discharge status: Home / Self Care | Payer: BC Managed Care – PPO | Source: Ambulatory Visit | Attending: Nurse Practitioner | Admitting: Nurse Practitioner

## 2021-03-25 ENCOUNTER — Other Ambulatory Visit: Payer: Self-pay

## 2021-03-25 DIAGNOSIS — K439 Ventral hernia without obstruction or gangrene: Secondary | ICD-10-CM | POA: Insufficient documentation

## 2021-03-25 DIAGNOSIS — I700 Atherosclerosis of aorta: Secondary | ICD-10-CM | POA: Diagnosis not present

## 2021-03-25 DIAGNOSIS — K429 Umbilical hernia without obstruction or gangrene: Secondary | ICD-10-CM | POA: Diagnosis not present

## 2021-03-25 DIAGNOSIS — N200 Calculus of kidney: Secondary | ICD-10-CM | POA: Diagnosis not present

## 2021-03-25 DIAGNOSIS — R600 Localized edema: Secondary | ICD-10-CM | POA: Diagnosis not present

## 2021-03-26 ENCOUNTER — Other Ambulatory Visit: Payer: Self-pay | Admitting: Nurse Practitioner

## 2021-03-26 DIAGNOSIS — R9389 Abnormal findings on diagnostic imaging of other specified body structures: Secondary | ICD-10-CM

## 2021-03-26 DIAGNOSIS — N9489 Other specified conditions associated with female genital organs and menstrual cycle: Secondary | ICD-10-CM

## 2021-03-30 ENCOUNTER — Other Ambulatory Visit: Payer: Self-pay | Admitting: Internal Medicine

## 2021-03-30 ENCOUNTER — Other Ambulatory Visit: Payer: Self-pay | Admitting: Nurse Practitioner

## 2021-03-30 ENCOUNTER — Other Ambulatory Visit: Payer: Self-pay

## 2021-03-30 MED ORDER — LOSARTAN POTASSIUM 100 MG PO TABS: 100.0000 mg | ORAL_TABLET | ORAL | 3 refills | Status: DC

## 2021-03-31 ENCOUNTER — Other Ambulatory Visit: Payer: Self-pay | Admitting: Internal Medicine

## 2021-03-31 DIAGNOSIS — E1140 Type 2 diabetes mellitus with diabetic neuropathy, unspecified: Secondary | ICD-10-CM

## 2021-04-01 DIAGNOSIS — E559 Vitamin D deficiency, unspecified: Secondary | ICD-10-CM | POA: Diagnosis not present

## 2021-04-01 DIAGNOSIS — Z0189 Encounter for other specified special examinations: Secondary | ICD-10-CM | POA: Diagnosis not present

## 2021-04-01 DIAGNOSIS — E038 Other specified hypothyroidism: Secondary | ICD-10-CM | POA: Diagnosis not present

## 2021-04-02 LAB — TSH+FREE T4
Free T4: 1.33 ng/dL (ref 0.82–1.77)
TSH: 0.671 u[IU]/mL (ref 0.450–4.500)

## 2021-04-02 LAB — CBC WITH DIFFERENTIAL/PLATELET
Basophils Absolute: 0 x10E3/uL (ref 0.0–0.2)
Basos: 1 %
EOS (ABSOLUTE): 0.3 x10E3/uL (ref 0.0–0.4)
Eos: 5 %
Hematocrit: 33.8 % — ABNORMAL LOW (ref 34.0–46.6)
Hemoglobin: 10.5 g/dL — ABNORMAL LOW (ref 11.1–15.9)
Immature Grans (Abs): 0 x10E3/uL (ref 0.0–0.1)
Immature Granulocytes: 0 %
Lymphocytes Absolute: 1.8 x10E3/uL (ref 0.7–3.1)
Lymphs: 28 %
MCH: 24.8 pg — ABNORMAL LOW (ref 26.6–33.0)
MCHC: 31.1 g/dL — ABNORMAL LOW (ref 31.5–35.7)
MCV: 80 fL (ref 79–97)
Monocytes Absolute: 0.5 x10E3/uL (ref 0.1–0.9)
Monocytes: 7 %
Neutrophils Absolute: 4 x10E3/uL (ref 1.4–7.0)
Neutrophils: 59 %
Platelets: 265 x10E3/uL (ref 150–450)
RBC: 4.24 x10E6/uL (ref 3.77–5.28)
RDW: 13.1 % (ref 11.7–15.4)
WBC: 6.6 x10E3/uL (ref 3.4–10.8)

## 2021-04-02 LAB — CMP14+EGFR
ALT: 53 IU/L — ABNORMAL HIGH (ref 0–32)
AST: 89 IU/L — ABNORMAL HIGH (ref 0–40)
Albumin/Globulin Ratio: 1.6 (ref 1.2–2.2)
Albumin: 4.5 g/dL (ref 3.8–4.8)
Alkaline Phosphatase: 110 IU/L (ref 44–121)
BUN/Creatinine Ratio: 28 (ref 12–28)
BUN: 27 mg/dL (ref 8–27)
Bilirubin Total: 0.2 mg/dL (ref 0.0–1.2)
CO2: 23 mmol/L (ref 20–29)
Calcium: 10.5 mg/dL — ABNORMAL HIGH (ref 8.7–10.3)
Chloride: 96 mmol/L (ref 96–106)
Creatinine, Ser: 0.96 mg/dL (ref 0.57–1.00)
Globulin, Total: 2.9 g/dL (ref 1.5–4.5)
Glucose: 97 mg/dL (ref 65–99)
Potassium: 4 mmol/L (ref 3.5–5.2)
Sodium: 137 mmol/L (ref 134–144)
Total Protein: 7.4 g/dL (ref 6.0–8.5)
eGFR: 67 mL/min/1.73 (ref >59)

## 2021-04-02 LAB — LIPID PANEL WITH LDL/HDL RATIO
Cholesterol, Total: 164 mg/dL (ref 100–199)
HDL: 37 mg/dL — ABNORMAL LOW (ref >39)
LDL Chol Calc (NIH): 97 mg/dL (ref 0–99)
LDL/HDL Ratio: 2.6 ratio (ref 0.0–3.2)
Triglycerides: 169 mg/dL — ABNORMAL HIGH (ref 0–149)
VLDL Cholesterol Cal: 30 mg/dL (ref 5–40)

## 2021-04-02 LAB — VITAMIN D 25 HYDROXY (VIT D DEFICIENCY, FRACTURES): Vit D, 25-Hydroxy: 39.9 ng/mL (ref 30.0–100.0)

## 2021-04-03 ENCOUNTER — Other Ambulatory Visit: Payer: Self-pay | Admitting: Internal Medicine

## 2021-04-03 DIAGNOSIS — E1140 Type 2 diabetes mellitus with diabetic neuropathy, unspecified: Secondary | ICD-10-CM

## 2021-04-06 ENCOUNTER — Other Ambulatory Visit: Payer: Self-pay

## 2021-04-06 MED ORDER — ONETOUCH ULTRA VI STRP: ORAL_STRIP | 12 refills | Status: DC

## 2021-04-19 ENCOUNTER — Ambulatory Visit: Payer: BC Managed Care – PPO | Admitting: Nurse Practitioner

## 2021-04-20 ENCOUNTER — Ambulatory Visit (INDEPENDENT_AMBULATORY_CARE_PROVIDER_SITE_OTHER): Payer: BC Managed Care – PPO

## 2021-04-20 ENCOUNTER — Other Ambulatory Visit: Payer: Self-pay

## 2021-04-20 DIAGNOSIS — D251 Intramural leiomyoma of uterus: Secondary | ICD-10-CM

## 2021-04-20 DIAGNOSIS — N9489 Other specified conditions associated with female genital organs and menstrual cycle: Secondary | ICD-10-CM

## 2021-04-20 DIAGNOSIS — R9389 Abnormal findings on diagnostic imaging of other specified body structures: Secondary | ICD-10-CM | POA: Diagnosis not present

## 2021-05-03 ENCOUNTER — Encounter: Payer: Self-pay | Admitting: Nurse Practitioner

## 2021-05-03 ENCOUNTER — Other Ambulatory Visit: Payer: Self-pay

## 2021-05-03 ENCOUNTER — Ambulatory Visit: Payer: BC Managed Care – PPO | Admitting: Nurse Practitioner

## 2021-05-03 VITALS — BP 132/80 | HR 85 | Temp 98.2°F | Resp 16 | Ht 65.0 in | Wt 173.4 lb

## 2021-05-03 DIAGNOSIS — D509 Iron deficiency anemia, unspecified: Secondary | ICD-10-CM | POA: Diagnosis not present

## 2021-05-03 DIAGNOSIS — R748 Abnormal levels of other serum enzymes: Secondary | ICD-10-CM | POA: Diagnosis not present

## 2021-05-03 NOTE — Progress Notes (Signed)
[facility] [place], [place] [address]  Internal MEDICINE  Office Visit Note  Patient Name: Belinda Soto  [id number]  [id number]  Date of Service: 05/03/2021  Chief Complaint  Patient presents with   Follow-up    Review labs, test results , weight loss and med eval    HPI Belinda Soto presents for a follow up visit to discuss her pelvic ultrasound scan results and review labs.  -pelvic ultrasound results: possible 3 x 3.3 cm intramural fibroid cyst with possible cystic degeneration. No acute abnormalities were identified and no abnormalities concerning possible neoplastic process were identified.  -labs discussed with patient- CBC shows normocytic hypochromic anemia, will check B12, folate and iron studies.  Low HDL, elevated triglycerides: discussed adding fish oil supplement 1000 mg daily.  Thyroid levels are normal, vitamin D level is low normal, calcium level is elevated and liver enzymes are elevated, will recheck as well.     Current Medication: Outpatient Encounter Medications as of 05/03/2021  Medication Sig   amitriptyline (ELAVIL) 10 MG tablet TAKE 1 TABLET BY MOUTH AT BEDTIME   amLODipine (NORVASC) 2.5 MG tablet Take 1 tablet (2.5 mg total) by mouth daily.   aspirin 81 MG tablet Take 81 mg by mouth daily.   calcium carbonate (TUMS - DOSED IN MG ELEMENTAL CALCIUM) 500 MG chewable tablet Chew 1 tablet by mouth as needed for indigestion or heartburn.   citalopram (CELEXA) 20 MG tablet TAKE 1 TABLET BY MOUTH ONCE DAILY AROUND  7-8PM  FOR  DEPRESSION   gabapentin (NEURONTIN) 600 MG tablet TAKE 2 TABLETS BY MOUTH THREE TIMES DAILY   glimepiride (AMARYL) 2 MG tablet Take 1 tablet (2 mg total) by mouth 2 (two) times daily.   glucose blood (ONETOUCH ULTRA) test strip Use as instructed   hydrochlorothiazide (HYDRODIURIL) 25 MG tablet TAKE 1 TABLET BY MOUTH ONCE DAILY IN THE MORNING   hydrocortisone 2.5 % ointment Apply topically.   insulin detemir (LEVEMIR  FLEXTOUCH) 100 UNIT/ML FlexPen Inject 32 Units into the skin at bedtime.   Insulin Pen Needle (NOVOFINE) 32G X 6 MM MISC USE WITH VICTOZA ONCE DAILY   Insulin Pen Needle 32G X 4 MM MISC Use as directed with victoza daily   losartan (COZAAR) 100 MG tablet Take 1 tablet (100 mg total) by mouth every morning.   meloxicam (MOBIC) 7.5 MG tablet Take 1 tablet by mouth twice daily   metFORMIN (GLUCOPHAGE) 500 MG tablet Take 2 tablets by mouth twice daily   metoprolol tartrate (LOPRESSOR) 100 MG tablet Take 1 tablet by mouth twice daily   Semaglutide,0.25 or 0.5MG/DOS, 2 MG/1.5ML SOPN Inject 0.25 mg into the skin once a week.   VITAMIN D PO Take by mouth.   No facility-administered encounter medications on file as of 05/03/2021.    Surgical History: Past Surgical History:  Procedure Laterality Date   c sections     COLONOSCOPY N/A 03/08/2015   Procedure: COLONOSCOPY;  Surgeon: Hulen Luster, MD;  Location: [facility] ENDOSCOPY;  Service: Gastroenterology;  Laterality: N/A;   COLONOSCOPY  02/2015   FISSURECTOMY N/A 08/24/2017   Procedure: FISSURECTOMY;  Surgeon: Christene Lye, MD;  Location: [facility] ORS;  Service: General;  Laterality: N/A;   FOOT SURGERY Left 2005   SPHINCTEROTOMY N/A 08/24/2017   Procedure: SPHINCTEROTOMY;  Surgeon: Christene Lye, MD;  Location: [facility] ORS;  Service: General;  Laterality: N/A;    Medical History: Past Medical History:  Diagnosis Date   Arthritis  Depression    Diabetes mellitus without complication (HCC)    Dysrhythmia    palpitations   GERD (gastroesophageal reflux disease)    TUMS PRN   Headache    H/O MIGRAINES   History of kidney stones    Hypertension    Palpitations     Family History: Family History  Problem Relation Age of Onset   Diabetes Mother    Hypertension Mother    Glaucoma Maternal Grandmother    Alzheimer's disease Maternal Grandmother    Breast cancer Neg Hx     Social History   Socioeconomic History   Marital  status: Married    Spouse name: Not on file   Number of children: Not on file   Years of education: Not on file   Highest education level: Not on file  Occupational History   Not on file  Tobacco Use   Smoking status: Former    Packs/day: 0.50    Years: 12.00    Pack years: 6.00    Types: Cigarettes    Quit date: 08/20/1985    Years since quitting: 35.7   Smokeless tobacco: Never  Vaping Use   Vaping Use: Never used  Substance and Sexual Activity   Alcohol use: Yes    Comment: rarely   Drug use: No   Sexual activity: Not on file  Other Topics Concern   Not on file  Social History Narrative   Not on file   Social Determinants of Health   Financial Resource Strain: Not on file  Food Insecurity: Not on file  Transportation Needs: Not on file  Physical Activity: Not on file  Stress: Not on file  Social Connections: Not on file  Intimate Partner Violence: Not on file      Review of Systems  Constitutional:  Positive for fatigue. Negative for chills and unexpected weight change.  HENT:  Negative for congestion, rhinorrhea, sneezing and sore throat.   Eyes:  Negative for redness.  Respiratory:  Negative for cough, chest tightness and shortness of breath.   Cardiovascular:  Negative for chest pain and palpitations.  Gastrointestinal:  Negative for abdominal pain, constipation, diarrhea, nausea and vomiting.  Genitourinary:  Negative for dysuria and frequency.  Musculoskeletal:  Negative for arthralgias, back pain, joint swelling and neck pain.  Skin:  Negative for rash.  Neurological: Negative.  Negative for tremors and numbness.  Hematological:  Negative for adenopathy. Does not bruise/bleed easily.  Psychiatric/Behavioral:  Negative for behavioral problems (Depression), sleep disturbance and suicidal ideas. The patient is not nervous/anxious.    Vital Signs: BP 132/80   Pulse 85   Temp 98.2 F (36.8 C)   Resp 16   Ht 5' 5"  (1.651 m)   Wt 173 lb 6.4 oz (78.7 kg)    SpO2 96%   BMI 28.86 kg/m    Physical Exam Vitals reviewed.  Constitutional:      Appearance: Normal appearance.  HENT:     Head: Normocephalic and atraumatic.  Cardiovascular:     Rate and Rhythm: Normal rate and regular rhythm.  Pulmonary:     Effort: Pulmonary effort is normal. No respiratory distress.  Skin:    General: Skin is warm and dry.     Capillary Refill: Capillary refill takes less than 2 seconds.  Neurological:     Mental Status: She is alert and oriented to person, place, and time.  Psychiatric:        Mood and Affect: Mood normal.  Assessment/Plan: 1. Iron deficiency anemia, unspecified iron deficiency anemia type CBC showed anemia, will check B12, folate, and iron studies - B12 and Folate Panel - Iron, TIBC and Ferritin Panel  2. Hypercalcemia Elevated calcium level, labs ordered - Calcium, ionized - CMP14+EGFR  3. Elevated liver enzymes Recheck liver enzymes, will need liver u/s to look into NASH or other causes  - CMP14+EGFR   General Counseling: Belinda Soto understanding of the findings of todays visit and agrees with plan of treatment. I have discussed any further diagnostic evaluation that may be needed or ordered today. We also reviewed her medications today. she has been encouraged to call the office with any questions or concerns that should arise related to todays visit.    Orders Placed This Encounter  Procedures   B12 and Folate Panel   Iron, TIBC and Ferritin Panel   Calcium, ionized   CMP14+EGFR   Ca+Creat+P+PTH Intact    No orders of the defined types were placed in this encounter.   Return in about 1 month (around 06/03/2021) for F/U, Recheck A1C, Belinda Soto PCP.   Total time spent:30 Minutes Time spent includes review of chart, medications, test results, and follow up plan with the patient.   [place] Controlled Substance Database was reviewed by me.  This patient was seen by Belinda Osgood, FNP-C in collaboration  with Belinda Soto as a part of collaborative care agreement.   Belinda Railsback R. Valetta Fuller, MSN, FNP-C Internal medicine

## 2021-05-17 ENCOUNTER — Other Ambulatory Visit: Payer: Self-pay | Admitting: Internal Medicine

## 2021-05-17 DIAGNOSIS — E1140 Type 2 diabetes mellitus with diabetic neuropathy, unspecified: Secondary | ICD-10-CM

## 2021-06-01 ENCOUNTER — Other Ambulatory Visit: Payer: Self-pay

## 2021-06-01 ENCOUNTER — Encounter: Payer: Self-pay | Admitting: Nurse Practitioner

## 2021-06-01 ENCOUNTER — Ambulatory Visit: Payer: BC Managed Care – PPO | Admitting: Nurse Practitioner

## 2021-06-01 VITALS — BP 118/82 | HR 84 | Temp 98.1°F | Resp 16 | Ht 65.0 in | Wt 172.6 lb

## 2021-06-01 DIAGNOSIS — E1140 Type 2 diabetes mellitus with diabetic neuropathy, unspecified: Secondary | ICD-10-CM

## 2021-06-01 DIAGNOSIS — N952 Postmenopausal atrophic vaginitis: Secondary | ICD-10-CM

## 2021-06-01 DIAGNOSIS — Z794 Long term (current) use of insulin: Secondary | ICD-10-CM | POA: Diagnosis not present

## 2021-06-01 DIAGNOSIS — R300 Dysuria: Secondary | ICD-10-CM | POA: Diagnosis not present

## 2021-06-01 DIAGNOSIS — N3000 Acute cystitis without hematuria: Secondary | ICD-10-CM | POA: Diagnosis not present

## 2021-06-01 LAB — POCT URINALYSIS DIPSTICK
Bilirubin, UA: NEGATIVE
Blood, UA: NEGATIVE
Glucose, UA: NEGATIVE
Nitrite, UA: NEGATIVE
Protein, UA: POSITIVE — AB
Spec Grav, UA: 1.025 (ref 1.010–1.025)
Urobilinogen, UA: 0.2 U/dL
pH, UA: 5 (ref 5.0–8.0)

## 2021-06-01 MED ORDER — NITROFURANTOIN MONOHYD MACRO 100 MG PO CAPS: 100.0000 mg | ORAL_CAPSULE | Freq: Two times a day (BID) | ORAL | 0 refills | Status: AC

## 2021-06-01 MED ORDER — PREMARIN 0.625 MG/GM VA CREA: 1.0000 | TOPICAL_CREAM | Freq: Every day | VAGINAL | 3 refills | Status: DC

## 2021-06-01 MED ORDER — SEMAGLUTIDE(0.25 OR 0.5MG/DOS) 2 MG/1.5ML ~~LOC~~ SOPN: 0.5000 mg | PEN_INJECTOR | SUBCUTANEOUS | 1 refills | Status: DC

## 2021-06-01 NOTE — Progress Notes (Signed)
[facility] [place], [place] [address]  Internal MEDICINE  Office Visit Note  Patient Name: Belinda Soto  [id number]  [id number]  Date of Service: 06/15/2021  Chief Complaint  Patient presents with   Follow-up   Diabetes   Depression   Urinary Tract Infection    Discomfort, urgency with urination and stool, cloudy urine    HPI Belinda Soto presents for a follow-up visit for diabetes depression and possible urinary tract infection.  She reports dysuria, urinary frequency, urinary urgency, cloudy urine and suprapubic tenderness.  A urine specimen was obtained during today's visit and a UA dip was done in office which came back positive for leukocytes in the urine.  She is also postmenopausal and experiences vaginal dryness, thin skin and vaginal burning often and has been more prone to UTIs since going through menopause. Her A1c today is 7.7 this is an increase from her A1c of 7.5 in June this year.  She is also interested in switching her basal insulin to insulin glargine. Her feet have been hurting lately but she has been wearing flip-flops a lot    Current Medication: Outpatient Encounter Medications as of 06/01/2021  Medication Sig   amitriptyline (ELAVIL) 10 MG tablet TAKE 1 TABLET BY MOUTH AT BEDTIME   amLODipine (NORVASC) 2.5 MG tablet Take 1 tablet (2.5 mg total) by mouth daily.   aspirin 81 MG tablet Take 81 mg by mouth daily.   calcium carbonate (TUMS - DOSED IN MG ELEMENTAL CALCIUM) 500 MG chewable tablet Chew 1 tablet by mouth as needed for indigestion or heartburn.   citalopram (CELEXA) 20 MG tablet TAKE 1 TABLET BY MOUTH ONCE DAILY AROUND  7-8PM  FOR  DEPRESSION   conjugated estrogens (PREMARIN) vaginal cream Place 1 Applicatorful vaginally at bedtime. Daily x 2 weeks then decrease to twice weekly.   gabapentin (NEURONTIN) 600 MG tablet TAKE 2 TABLETS BY MOUTH THREE TIMES DAILY   glimepiride (AMARYL) 2 MG tablet Take 1 tablet (2 mg total) by mouth 2  (two) times daily.   glucose blood (ONETOUCH ULTRA) test strip Use as instructed   hydrochlorothiazide (HYDRODIURIL) 25 MG tablet TAKE 1 TABLET BY MOUTH ONCE DAILY IN THE MORNING   hydrocortisone 2.5 % ointment Apply topically.   insulin detemir (LEVEMIR FLEXTOUCH) 100 UNIT/ML FlexPen Inject 32 Units into the skin at bedtime.   Insulin Pen Needle (NOVOFINE) 32G X 6 MM MISC USE WITH VICTOZA ONCE DAILY   Insulin Pen Needle 32G X 4 MM MISC Use as directed with victoza daily   losartan (COZAAR) 100 MG tablet Take 1 tablet (100 mg total) by mouth every morning.   meloxicam (MOBIC) 7.5 MG tablet Take 1 tablet by mouth twice daily   metFORMIN (GLUCOPHAGE) 500 MG tablet Take 2 tablets by mouth twice daily   metoprolol tartrate (LOPRESSOR) 100 MG tablet Take 1 tablet by mouth twice daily   [EXPIRED] nitrofurantoin, macrocrystal-monohydrate, (MACROBID) 100 MG capsule Take 1 capsule (100 mg total) by mouth 2 (two) times daily for 10 days.   VITAMIN D PO Take by mouth.   [DISCONTINUED] Semaglutide,0.25 or 0.'5MG'$ /DOS, 2 MG/1.5ML SOPN Inject 0.25 mg into the skin once a week.   Semaglutide,0.25 or 0.'5MG'$ /DOS, 2 MG/1.5ML SOPN Inject 0.5 mg into the skin once a week.   No facility-administered encounter medications on file as of 06/01/2021.    Surgical History: Past Surgical History:  Procedure Laterality Date   c sections     COLONOSCOPY N/A 03/08/2015  Procedure: COLONOSCOPY;  Surgeon: Hulen Luster, MD;  [facility] ENDOSCOPY;  Service: Gastroenterology;  Laterality: N/A;   COLONOSCOPY  02/2015   FISSURECTOMY N/A 08/24/2017   Procedure: FISSURECTOMY;  Surgeon: Christene Lye, MD;  Location: [facility] ORS;  Service: General;  Laterality: N/A;   FOOT SURGERY Left 2005   SPHINCTEROTOMY N/A 08/24/2017   Procedure: SPHINCTEROTOMY;  Surgeon: Christene Lye, MD;  Location: [facility] ORS;  Service: General;  Laterality: N/A;    Medical History: Past Medical History:  Diagnosis Date   Arthritis     Depression    Diabetes mellitus without complication (HCC)    Dysrhythmia    palpitations   GERD (gastroesophageal reflux disease)    TUMS PRN   Headache    H/O MIGRAINES   History of kidney stones    Hypertension    Palpitations     Family History: Family History  Problem Relation Age of Onset   Diabetes Mother    Hypertension Mother    Glaucoma Maternal Grandmother    Alzheimer's disease Maternal Grandmother    Breast cancer Neg Hx     Social History   Socioeconomic History   Marital status: Married    Spouse name: Not on file   Number of children: Not on file   Years of education: Not on file   Highest education level: Not on file  Occupational History   Not on file  Tobacco Use   Smoking status: Former    Packs/day: 0.50    Years: 12.00    Pack years: 6.00    Types: Cigarettes    Quit date: 08/20/1985    Years since quitting: 35.8   Smokeless tobacco: Never  Vaping Use   Vaping Use: Never used  Substance and Sexual Activity   Alcohol use: Yes    Comment: rarely   Drug use: No   Sexual activity: Not on file  Other Topics Concern   Not on file  Social History Narrative   Not on file   Social Determinants of Health   Financial Resource Strain: Not on file  Food Insecurity: Not on file  Transportation Needs: Not on file  Physical Activity: Not on file  Stress: Not on file  Social Connections: Not on file  Intimate Partner Violence: Not on file      Review of Systems  Constitutional:  Negative for chills, fatigue and unexpected weight change.  HENT:  Negative for congestion, rhinorrhea, sneezing and sore throat.   Eyes:  Negative for redness.  Respiratory:  Negative for cough, chest tightness and shortness of breath.   Cardiovascular:  Negative for chest pain and palpitations.  Gastrointestinal:  Negative for abdominal pain, constipation, diarrhea, nausea and vomiting.  Genitourinary:  Negative for dysuria and frequency.  Musculoskeletal:   Negative for arthralgias, back pain, joint swelling and neck pain.  Skin:  Negative for rash.  Neurological: Negative.  Negative for tremors and numbness.  Hematological:  Negative for adenopathy. Does not bruise/bleed easily.  Psychiatric/Behavioral:  Negative for behavioral problems (Depression), sleep disturbance and suicidal ideas. The patient is not nervous/anxious.    Vital Signs: BP 118/82   Pulse 84   Temp 98.1 F (36.7 C)   Resp 16   Ht '5\' 5"'$  (1.651 m)   Wt 172 lb 9.6 oz (78.3 kg)   SpO2 98%   BMI 28.72 kg/m    Physical Exam Vitals reviewed.  Constitutional:      Appearance: Normal appearance.  HENT:  Head: Normocephalic and atraumatic.  Eyes:     Extraocular Movements: Extraocular movements intact.     Pupils: Pupils are equal, round, and reactive to light.  Cardiovascular:     Rate and Rhythm: Normal rate and regular rhythm.  Pulmonary:     Effort: Pulmonary effort is normal. No respiratory distress.  Neurological:     Mental Status: She is alert and oriented to person, place, and time.     Cranial Nerves: No cranial nerve deficit.     Coordination: Coordination normal.     Gait: Gait normal.  Psychiatric:        Mood and Affect: Mood normal.        Behavior: Behavior normal.     Assessment/Plan: 1. Type 2 diabetes mellitus with diabetic neuropathy, with long-term current use of insulin (HCC) A1C elevated, increased ozempic dose. Recheck A1C in 3 months - POCT HgB A1C - Semaglutide,0.25 or 0.'5MG'$ /DOS, 2 MG/1.5ML SOPN; Inject 0.5 mg into the skin once a week.  Dispense: 3 mL; Refill: 1  2. Acute cystitis without hematuria Empiric antibiotic treatment prescribed - nitrofurantoin, macrocrystal-monohydrate, (MACROBID) 100 MG capsule; Take 1 capsule (100 mg total) by mouth 2 (two) times daily for 10 days.  Dispense: 20 capsule; Refill: 0  3. Atrophic vaginitis Estrogen cream prescribed.  - conjugated estrogens (PREMARIN) vaginal cream; Place 1  Applicatorful vaginally at bedtime. Daily x 2 weeks then decrease to twice weekly.  Dispense: 30 g; Refill: 3  4. Dysuria UA abnormal, urine sent for culture - CULTURE, URINE COMPREHENSIVE - POCT Urinalysis Dipstick   General Counseling: parker coutant understanding of the findings of todays visit and agrees with plan of treatment. I have discussed any further diagnostic evaluation that may be needed or ordered today. We also reviewed her medications today. she has been encouraged to call the office with any questions or concerns that should arise related to todays visit.    Orders Placed This Encounter  Procedures   CULTURE, URINE COMPREHENSIVE   POCT HgB A1C   POCT Urinalysis Dipstick    Meds ordered this encounter  Medications   nitrofurantoin, macrocrystal-monohydrate, (MACROBID) 100 MG capsule    Sig: Take 1 capsule (100 mg total) by mouth 2 (two) times daily for 10 days.    Dispense:  20 capsule    Refill:  0   conjugated estrogens (PREMARIN) vaginal cream    Sig: Place 1 Applicatorful vaginally at bedtime. Daily x 2 weeks then decrease to twice weekly.    Dispense:  30 g    Refill:  3    May use generic.   Semaglutide,0.25 or 0.'5MG'$ /DOS, 2 MG/1.5ML SOPN    Sig: Inject 0.5 mg into the skin once a week.    Dispense:  3 mL    Refill:  1    Return in about 3 months (around 08/31/2021) for F/U, Recheck A1C, Shaianne Nucci PCP.   Total time spent:30 Minutes Time spent includes review of chart, medications, test results, and follow up plan with the patient.   [place] Controlled Substance Database was reviewed by me.  This patient was seen by Jonetta Osgood, FNP-C in collaboration with Dr. Clayborn Bigness as a part of collaborative care agreement.   Devine Klingel R. Valetta Fuller, MSN, FNP-C Internal medicine

## 2021-06-04 LAB — CULTURE, URINE COMPREHENSIVE

## 2021-06-06 LAB — POCT GLYCOSYLATED HEMOGLOBIN (HGB A1C): Hemoglobin A1C: 7.7 % — AB (ref 4.0–5.6)

## 2021-06-16 ENCOUNTER — Other Ambulatory Visit: Payer: Self-pay | Admitting: Internal Medicine

## 2021-06-27 ENCOUNTER — Other Ambulatory Visit: Payer: Self-pay

## 2021-06-27 MED ORDER — CITALOPRAM HYDROBROMIDE 20 MG PO TABS: ORAL_TABLET | ORAL | 1 refills | Status: DC

## 2021-06-28 ENCOUNTER — Other Ambulatory Visit: Payer: Self-pay | Admitting: Internal Medicine

## 2021-07-01 ENCOUNTER — Other Ambulatory Visit: Payer: Self-pay | Admitting: Internal Medicine

## 2021-07-01 ENCOUNTER — Other Ambulatory Visit: Payer: Self-pay | Admitting: Nurse Practitioner

## 2021-07-01 DIAGNOSIS — E1140 Type 2 diabetes mellitus with diabetic neuropathy, unspecified: Secondary | ICD-10-CM

## 2021-07-04 ENCOUNTER — Other Ambulatory Visit: Payer: Self-pay

## 2021-07-04 MED ORDER — HYDROCHLOROTHIAZIDE 25 MG PO TABS: ORAL_TABLET | ORAL | 1 refills | Status: DC

## 2021-07-05 ENCOUNTER — Other Ambulatory Visit: Payer: Self-pay | Admitting: Nurse Practitioner

## 2021-07-05 MED ORDER — PANTOPRAZOLE SODIUM 40 MG PO TBEC: 40.0000 mg | DELAYED_RELEASE_TABLET | Freq: Every day | ORAL | 3 refills | Status: DC

## 2021-07-13 ENCOUNTER — Other Ambulatory Visit: Payer: Self-pay

## 2021-07-13 MED ORDER — PANTOPRAZOLE SODIUM 40 MG PO TBEC: 40.0000 mg | DELAYED_RELEASE_TABLET | Freq: Every day | ORAL | 3 refills | Status: DC

## 2021-07-28 ENCOUNTER — Telehealth: Payer: Self-pay

## 2021-07-28 NOTE — Telephone Encounter (Signed)
Per request from [facility], medical records faxed to [phone] for surgery with Dr. Sharlet Salina. Sent to be scanned-Toni

## 2021-07-31 ENCOUNTER — Other Ambulatory Visit: Payer: Self-pay | Admitting: Internal Medicine

## 2021-07-31 ENCOUNTER — Other Ambulatory Visit: Payer: Self-pay | Admitting: Nurse Practitioner

## 2021-07-31 DIAGNOSIS — E1140 Type 2 diabetes mellitus with diabetic neuropathy, unspecified: Secondary | ICD-10-CM

## 2021-08-04 ENCOUNTER — Other Ambulatory Visit: Payer: Self-pay

## 2021-08-04 MED ORDER — LEVEMIR FLEXTOUCH 100 UNIT/ML ~~LOC~~ SOPN: 32.0000 [IU] | PEN_INJECTOR | Freq: Every day | SUBCUTANEOUS | 3 refills | Status: DC

## 2021-08-05 HISTORY — PX: OTHER SURGICAL HISTORY: SHX169

## 2021-08-31 ENCOUNTER — Other Ambulatory Visit: Payer: Self-pay

## 2021-08-31 ENCOUNTER — Ambulatory Visit: Payer: BC Managed Care – PPO | Admitting: Nurse Practitioner

## 2021-08-31 ENCOUNTER — Encounter: Payer: Self-pay | Admitting: Nurse Practitioner

## 2021-08-31 VITALS — BP 106/50 | HR 92 | Temp 98.2°F | Resp 16 | Ht 65.0 in | Wt 163.2 lb

## 2021-08-31 DIAGNOSIS — E1140 Type 2 diabetes mellitus with diabetic neuropathy, unspecified: Secondary | ICD-10-CM

## 2021-08-31 DIAGNOSIS — Z1231 Encounter for screening mammogram for malignant neoplasm of breast: Secondary | ICD-10-CM

## 2021-08-31 DIAGNOSIS — Z794 Long term (current) use of insulin: Secondary | ICD-10-CM

## 2021-08-31 DIAGNOSIS — I10 Essential (primary) hypertension: Secondary | ICD-10-CM | POA: Diagnosis not present

## 2021-08-31 LAB — POCT GLYCOSYLATED HEMOGLOBIN (HGB A1C): Hemoglobin A1C: 5.7 % — AB (ref 4.0–5.6)

## 2021-08-31 NOTE — Progress Notes (Signed)
[facility] [place], [place] [address]  Internal MEDICINE  Office Visit Note  Patient Name: Belinda Soto  [id number]  [id number]  Date of Service: 08/31/2021  Chief Complaint  Patient presents with   Follow-up   Depression   Diabetes   Gastroesophageal Reflux   Hypertension    HPI Belinda Soto presents for a follow up visit for diabetes and hypertension. Belinda Soto is due to have Belinda Soto A1C checked again and it is 5.7 today. This is a significant improvement from 7.7 in September. Belinda Soto has lost 9 lbs since Belinda Soto previous office visit on 06/01/2021.  Belinda Soto also had a torn rotator cuff repair in September and is getting physical therapy now. Torn rotator cuff  in sept.  Belinda Soto is due for a mammogram also.      Current Medication: Outpatient Encounter Medications as of 08/31/2021  Medication Sig   amitriptyline (ELAVIL) 10 MG tablet TAKE 1 TABLET BY MOUTH AT BEDTIME   amLODipine (NORVASC) 2.5 MG tablet Take 1 tablet by mouth once daily   aspirin 81 MG tablet Take 81 mg by mouth daily.   calcium carbonate (TUMS - DOSED IN MG ELEMENTAL CALCIUM) 500 MG chewable tablet Chew 1 tablet by mouth as needed for indigestion or heartburn.   citalopram (CELEXA) 20 MG tablet TAKE 1 TABLET BY MOUTH ONCE DAILY AROUND  7-8PM  FOR  DEPRESSION   conjugated estrogens (PREMARIN) vaginal cream Place 1 Applicatorful vaginally at bedtime. Daily x 2 weeks then decrease to twice weekly.   glimepiride (AMARYL) 2 MG tablet Take 1 tablet by mouth twice daily   glucose blood (ONETOUCH ULTRA) test strip Use as instructed   hydrochlorothiazide (HYDRODIURIL) 25 MG tablet TAKE 1 TABLET BY MOUTH ONCE DAILY IN THE MORNING   hydrocortisone 2.5 % ointment Apply topically.   insulin detemir (LEVEMIR FLEXTOUCH) 100 UNIT/ML FlexPen Inject 32 Units into the skin at bedtime.   Insulin Pen Needle (NOVOFINE) 32G X 6 MM MISC USE WITH VICTOZA ONCE DAILY   Insulin Pen Needle 32G X 4 MM MISC Use as directed with victoza daily    losartan (COZAAR) 100 MG tablet Take 1 tablet (100 mg total) by mouth every morning.   meloxicam (MOBIC) 7.5 MG tablet Take 1 tablet by mouth twice daily   pantoprazole (PROTONIX) 40 MG tablet Take 1 tablet (40 mg total) by mouth daily.   Semaglutide,0.25 or 0.5MG /DOS, 2 MG/1.5ML SOPN Inject 0.5 mg into the skin once a week.   VITAMIN D PO Take by mouth.   [DISCONTINUED] gabapentin (NEURONTIN) 600 MG tablet TAKE 2 TABLETS BY MOUTH THREE TIMES DAILY   [DISCONTINUED] metFORMIN (GLUCOPHAGE) 500 MG tablet Take 2 tablets by mouth twice daily   [DISCONTINUED] metoprolol tartrate (LOPRESSOR) 100 MG tablet Take 1 tablet by mouth twice daily   No facility-administered encounter medications on file as of 08/31/2021.    Surgical History: Past Surgical History:  Procedure Laterality Date   c sections     COLONOSCOPY N/A 03/08/2015   Procedure: COLONOSCOPY;  Surgeon: Hulen Luster, MD;  [facility]: [facility] ENDOSCOPY;  Service: Gastroenterology;  Laterality: N/A;   COLONOSCOPY  02/2015   FISSURECTOMY N/A 08/24/2017   Procedure: FISSURECTOMY;  Surgeon: Christene Lye, MD;  Location: [facility] ORS;  Service: General;  Laterality: N/A;   FOOT SURGERY Left 2005   rotator cuff surgery Right 08/05/2021   SPHINCTEROTOMY N/A 08/24/2017   Procedure: SPHINCTEROTOMY;  Surgeon: Christene Lye, MD;  Location: [facility] ORS;  Service: General;  Laterality: N/A;    Medical History: Past Medical History:  Diagnosis Date   Arthritis    Depression    Diabetes mellitus without complication (HCC)    Dysrhythmia    palpitations   GERD (gastroesophageal reflux disease)    TUMS PRN   Headache    H/O MIGRAINES   History of kidney stones    Hypertension    Palpitations     Family History: Family History  Problem Relation Age of Onset   Diabetes Mother    Hypertension Mother    Glaucoma Maternal Grandmother    Alzheimer's disease Maternal Grandmother    Breast cancer Neg Hx     Social History    Socioeconomic History   Marital status: Married    Spouse name: Not on file   Number of children: Not on file   Years of education: Not on file   Highest education level: Not on file  Occupational History   Not on file  Tobacco Use   Smoking status: Former    Packs/day: 0.50    Years: 12.00    Pack years: 6.00    Types: Cigarettes    Quit date: 08/20/1985    Years since quitting: 36.1   Smokeless tobacco: Never  Vaping Use   Vaping Use: Never used  Substance and Sexual Activity   Alcohol use: Yes    Comment: rarely   Drug use: No   Sexual activity: Not on file  Other Topics Concern   Not on file  Social History Narrative   Not on file   Social Determinants of Health   Financial Resource Strain: Not on file  Food Insecurity: Not on file  Transportation Needs: Not on file  Physical Activity: Not on file  Stress: Not on file  Social Connections: Not on file  Intimate Partner Violence: Not on file      Review of Systems  Constitutional:  Negative for chills, fatigue and unexpected weight change.  HENT:  Negative for congestion, rhinorrhea, sneezing and sore throat.   Eyes:  Negative for redness.  Respiratory:  Negative for cough, chest tightness and shortness of breath.   Cardiovascular:  Negative for chest pain and palpitations.  Gastrointestinal:  Negative for abdominal pain, constipation, diarrhea, nausea and vomiting.  Genitourinary:  Negative for dysuria and frequency.  Musculoskeletal:  Negative for arthralgias, back pain, joint swelling and neck pain.  Skin:  Negative for rash.  Neurological: Negative.  Negative for tremors and numbness.  Hematological:  Negative for adenopathy. Does not bruise/bleed easily.  Psychiatric/Behavioral:  Negative for behavioral problems (Depression), sleep disturbance and suicidal ideas. The patient is not nervous/anxious.    Vital Signs: BP (!) 106/50   Pulse 92   Temp 98.2 F (36.8 C)   Resp 16   Ht 5\' 5"  (1.651 m)    Wt 163 lb 3.2 oz (74 kg)   SpO2 99%   BMI 27.16 kg/m    Physical Exam Vitals reviewed.  Constitutional:      General: Belinda Soto is not in acute distress.    Appearance: Normal appearance. Belinda Soto is normal weight. Belinda Soto is not ill-appearing.  HENT:     Head: Normocephalic and atraumatic.  Eyes:     Pupils: Pupils are equal, round, and reactive to light.  Cardiovascular:     Rate and Rhythm: Normal rate and regular rhythm.  Pulmonary:     Effort: Pulmonary effort is normal. No respiratory distress.  Neurological:     Mental Status: Belinda Soto  is alert and oriented to person, place, and time.     Cranial Nerves: No cranial nerve deficit.     Coordination: Coordination normal.     Gait: Gait normal.  Psychiatric:        Mood and Affect: Mood normal.        Behavior: Behavior normal.       Assessment/Plan: 1. Type 2 diabetes mellitus with diabetic neuropathy, with long-term current use of insulin (HCC) Significant improvement in A1C. Consider decreasing metformin to once a day. Patient may titrate levemir if getting consistent low blood glucose levels, may need to decrease levemir. Currently blood glucose levels have been good per patient. Repeat A1C in 3 months, if still stable, will officially decrease/discontinue at least 1 medication.  - POCT HgB A1C  2. Essential hypertension Blood pressure is slightly low today, will continue to monitor. Patient takes amlodipine, HCTZ, losartan and metoprolol. If staying consistently low, may need to adjust doses of medications.   3. Encounter for screening mammogram for malignant neoplasm of breast Screening mammogram ordered.  - MM 3D SCREEN BREAST BILATERAL; Future   General Counseling: warrene kapfer understanding of the findings of todays visit and agrees with plan of treatment. I have discussed any further diagnostic evaluation that may be needed or ordered today. We also reviewed Belinda Soto medications today. Belinda Soto has been encouraged to call the office  with any questions or concerns that should arise related to todays visit.    Orders Placed This Encounter  Procedures   POCT HgB A1C    No orders of the defined types were placed in this encounter.   Return in about 3 months (around 11/29/2021) for F/U, Recheck A1C, Rual Vermeer PCP.   Total time spent:30 Minutes Time spent includes review of chart, medications, test results, and follow up plan with the patient.   [place] Controlled Substance Database was reviewed by me.  This patient was seen by Jonetta Osgood, FNP-C in collaboration with Dr. Clayborn Bigness as a part of collaborative care agreement.   Brilynn Biasi R. Valetta Fuller, MSN, FNP-C Internal medicine

## 2021-09-05 ENCOUNTER — Other Ambulatory Visit: Payer: Self-pay

## 2021-09-05 MED ORDER — METOPROLOL TARTRATE 100 MG PO TABS: 100.0000 mg | ORAL_TABLET | Freq: Two times a day (BID) | ORAL | 1 refills | Status: DC

## 2021-09-13 ENCOUNTER — Other Ambulatory Visit: Payer: Self-pay | Admitting: Nurse Practitioner

## 2021-09-13 DIAGNOSIS — E1140 Type 2 diabetes mellitus with diabetic neuropathy, unspecified: Secondary | ICD-10-CM

## 2021-09-19 ENCOUNTER — Other Ambulatory Visit: Payer: Self-pay | Admitting: Internal Medicine

## 2021-10-02 ENCOUNTER — Encounter: Payer: Self-pay | Admitting: Nurse Practitioner

## 2021-10-04 ENCOUNTER — Other Ambulatory Visit: Payer: Self-pay | Admitting: Nurse Practitioner

## 2021-10-05 ENCOUNTER — Other Ambulatory Visit: Payer: Self-pay | Admitting: Nurse Practitioner

## 2021-10-05 DIAGNOSIS — E1140 Type 2 diabetes mellitus with diabetic neuropathy, unspecified: Secondary | ICD-10-CM

## 2021-10-14 ENCOUNTER — Other Ambulatory Visit: Payer: Self-pay | Admitting: Nurse Practitioner

## 2021-11-02 ENCOUNTER — Other Ambulatory Visit: Payer: Self-pay

## 2021-11-02 DIAGNOSIS — E1140 Type 2 diabetes mellitus with diabetic neuropathy, unspecified: Secondary | ICD-10-CM

## 2021-11-02 MED ORDER — GABAPENTIN 600 MG PO TABS: 1200.0000 mg | ORAL_TABLET | Freq: Three times a day (TID) | ORAL | 1 refills | Status: DC

## 2021-11-04 ENCOUNTER — Other Ambulatory Visit: Payer: Self-pay | Admitting: Nurse Practitioner

## 2021-11-04 DIAGNOSIS — Z794 Long term (current) use of insulin: Secondary | ICD-10-CM

## 2021-11-04 DIAGNOSIS — E1140 Type 2 diabetes mellitus with diabetic neuropathy, unspecified: Secondary | ICD-10-CM

## 2021-11-07 ENCOUNTER — Telehealth: Payer: Self-pay

## 2021-11-07 MED ORDER — GLIMEPIRIDE 2 MG PO TABS: 2.0000 mg | ORAL_TABLET | Freq: Two times a day (BID) | ORAL | 1 refills | Status: DC

## 2021-11-07 NOTE — Telephone Encounter (Signed)
Pt called for glimepiride  refills send to [facility]

## 2021-11-16 ENCOUNTER — Telehealth: Payer: Self-pay

## 2021-11-16 ENCOUNTER — Other Ambulatory Visit: Payer: Self-pay

## 2021-11-16 NOTE — Telephone Encounter (Signed)
Send basaglar insulin same dose, can come in and pick up sample if we have some

## 2021-11-17 ENCOUNTER — Other Ambulatory Visit: Payer: Self-pay

## 2021-11-17 MED ORDER — BASAGLAR KWIKPEN 100 UNIT/ML ~~LOC~~ SOPN: PEN_INJECTOR | SUBCUTANEOUS | 3 refills | Status: DC

## 2021-11-17 NOTE — Telephone Encounter (Signed)
Sent in [place], spoke to pt and informed her it was sent, mentioned we had samples if she'd like one.

## 2021-11-30 ENCOUNTER — Encounter: Payer: Self-pay | Admitting: Nurse Practitioner

## 2021-11-30 ENCOUNTER — Ambulatory Visit: Payer: BC Managed Care – PPO | Admitting: Nurse Practitioner

## 2021-11-30 ENCOUNTER — Other Ambulatory Visit: Payer: Self-pay

## 2021-11-30 VITALS — BP 118/82 | HR 85 | Temp 98.4°F | Resp 16 | Ht 65.0 in | Wt 160.6 lb

## 2021-11-30 DIAGNOSIS — E663 Overweight: Secondary | ICD-10-CM

## 2021-11-30 DIAGNOSIS — I10 Essential (primary) hypertension: Secondary | ICD-10-CM

## 2021-11-30 DIAGNOSIS — Z794 Long term (current) use of insulin: Secondary | ICD-10-CM

## 2021-11-30 DIAGNOSIS — E1140 Type 2 diabetes mellitus with diabetic neuropathy, unspecified: Secondary | ICD-10-CM

## 2021-11-30 DIAGNOSIS — Z6826 Body mass index (BMI) 26.0-26.9, adult: Secondary | ICD-10-CM

## 2021-11-30 LAB — POCT GLYCOSYLATED HEMOGLOBIN (HGB A1C): Hemoglobin A1C: 5.8 % — AB (ref 4.0–5.6)

## 2021-11-30 MED ORDER — SEMAGLUTIDE (1 MG/DOSE) 4 MG/3ML ~~LOC~~ SOPN: 1.0000 mg | PEN_INJECTOR | SUBCUTANEOUS | 3 refills | Status: DC

## 2021-11-30 NOTE — Progress Notes (Signed)
 [facility] [address] [place], [place] [address]  Internal MEDICINE  Office Visit Note  Patient Name: Belinda Soto  [id number]  [id number]  Date of Service: 11/30/2021  Chief Complaint  Patient presents with   Follow-up   Depression   Diabetes   Gastroesophageal Reflux   Hypertension    HPI Hind presents for a follow-up visit for diabetes, hypertension, and depression.  Her A1c is 5.8 today which is stable and consistent.  Her A1c in December was 5.7.  She has lost 3 pounds since her previous office visit.  She would like to lose more weight and is interested in increasing her Ozempic  dose.  Her blood pressure is well controlled with current medications.  She has no other concerns or questions at this time.  She denies any pain.   Current Medication: Outpatient Encounter Medications as of 11/30/2021  Medication Sig   amitriptyline  (ELAVIL ) 10 MG tablet TAKE 1 TABLET BY MOUTH AT BEDTIME   amLODipine  (NORVASC ) 2.5 MG tablet Take 1 tablet by mouth once daily   aspirin  81 MG tablet Take 81 mg by mouth daily.   calcium carbonate (TUMS - DOSED IN MG ELEMENTAL CALCIUM) 500 MG chewable tablet Chew 1 tablet by mouth as needed for indigestion or heartburn.   citalopram  (CELEXA ) 20 MG tablet TAKE 1 TABLET BY MOUTH ONCE DAILY AROUND  7-8PM  FOR  DEPRESSION   conjugated estrogens  (PREMARIN ) vaginal cream Place 1 Applicatorful vaginally at bedtime. Daily x 2 weeks then decrease to twice weekly.   gabapentin  (NEURONTIN ) 600 MG tablet Take 2 tablets (1,200 mg total) by mouth 3 (three) times daily.   glimepiride  (AMARYL ) 2 MG tablet Take 1 tablet (2 mg total) by mouth 2 (two) times daily.   glucose blood (ONETOUCH ULTRA) test strip Use as instructed   hydrochlorothiazide  (HYDRODIURIL ) 25 MG tablet TAKE 1 TABLET BY MOUTH ONCE DAILY IN THE MORNING   hydrocortisone 2.5 % ointment Apply topically.   Insulin  Glargine (BASAGLAR  KWIKPEN) 100 UNIT/ML Inject 32 Units into the skin at  bedtime.   Insulin  Pen Needle (NOVOFINE) 32G X 6 MM MISC USE WITH VICTOZA  ONCE DAILY   Insulin  Pen Needle 32G X 4 MM MISC Use as directed with victoza  daily   losartan  (COZAAR ) 100 MG tablet Take 1 tablet (100 mg total) by mouth every morning.   meloxicam  (MOBIC ) 7.5 MG tablet Take 1 tablet by mouth twice daily   metFORMIN  (GLUCOPHAGE ) 500 MG tablet Take 2 tablets by mouth twice daily   metoprolol  tartrate (LOPRESSOR ) 100 MG tablet Take 1 tablet (100 mg total) by mouth 2 (two) times daily.   pantoprazole  (PROTONIX ) 40 MG tablet Take 1 tablet (40 mg total) by mouth daily.   Semaglutide , 1 MG/DOSE, 4 MG/3ML SOPN Inject 1 mg as directed once a week.   VITAMIN D  PO Take by mouth.   [DISCONTINUED] OZEMPIC , 0.25 OR 0.5 MG/DOSE, 2 MG/1.5ML SOPN INJECT 0.5 MG INTO THE SKIN ONCE A WEEK   No facility-administered encounter medications on file as of 11/30/2021.    Surgical History: Past Surgical History:  Procedure Laterality Date   c sections     COLONOSCOPY N/A 03/08/2015   Procedure: COLONOSCOPY;  Surgeon: Deward CINDERELLA Piedmont, MD;  Location: [facility] ENDOSCOPY;  Service: Gastroenterology;  Laterality: N/A;   COLONOSCOPY  02/2015   FISSURECTOMY N/A 08/24/2017   Procedure: FISSURECTOMY;  Surgeon: Dellie Louanne MATSU, MD;  Location: [facility] ORS;  Service: General;  Laterality: N/A;   FOOT SURGERY Left  2005   rotator cuff surgery Right 08/05/2021   SPHINCTEROTOMY N/A 08/24/2017   Procedure: SPHINCTEROTOMY;  Surgeon: Dellie Louanne MATSU, MD;  Location: [facility] ORS;  Service: General;  Laterality: N/A;    Medical History: Past Medical History:  Diagnosis Date   Arthritis    Depression    Diabetes mellitus without complication (HCC)    Dysrhythmia    palpitations   GERD (gastroesophageal reflux disease)    TUMS PRN   Headache    H/O MIGRAINES   History of kidney stones    Hypertension    Palpitations     Family History: Family History  Problem Relation Age of Onset   Diabetes Mother     Hypertension Mother    Glaucoma Maternal Grandmother    Alzheimer's disease Maternal Grandmother    Breast cancer Neg Hx     Social History   Socioeconomic History   Marital status: Married    Spouse name: Not on file   Number of children: Not on file   Years of education: Not on file   Highest education level: Not on file  Occupational History   Not on file  Tobacco Use   Smoking status: Former    Packs/day: 0.50    Years: 12.00    Pack years: 6.00    Types: Cigarettes    Quit date: 08/20/1985    Years since quitting: 36.3   Smokeless tobacco: Never  Vaping Use   Vaping Use: Never used  Substance and Sexual Activity   Alcohol use: Yes    Comment: rarely   Drug use: No   Sexual activity: Not on file  Other Topics Concern   Not on file  Social History Narrative   Not on file   Social Determinants of Health   Financial Resource Strain: Not on file  Food Insecurity: Not on file  Transportation Needs: Not on file  Physical Activity: Not on file  Stress: Not on file  Social Connections: Not on file  Intimate Partner Violence: Not on file      Review of Systems  Constitutional:  Negative for chills, fatigue and unexpected weight change.  HENT:  Negative for congestion, rhinorrhea, sneezing and sore throat.   Eyes:  Negative for redness.  Respiratory:  Negative for cough, chest tightness and shortness of breath.   Cardiovascular:  Negative for chest pain and palpitations.  Gastrointestinal:  Negative for abdominal pain, constipation, diarrhea, nausea and vomiting.  Genitourinary:  Negative for dysuria and frequency.  Musculoskeletal:  Negative for arthralgias, back pain, joint swelling and neck pain.  Skin:  Negative for rash.  Neurological: Negative.  Negative for tremors and numbness.  Hematological:  Negative for adenopathy. Does not bruise/bleed easily.  Psychiatric/Behavioral:  Negative for behavioral problems (Depression), sleep disturbance and suicidal  ideas. The patient is not nervous/anxious.    Vital Signs: BP 118/82   Pulse 85   Temp 98.4 F (36.9 C)   Resp 16   Ht 5' 5 (1.651 m)   Wt 160 lb 9.6 oz (72.8 kg)   SpO2 99%   BMI 26.73 kg/m    Physical Exam Vitals reviewed.  Constitutional:      General: She is not in acute distress.    Appearance: Normal appearance. She is obese. She is not ill-appearing.  HENT:     Head: Normocephalic and atraumatic.  Eyes:     Pupils: Pupils are equal, round, and reactive to light.  Cardiovascular:     Rate and  Rhythm: Normal rate and regular rhythm.  Pulmonary:     Effort: Pulmonary effort is normal. No respiratory distress.  Neurological:     Mental Status: She is alert and oriented to person, place, and time.  Psychiatric:        Mood and Affect: Mood normal.        Behavior: Behavior normal.       Assessment/Plan: 1. Type 2 diabetes mellitus with diabetic neuropathy, with long-term current use of insulin  (HCC) A1c remains stable at 5.8.  Ozempic  dose increased to aid in weight loss and continued maintenance of A1c control.  Follow-up in 3 months for repeat A1c - POCT HgB A1C  2. Essential hypertension Blood pressure is stable and well-controlled on current medications. No changes.  3. Overweight with body mass index (BMI) of 26 to 26.9 in adult Patient would like to lose a little bit more weight as her BMI is still slightly elevated at 26.73.  Ozempic  dose increased.  Follow-up in 3 months   General Counseling: tayja manzer understanding of the findings of todays visit and agrees with plan of treatment. I have discussed any further diagnostic evaluation that may be needed or ordered today. We also reviewed her medications today. she has been encouraged to call the office with any questions or concerns that should arise related to todays visit.    Orders Placed This Encounter  Procedures   POCT HgB A1C    Meds ordered this encounter  Medications   Semaglutide ,  1 MG/DOSE, 4 MG/3ML SOPN    Sig: Inject 1 mg as directed once a week.    Dispense:  3 mL    Refill:  3    Please note increased dose, discontinue previous prescription for ozempic .    Return in about 3 months (around 03/02/2022) for F/U, Recheck A1C, Anaysia Germer PCP.   Total time spent:30 Minutes Time spent includes review of chart, medications, test results, and follow up plan with the patient.   [place] Controlled Substance Database was reviewed by me.  This patient was seen by Mardy Maxin, FNP-C in collaboration with Dr. Sigrid Bathe as a part of collaborative care agreement.   Khup Sapia R. Maxin, MSN, FNP-C Internal medicine

## 2021-12-01 ENCOUNTER — Encounter: Payer: Self-pay | Admitting: Nurse Practitioner

## 2021-12-08 ENCOUNTER — Other Ambulatory Visit: Payer: Self-pay

## 2021-12-08 MED ORDER — SEMAGLUTIDE (1 MG/DOSE) 4 MG/3ML ~~LOC~~ SOPN: 1.0000 mg | PEN_INJECTOR | SUBCUTANEOUS | 3 refills | Status: DC

## 2021-12-15 ENCOUNTER — Other Ambulatory Visit: Payer: Self-pay

## 2021-12-15 ENCOUNTER — Ambulatory Visit
Admission: RE | Admit: 2021-12-15 | Discharge: 2021-12-15 | Disposition: A | Discharge status: Home / Self Care | Payer: BC Managed Care – PPO | Source: Ambulatory Visit | Attending: Nurse Practitioner | Admitting: Nurse Practitioner

## 2021-12-15 DIAGNOSIS — Z1231 Encounter for screening mammogram for malignant neoplasm of breast: Secondary | ICD-10-CM | POA: Diagnosis not present

## 2021-12-22 ENCOUNTER — Other Ambulatory Visit: Payer: Self-pay | Admitting: Nurse Practitioner

## 2022-01-01 ENCOUNTER — Other Ambulatory Visit: Payer: Self-pay | Admitting: Nurse Practitioner

## 2022-01-09 ENCOUNTER — Other Ambulatory Visit: Payer: Self-pay | Admitting: Internal Medicine

## 2022-01-10 ENCOUNTER — Other Ambulatory Visit: Payer: Self-pay | Admitting: Internal Medicine

## 2022-02-05 DIAGNOSIS — R051 Acute cough: Secondary | ICD-10-CM | POA: Diagnosis not present

## 2022-02-05 DIAGNOSIS — R070 Pain in throat: Secondary | ICD-10-CM | POA: Diagnosis not present

## 2022-02-05 DIAGNOSIS — J029 Acute pharyngitis, unspecified: Secondary | ICD-10-CM | POA: Diagnosis not present

## 2022-02-05 DIAGNOSIS — Z7689 Persons encountering health services in other specified circumstances: Secondary | ICD-10-CM | POA: Diagnosis not present

## 2022-02-10 ENCOUNTER — Other Ambulatory Visit: Payer: Self-pay | Admitting: Nurse Practitioner

## 2022-02-10 DIAGNOSIS — E1140 Type 2 diabetes mellitus with diabetic neuropathy, unspecified: Secondary | ICD-10-CM

## 2022-02-27 ENCOUNTER — Encounter: Payer: BC Managed Care – PPO | Admitting: Nurse Practitioner

## 2022-03-01 ENCOUNTER — Ambulatory Visit: Payer: BC Managed Care – PPO | Admitting: Nurse Practitioner

## 2022-03-13 ENCOUNTER — Other Ambulatory Visit: Payer: Self-pay | Admitting: Nurse Practitioner

## 2022-03-14 ENCOUNTER — Ambulatory Visit (INDEPENDENT_AMBULATORY_CARE_PROVIDER_SITE_OTHER): Payer: BC Managed Care – PPO | Admitting: Nurse Practitioner

## 2022-03-14 ENCOUNTER — Encounter: Payer: Self-pay | Admitting: Nurse Practitioner

## 2022-03-14 ENCOUNTER — Other Ambulatory Visit: Payer: Self-pay | Admitting: Internal Medicine

## 2022-03-14 VITALS — BP 130/72 | HR 78 | Temp 98.4°F | Resp 16 | Ht 65.0 in | Wt 157.6 lb

## 2022-03-14 DIAGNOSIS — R748 Abnormal levels of other serum enzymes: Secondary | ICD-10-CM

## 2022-03-14 DIAGNOSIS — E559 Vitamin D deficiency, unspecified: Secondary | ICD-10-CM

## 2022-03-14 DIAGNOSIS — E1140 Type 2 diabetes mellitus with diabetic neuropathy, unspecified: Secondary | ICD-10-CM

## 2022-03-14 DIAGNOSIS — R300 Dysuria: Secondary | ICD-10-CM | POA: Diagnosis not present

## 2022-03-14 DIAGNOSIS — Z6826 Body mass index (BMI) 26.0-26.9, adult: Secondary | ICD-10-CM

## 2022-03-14 DIAGNOSIS — D509 Iron deficiency anemia, unspecified: Secondary | ICD-10-CM

## 2022-03-14 DIAGNOSIS — E663 Overweight: Secondary | ICD-10-CM

## 2022-03-14 DIAGNOSIS — Z794 Long term (current) use of insulin: Secondary | ICD-10-CM

## 2022-03-14 DIAGNOSIS — Z0001 Encounter for general adult medical examination with abnormal findings: Secondary | ICD-10-CM

## 2022-03-14 DIAGNOSIS — G252 Other specified forms of tremor: Secondary | ICD-10-CM | POA: Diagnosis not present

## 2022-03-14 DIAGNOSIS — E782 Mixed hyperlipidemia: Secondary | ICD-10-CM

## 2022-03-14 DIAGNOSIS — K219 Gastro-esophageal reflux disease without esophagitis: Secondary | ICD-10-CM

## 2022-03-14 LAB — POCT GLYCOSYLATED HEMOGLOBIN (HGB A1C): Hemoglobin A1C: 5.5 % (ref 4.0–5.6)

## 2022-03-14 NOTE — Progress Notes (Signed)
[facility] [place], [place] [address]  Internal MEDICINE  Office Visit Note  Patient Name: Belinda Soto  [id number]  [id number]  Date of Service: 03/14/2022  Chief Complaint  Patient presents with   Annual Exam    Episodes of twitching and jerking in both arms and hands, hands shake a lot, bump on right side of upper back   Diabetes   Depression   Gastroesophageal Reflux   Hypertension    HPI Belinda Soto presents for an annual well visit and physical exam.  She is a well-appearing 66 year old female with hypertension, type 2 diabetes controlled, GERD, arthritis, anxiety, and diabetic neuropathy.  She is due for diabetic foot exam today.  She reports she had a diabetic eye exam a few months ago.  She is not due for her Pap smear until 2025 and is not due for a routine colonoscopy until 2026.  She had her routine screening mammogram in March this year and it was BI-RADS Category 1 negative.  Her A1c is due to be checked today and was within normal range at 5.5 which is a slight improvement from 5.8 in March earlier this year. She has lost a total of 16 pounds since August last year. She reports having a tremor that seems to happen more when she is moving she reports twitching and jerking of bilateral upper extremities and hands shake a lot.  She says it feels like it comes on randomly but has noticed that it does not happen at rest but when she is moving and trying to do things.  She is on amitriptyline which she has been on since 2019 most likely for prevention of headaches or to help her sleep and amitriptyline can cause a kinetic tremor She has had no other significant changes in her diet or daily lifestyle.  She denies any new or worsening pain or any other significant issues.  She would prefer not to be referred out to a specialist if possible regarding the tremor but understands that this may be the route we have to take at some point if it does not  resolve.    Current Medication: Outpatient Encounter Medications as of 03/14/2022  Medication Sig   amLODipine (NORVASC) 2.5 MG tablet Take 1 tablet by mouth once daily   aspirin 81 MG tablet Take 81 mg by mouth daily.   calcium carbonate (TUMS - DOSED IN MG ELEMENTAL CALCIUM) 500 MG chewable tablet Chew 1 tablet by mouth as needed for indigestion or heartburn.   conjugated estrogens (PREMARIN) vaginal cream Place 1 Applicatorful vaginally at bedtime. Daily x 2 weeks then decrease to twice weekly.   gabapentin (NEURONTIN) 600 MG tablet TAKE 2 TABLETS BY MOUTH THREE TIMES DAILY   glimepiride (AMARYL) 2 MG tablet Take 1 tablet (2 mg total) by mouth 2 (two) times daily.   glucose blood (ONETOUCH ULTRA) test strip Use as instructed   hydrochlorothiazide (HYDRODIURIL) 25 MG tablet TAKE 1 TABLET BY MOUTH ONCE DAILY IN THE MORNING   hydrocortisone 2.5 % ointment Apply topically.   Insulin Glargine (BASAGLAR KWIKPEN) 100 UNIT/ML Inject 32 Units into the skin at bedtime.   Insulin Pen Needle (NOVOFINE) 32G X 6 MM MISC USE WITH VICTOZA ONCE DAILY   Insulin Pen Needle 32G X 4 MM MISC Use as directed with victoza daily   losartan (COZAAR) 100 MG tablet Take 1 tablet (100 mg total) by mouth every morning.   meloxicam (MOBIC) 7.5 MG tablet Take 1 tablet by  mouth twice daily   metFORMIN (GLUCOPHAGE) 500 MG tablet Take 2 tablets by mouth twice daily   metoprolol tartrate (LOPRESSOR) 100 MG tablet Take 1 tablet (100 mg total) by mouth 2 (two) times daily.   pantoprazole (PROTONIX) 40 MG tablet Take 1 tablet (40 mg total) by mouth daily.   Semaglutide, 1 MG/DOSE, 4 MG/3ML SOPN Inject 1 mg as directed once a week.   VITAMIN D PO Take by mouth.   [DISCONTINUED] amitriptyline (ELAVIL) 10 MG tablet TAKE 1 TABLET BY MOUTH AT BEDTIME   [DISCONTINUED] citalopram (CELEXA) 20 MG tablet TAKE 1 TABLET BY MOUTH ONCE DAILY AROUND  7-8PM  FOR  DEPRESSION   No facility-administered encounter medications on file as of  03/14/2022.    Surgical History: Past Surgical History:  Procedure Laterality Date   c sections     COLONOSCOPY N/A 03/08/2015   Procedure: COLONOSCOPY;  Surgeon: Hulen Luster, MD;  Location: [facility] ENDOSCOPY;  Service: Gastroenterology;  Laterality: N/A;   COLONOSCOPY  02/2015   FISSURECTOMY N/A 08/24/2017   Procedure: FISSURECTOMY;  Surgeon: Christene Lye, MD;  Location: [facility] ORS;  Service: General;  Laterality: N/A;   FOOT SURGERY Left 2005   rotator cuff surgery Right 08/05/2021   SPHINCTEROTOMY N/A 08/24/2017   Procedure: SPHINCTEROTOMY;  Surgeon: Christene Lye, MD;  Location: [facility] ORS;  Service: General;  Laterality: N/A;    Medical History: Past Medical History:  Diagnosis Date   Arthritis    Depression    Diabetes mellitus without complication (HCC)    Dysrhythmia    palpitations   GERD (gastroesophageal reflux disease)    TUMS PRN   Headache    H/O MIGRAINES   History of kidney stones    Hypertension    Palpitations     Family History: Family History  Problem Relation Age of Onset   Diabetes Mother    Hypertension Mother    Glaucoma Maternal Grandmother    Alzheimer's disease Maternal Grandmother    Breast cancer Neg Hx     Social History   Socioeconomic History   Marital status: Married    Spouse name: Not on file   Number of children: Not on file   Years of education: Not on file   Highest education level: Not on file  Occupational History   Not on file  Tobacco Use   Smoking status: Former    Packs/day: 0.50    Years: 12.00    Total pack years: 6.00    Types: Cigarettes    Quit date: 08/20/1985    Years since quitting: 36.5   Smokeless tobacco: Never  Vaping Use   Vaping Use: Never used  Substance and Sexual Activity   Alcohol use: Yes    Comment: rarely   Drug use: No   Sexual activity: Not on file  Other Topics Concern   Not on file  Social History Narrative   Not on file   Social Determinants of Health    Financial Resource Strain: Not on file  Food Insecurity: Not on file  Transportation Needs: Not on file  Physical Activity: Not on file  Stress: Not on file  Social Connections: Not on file  Intimate Partner Violence: Not on file      Review of Systems  Constitutional:  Positive for fatigue. Negative for activity change, appetite change, chills, fever and unexpected weight change.  HENT: Negative.  Negative for congestion, ear pain, rhinorrhea, sore throat and trouble swallowing.   Eyes: Negative.  Respiratory: Negative.  Negative for cough, chest tightness, shortness of breath and wheezing.   Cardiovascular: Negative.  Negative for chest pain and palpitations.  Gastrointestinal: Negative.  Negative for abdominal pain, blood in stool, constipation, diarrhea, nausea and vomiting.  Endocrine: Negative.   Genitourinary: Negative.  Negative for difficulty urinating, dysuria, frequency, hematuria and urgency.  Musculoskeletal: Negative.  Negative for arthralgias, back pain, joint swelling, myalgias and neck pain.  Skin: Negative.  Negative for rash and wound.  Allergic/Immunologic: Negative.  Negative for immunocompromised state.  Neurological:  Positive for dizziness, tremors, weakness, light-headedness, numbness and headaches. Negative for seizures.  Hematological: Negative.   Psychiatric/Behavioral:  Negative for behavioral problems, self-injury and suicidal ideas. The patient is nervous/anxious.     Vital Signs: BP 130/72   Pulse 78   Temp 98.4 F (36.9 C)   Resp 16   Ht 5' 5"  (1.651 m)   Wt 157 lb 9.6 oz (71.5 kg)   SpO2 98%   BMI 26.23 kg/m    Physical Exam Vitals reviewed.  Constitutional:      General: She is not in acute distress.    Appearance: Normal appearance. She is well-developed. She is not ill-appearing or diaphoretic.  HENT:     Head: Normocephalic and atraumatic.     Right Ear: External ear normal. There is impacted cerumen.     Left Ear: External  ear normal. There is impacted cerumen.     Nose: Nose normal. No congestion or rhinorrhea.     Mouth/Throat:     Mouth: Mucous membranes are moist.     Pharynx: Oropharynx is clear. No oropharyngeal exudate or posterior oropharyngeal erythema.  Eyes:     General: No scleral icterus.       Right eye: No discharge.        Left eye: No discharge.     Extraocular Movements: Extraocular movements intact.     Conjunctiva/sclera: Conjunctivae normal.     Pupils: Pupils are equal, round, and reactive to light.  Neck:     Thyroid: No thyromegaly.     Vascular: No JVD.     Trachea: No tracheal deviation.  Cardiovascular:     Rate and Rhythm: Normal rate and regular rhythm.     Pulses: Normal pulses.     Heart sounds: Normal heart sounds. No murmur heard.    No friction rub. No gallop.  Pulmonary:     Effort: Pulmonary effort is normal. No respiratory distress.     Breath sounds: Normal breath sounds. No stridor. No wheezing or rales.  Chest:     Chest wall: No tenderness.  Abdominal:     General: Bowel sounds are normal. There is no distension.     Palpations: Abdomen is soft. There is no mass.     Tenderness: There is no abdominal tenderness. There is no guarding or rebound.  Musculoskeletal:        General: No tenderness or deformity. Normal range of motion.     Cervical back: Normal range of motion and neck supple.  Lymphadenopathy:     Cervical: No cervical adenopathy.  Skin:    General: Skin is warm and dry.     Capillary Refill: Capillary refill takes less than 2 seconds.     Coloration: Skin is not pale.     Findings: No erythema or rash.  Neurological:     Mental Status: She is alert and oriented to person, place, and time.     Cranial Nerves: No cranial  nerve deficit.     Motor: No abnormal muscle tone.     Coordination: Coordination normal.     Gait: Gait normal.     Deep Tendon Reflexes: Reflexes are normal and symmetric.  Psychiatric:        Mood and Affect: Mood  normal.        Behavior: Behavior normal.        Thought Content: Thought content normal.        Judgment: Judgment normal.        Assessment/Plan: 1. Encounter for general adult medical examination with abnormal findings Age-appropriate preventive screenings and vaccinations discussed, annual physical exam completed. Routine labs for health maintenance ordered, see below. PHM updated. Preventive screenings are up to date.   2. Kinetic tremor Amitriptyline discontinued because it can cause a kinetic tremor. Routine labs ordered. Follow up in 1 month to reevaluate for tremor, if not resolved, will do further evaluation.  - CBC with Differential/Platelet - CMP14+EGFR - Vitamin D (25 hydroxy) - TSH + free T4 - B12 and Folate Panel - Iron, TIBC and Ferritin Panel - Calcium, ionized - Phosphorus - Parathyroid hormone, intact (no Ca)  3. Type 2 diabetes mellitus with diabetic neuropathy, with long-term current use of insulin (HCC) A1c is wnl at 5.5, routine labs ordered.  - POCT HgB A1C - CMP14+EGFR - Lipid Profile - TSH + free T4  4. Iron deficiency anemia, unspecified iron deficiency anemia type Routine labs ordered, history of iron deficiency - CBC with Differential/Platelet - B12 and Folate Panel - Iron, TIBC and Ferritin Panel  5. Elevated liver enzymes Prior elevated liver enzymes, repeat labs ordered.  - CBC with Differential/Platelet - CMP14+EGFR - Lipid Profile - B12 and Folate Panel - Iron, TIBC and Ferritin Panel  6. Hypercalcemia Prior elevated calcium, repeat labs with additional labs for parathyroid.  - CMP14+EGFR - Vitamin D (25 hydroxy) - TSH + free T4 - Calcium, ionized - Phosphorus - Parathyroid hormone, intact (no Ca)  7. Gastroesophageal reflux disease without esophagitis Stable, no changes in medications, routine labs ordered.  - CBC with Differential/Platelet - CMP14+EGFR - Lipid Profile - Vitamin D (25 hydroxy) - TSH + free T4 - B12 and  Folate Panel - Iron, TIBC and Ferritin Panel - Calcium, ionized - Phosphorus - Parathyroid hormone, intact (no Ca)  8. Vitamin D deficiency Routine lab ordered.  - Vitamin D (25 hydroxy)  9. Mixed hyperlipidemia Routine lab ordered - Lipid Profile - TSH + free T4  10. Dysuria Routine urinalysis done  - UA/M w/rflx Culture, Routine  11. Overweight with body mass index (BMI) of 26 to 26.9 in adult Routine lab orders - Lipid Profile - TSH + free T4     General Counseling: Belinda Soto verbalizes understanding of the findings of todays visit and agrees with plan of treatment. I have discussed any further diagnostic evaluation that may be needed or ordered today. We also reviewed her medications today. she has been encouraged to call the office with any questions or concerns that should arise related to todays visit.    Orders Placed This Encounter  Procedures   UA/M w/rflx Culture, Routine   CBC with Differential/Platelet   CMP14+EGFR   Lipid Profile   Vitamin D (25 hydroxy)   TSH + free T4   B12 and Folate Panel   Iron, TIBC and Ferritin Panel   Calcium, ionized   Phosphorus   Parathyroid hormone, intact (no Ca)   POCT HgB A1C  No orders of the defined types were placed in this encounter.   Return in about 1 month (around 04/13/2022) for F/U, Zeniah Briney PCP -- tremor. .   Total time spent:30 Minutes Time spent includes review of chart, medications, test results, and follow up plan with the patient.   [place] Controlled Substance Database was reviewed by me.  This patient was seen by Jonetta Osgood, FNP-C in collaboration with Dr. Clayborn Bigness as a part of collaborative care agreement.  Belinda Vivero R. Valetta Fuller, MSN, FNP-C Internal medicine

## 2022-03-15 LAB — UA/M W/RFLX CULTURE, ROUTINE
Bilirubin, UA: NEGATIVE
Glucose, UA: NEGATIVE
Ketones, UA: NEGATIVE
Leukocytes,UA: NEGATIVE
Nitrite, UA: NEGATIVE
Protein,UA: NEGATIVE
RBC, UA: NEGATIVE
Specific Gravity, UA: 1.018 (ref 1.005–1.030)
Urobilinogen, Ur: 0.2 mg/dL (ref 0.2–1.0)
pH, UA: 5.5 (ref 5.0–7.5)

## 2022-03-15 LAB — MICROSCOPIC EXAMINATION
Bacteria, UA: NONE SEEN
Casts: NONE SEEN /LPF
RBC, Urine: NONE SEEN /HPF (ref 0–2)
WBC, UA: NONE SEEN /HPF (ref 0–5)

## 2022-03-16 ENCOUNTER — Ambulatory Visit (INDEPENDENT_AMBULATORY_CARE_PROVIDER_SITE_OTHER): Payer: BC Managed Care – PPO

## 2022-03-16 DIAGNOSIS — H6123 Impacted cerumen, bilateral: Secondary | ICD-10-CM | POA: Diagnosis not present

## 2022-03-16 NOTE — Progress Notes (Signed)
Did ear wash for pt, was able to remove chunks of cerumen from both ears. Pt stated she felt she could hear better and was not dizzy. She will return to office next month to see [name].

## 2022-03-24 DIAGNOSIS — E1140 Type 2 diabetes mellitus with diabetic neuropathy, unspecified: Secondary | ICD-10-CM | POA: Diagnosis not present

## 2022-03-24 DIAGNOSIS — D509 Iron deficiency anemia, unspecified: Secondary | ICD-10-CM | POA: Diagnosis not present

## 2022-03-24 DIAGNOSIS — E663 Overweight: Secondary | ICD-10-CM | POA: Diagnosis not present

## 2022-03-24 DIAGNOSIS — E559 Vitamin D deficiency, unspecified: Secondary | ICD-10-CM | POA: Diagnosis not present

## 2022-03-24 DIAGNOSIS — D529 Folate deficiency anemia, unspecified: Secondary | ICD-10-CM | POA: Diagnosis not present

## 2022-03-24 DIAGNOSIS — Z794 Long term (current) use of insulin: Secondary | ICD-10-CM | POA: Diagnosis not present

## 2022-03-24 DIAGNOSIS — R300 Dysuria: Secondary | ICD-10-CM | POA: Diagnosis not present

## 2022-03-24 DIAGNOSIS — R748 Abnormal levels of other serum enzymes: Secondary | ICD-10-CM | POA: Diagnosis not present

## 2022-03-26 LAB — PARATHYROID HORMONE, INTACT (NO CA): PTH: 21 pg/mL (ref 15–65)

## 2022-03-26 LAB — CMP14+EGFR
ALT: 13 IU/L (ref 0–32)
AST: 17 IU/L (ref 0–40)
Albumin/Globulin Ratio: 1.5 (ref 1.2–2.2)
Albumin: 4.1 g/dL (ref 3.8–4.8)
Alkaline Phosphatase: 94 IU/L (ref 44–121)
BUN/Creatinine Ratio: 23 (ref 12–28)
BUN: 21 mg/dL (ref 8–27)
Bilirubin Total: 0.2 mg/dL (ref 0.0–1.2)
CO2: 24 mmol/L (ref 20–29)
Calcium: 9.7 mg/dL (ref 8.7–10.3)
Chloride: 102 mmol/L (ref 96–106)
Creatinine, Ser: 0.9 mg/dL (ref 0.57–1.00)
Globulin, Total: 2.7 g/dL (ref 1.5–4.5)
Glucose: 52 mg/dL — ABNORMAL LOW (ref 70–99)
Potassium: 4.1 mmol/L (ref 3.5–5.2)
Sodium: 142 mmol/L (ref 134–144)
Total Protein: 6.8 g/dL (ref 6.0–8.5)
eGFR: 72 mL/min/1.73 (ref >59)

## 2022-03-26 LAB — CBC WITH DIFFERENTIAL/PLATELET
Basophils Absolute: 0 x10E3/uL (ref 0.0–0.2)
Basos: 1 %
EOS (ABSOLUTE): 0.1 x10E3/uL (ref 0.0–0.4)
Eos: 2 %
Hematocrit: 28.1 % — ABNORMAL LOW (ref 34.0–46.6)
Hemoglobin: 9.1 g/dL — ABNORMAL LOW (ref 11.1–15.9)
Immature Grans (Abs): 0 x10E3/uL (ref 0.0–0.1)
Immature Granulocytes: 0 %
Lymphocytes Absolute: 1.6 x10E3/uL (ref 0.7–3.1)
Lymphs: 24 %
MCH: 25.9 pg — ABNORMAL LOW (ref 26.6–33.0)
MCHC: 32.4 g/dL (ref 31.5–35.7)
MCV: 80 fL (ref 79–97)
Monocytes Absolute: 0.4 x10E3/uL (ref 0.1–0.9)
Monocytes: 5 %
Neutrophils Absolute: 4.7 x10E3/uL (ref 1.4–7.0)
Neutrophils: 68 %
Platelets: 260 x10E3/uL (ref 150–450)
RBC: 3.52 x10E6/uL — ABNORMAL LOW (ref 3.77–5.28)
RDW: 13.5 % (ref 11.7–15.4)
WBC: 6.8 x10E3/uL (ref 3.4–10.8)

## 2022-03-26 LAB — LIPID PANEL
Chol/HDL Ratio: 4 ratio (ref 0.0–4.4)
Cholesterol, Total: 156 mg/dL (ref 100–199)
HDL: 39 mg/dL — ABNORMAL LOW (ref >39)
LDL Chol Calc (NIH): 94 mg/dL (ref 0–99)
Triglycerides: 127 mg/dL (ref 0–149)
VLDL Cholesterol Cal: 23 mg/dL (ref 5–40)

## 2022-03-26 LAB — VITAMIN D 25 HYDROXY (VIT D DEFICIENCY, FRACTURES): Vit D, 25-Hydroxy: 92.4 ng/mL (ref 30.0–100.0)

## 2022-03-26 LAB — TSH+FREE T4
Free T4: 1.07 ng/dL (ref 0.82–1.77)
TSH: 0.727 u[IU]/mL (ref 0.450–4.500)

## 2022-03-26 LAB — IRON,TIBC AND FERRITIN PANEL
Ferritin: 9 ng/mL — ABNORMAL LOW (ref 15–150)
Iron Saturation: 9 % — CL (ref 15–55)
Iron: 36 ug/dL (ref 27–139)
Total Iron Binding Capacity: 389 ug/dL (ref 250–450)
UIBC: 353 ug/dL (ref 118–369)

## 2022-03-26 LAB — CALCIUM, IONIZED: Calcium, Ion: 5.1 mg/dL (ref 4.5–5.6)

## 2022-03-26 LAB — PHOSPHORUS: Phosphorus: 3 mg/dL (ref 3.0–4.3)

## 2022-03-26 LAB — B12 AND FOLATE PANEL
Folate: 16.1 ng/mL (ref >3.0)
Vitamin B-12: 254 pg/mL (ref 232–1245)

## 2022-03-27 NOTE — Progress Notes (Signed)
Please call patient and let her know her labs --She is significantly anemic and iron deficient.  She also has borderline low B12.  I recommend starting an iron supplement ferrous fumarate 325 mg once daily and weekly B12 injections x3 weeks and then monthly B12 injections after that.  After about 6 weeks we can repeat the labs and if the levels are still significantly low we can refer to hematology. The rest of her labs were grossly normal with a low glucose of 52 which may have been because she was fasting.  Thyroid was normal.  Calcium and parathyroid labs were also normal.

## 2022-03-29 ENCOUNTER — Telehealth: Payer: Self-pay

## 2022-03-29 ENCOUNTER — Other Ambulatory Visit: Payer: Self-pay

## 2022-03-29 MED ORDER — FERROUS FUMARATE 325 (106 FE) MG PO TABS: 1.0000 | ORAL_TABLET | Freq: Every day | ORAL | 3 refills | Status: AC

## 2022-03-29 NOTE — Telephone Encounter (Signed)
-----   Message from Jonetta Osgood, NP sent at 03/27/2022  1:20 PM EDT ----- Please call patient and let her know her labs --She is significantly anemic and iron deficient.  She also has borderline low B12.  I recommend starting an iron supplement ferrous fumarate 325 mg once daily and weekly B12 injections x3 weeks and then monthly B12 injections after that.  After about 6 weeks we can repeat the labs and if the levels are still significantly low we can refer to hematology. The rest of her labs were grossly normal with a low glucose of 52 which may have been because she was fasting.  Thyroid was normal.  Calcium and parathyroid labs were also normal.

## 2022-03-30 ENCOUNTER — Ambulatory Visit (INDEPENDENT_AMBULATORY_CARE_PROVIDER_SITE_OTHER): Payer: BC Managed Care – PPO

## 2022-03-30 DIAGNOSIS — E538 Deficiency of other specified B group vitamins: Secondary | ICD-10-CM

## 2022-03-30 MED ORDER — CYANOCOBALAMIN 1000 MCG/ML IJ SOLN
1000.0000 ug | Freq: Once | INTRAMUSCULAR | Status: AC
  Administered 2022-03-30: 1000 ug via INTRAMUSCULAR

## 2022-04-03 ENCOUNTER — Other Ambulatory Visit: Payer: Self-pay | Admitting: Nurse Practitioner

## 2022-04-03 DIAGNOSIS — E1140 Type 2 diabetes mellitus with diabetic neuropathy, unspecified: Secondary | ICD-10-CM

## 2022-04-04 ENCOUNTER — Other Ambulatory Visit: Payer: Self-pay | Admitting: Nurse Practitioner

## 2022-04-06 ENCOUNTER — Ambulatory Visit (INDEPENDENT_AMBULATORY_CARE_PROVIDER_SITE_OTHER): Payer: BC Managed Care – PPO

## 2022-04-06 DIAGNOSIS — E538 Deficiency of other specified B group vitamins: Secondary | ICD-10-CM | POA: Diagnosis not present

## 2022-04-06 MED ORDER — CYANOCOBALAMIN 1000 MCG/ML IJ SOLN
1000.0000 ug | Freq: Once | INTRAMUSCULAR | Status: AC
  Administered 2022-04-06: 1000 ug via INTRAMUSCULAR

## 2022-04-13 ENCOUNTER — Ambulatory Visit (INDEPENDENT_AMBULATORY_CARE_PROVIDER_SITE_OTHER): Payer: BC Managed Care – PPO

## 2022-04-13 DIAGNOSIS — E538 Deficiency of other specified B group vitamins: Secondary | ICD-10-CM | POA: Diagnosis not present

## 2022-04-13 MED ORDER — CYANOCOBALAMIN 1000 MCG/ML IJ SOLN
1000.0000 ug | Freq: Once | INTRAMUSCULAR | Status: AC
  Administered 2022-04-13: 1000 ug via INTRAMUSCULAR

## 2022-04-15 ENCOUNTER — Other Ambulatory Visit: Payer: Self-pay | Admitting: Internal Medicine

## 2022-04-15 ENCOUNTER — Other Ambulatory Visit: Payer: Self-pay | Admitting: Nurse Practitioner

## 2022-04-24 ENCOUNTER — Ambulatory Visit: Payer: BC Managed Care – PPO | Admitting: Nurse Practitioner

## 2022-05-01 ENCOUNTER — Encounter: Payer: Self-pay | Admitting: Nurse Practitioner

## 2022-05-01 ENCOUNTER — Ambulatory Visit (INDEPENDENT_AMBULATORY_CARE_PROVIDER_SITE_OTHER): Payer: BC Managed Care – PPO | Admitting: Nurse Practitioner

## 2022-05-01 DIAGNOSIS — Z794 Long term (current) use of insulin: Secondary | ICD-10-CM

## 2022-05-01 DIAGNOSIS — G252 Other specified forms of tremor: Secondary | ICD-10-CM | POA: Diagnosis not present

## 2022-05-01 DIAGNOSIS — D235 Other benign neoplasm of skin of trunk: Secondary | ICD-10-CM

## 2022-05-01 DIAGNOSIS — E538 Deficiency of other specified B group vitamins: Secondary | ICD-10-CM

## 2022-05-01 DIAGNOSIS — D509 Iron deficiency anemia, unspecified: Secondary | ICD-10-CM | POA: Diagnosis not present

## 2022-05-01 DIAGNOSIS — E1140 Type 2 diabetes mellitus with diabetic neuropathy, unspecified: Secondary | ICD-10-CM | POA: Diagnosis not present

## 2022-05-01 MED ORDER — ONETOUCH VERIO VI STRP: ORAL_STRIP | 12 refills | Status: AC

## 2022-05-01 MED ORDER — ONETOUCH DELICA PLUS LANCET30G MISC: 5 refills | Status: AC

## 2022-05-01 NOTE — Progress Notes (Signed)
[facility] [place], [place] [address]  Internal MEDICINE  Office Visit Note  Patient Name: Belinda Soto  [id number]  [id number]  Date of Service: 05/01/2022  Chief Complaint  Patient presents with   Follow-up    Has bump on back   Diabetes   Depression    HPI Sharena presents for a follow up visit for diabetes, depression, refills and a bump on her back.  Diabetes -- last a1c was normal at 5.5, not due to check a1c until 9/20. Has blurry vision and feels shaky when her glucose drops. she started drinking a little juice on her 10 am break at work.  Iron deficiency -- states she is going to start an iron supplement B12 deficiency --getting weekly and then monthly B12 injections  Depression -- stable, taking celexa 20 mg daily.  Bump on mid upper back -- was drained a long time ago, built back up wants it to be drained again if possible.  Tremor -- was taking amitriptyline but unsure why, tremors improved after stopping the medication    Current Medication: Outpatient Encounter Medications as of 05/01/2022  Medication Sig   amLODipine (NORVASC) 2.5 MG tablet Take 1 tablet by mouth once daily   aspirin 81 MG tablet Take 81 mg by mouth daily.   calcium carbonate (TUMS - DOSED IN MG ELEMENTAL CALCIUM) 500 MG chewable tablet Chew 1 tablet by mouth as needed for indigestion or heartburn.   citalopram (CELEXA) 20 MG tablet TAKE 1 TABLET BY MOUTH ONCE DAILY AROUND  7-8PM  FOR  DEPRESSION   conjugated estrogens (PREMARIN) vaginal cream Place 1 Applicatorful vaginally at bedtime. Daily x 2 weeks then decrease to twice weekly.   ferrous fumarate (HEMOCYTE - 106 MG FE) 325 (106 Fe) MG TABS tablet Take 1 tablet (106 mg of iron total) by mouth daily.   gabapentin (NEURONTIN) 600 MG tablet TAKE 2 TABLETS BY MOUTH THREE TIMES DAILY   glimepiride (AMARYL) 2 MG tablet Take 1 tablet (2 mg total) by mouth 2 (two) times daily.   glucose blood (ONETOUCH VERIO) test strip Use 1  test strip to check glucose with glucose meter twice daily and prn.   hydrochlorothiazide (HYDRODIURIL) 25 MG tablet TAKE 1 TABLET BY MOUTH ONCE DAILY IN THE MORNING   hydrocortisone 2.5 % ointment Apply topically.   Insulin Glargine (BASAGLAR KWIKPEN) 100 UNIT/ML Inject 32 Units into the skin at bedtime.   Insulin Pen Needle (NOVOFINE) 32G X 6 MM MISC USE WITH VICTOZA ONCE DAILY   Insulin Pen Needle 32G X 4 MM MISC Use as directed with victoza daily   Lancets (ONETOUCH DELICA PLUS [id number]) MISC Use 1 lancet to check glucose level with glucose meter twice daily and prn   losartan (COZAAR) 100 MG tablet TAKE 1 TABLET BY MOUTH IN THE MORNING   meloxicam (MOBIC) 7.5 MG tablet Take 1 tablet by mouth twice daily   metFORMIN (GLUCOPHAGE) 500 MG tablet Take 2 tablets by mouth twice daily   metoprolol tartrate (LOPRESSOR) 100 MG tablet Take 1 tablet (100 mg total) by mouth 2 (two) times daily.   pantoprazole (PROTONIX) 40 MG tablet Take 1 tablet (40 mg total) by mouth daily.   Semaglutide, 1 MG/DOSE, 4 MG/3ML SOPN Inject 1 mg as directed once a week.   VITAMIN D PO Take by mouth.   [DISCONTINUED] glucose blood (ONETOUCH ULTRA) test strip Use as instructed   No facility-administered encounter medications on file as of 05/01/2022.  Surgical History: Past Surgical History:  Procedure Laterality Date   c sections     COLONOSCOPY N/A 03/08/2015   Procedure: COLONOSCOPY;  Surgeon: Hulen Luster, MD;  Location: [facility] ENDOSCOPY;  Service: Gastroenterology;  Laterality: N/A;   COLONOSCOPY  02/2015   FISSURECTOMY N/A 08/24/2017   Procedure: FISSURECTOMY;  Surgeon: Christene Lye, MD;  Location: [facility] ORS;  Service: General;  Laterality: N/A;   FOOT SURGERY Left 2005   rotator cuff surgery Right 08/05/2021   SPHINCTEROTOMY N/A 08/24/2017   Procedure: SPHINCTEROTOMY;  Surgeon: Christene Lye, MD;  Location: [facility] ORS;  Service: General;  Laterality: N/A;    Medical History: Past Medical  History:  Diagnosis Date   Arthritis    Depression    Diabetes mellitus without complication (HCC)    Dysrhythmia    palpitations   GERD (gastroesophageal reflux disease)    TUMS PRN   Headache    H/O MIGRAINES   History of kidney stones    Hypertension    Palpitations     Family History: Family History  Problem Relation Age of Onset   Diabetes Mother    Hypertension Mother    Glaucoma Maternal Grandmother    Alzheimer's disease Maternal Grandmother    Breast cancer Neg Hx     Social History   Socioeconomic History   Marital status: Married    Spouse name: Not on file   Number of children: Not on file   Years of education: Not on file   Highest education level: Not on file  Occupational History   Not on file  Tobacco Use   Smoking status: Former    Packs/day: 0.50    Years: 12.00    Total pack years: 6.00    Types: Cigarettes    Quit date: 08/20/1985    Years since quitting: 36.7   Smokeless tobacco: Never  Vaping Use   Vaping Use: Never used  Substance and Sexual Activity   Alcohol use: Yes    Comment: rarely   Drug use: No   Sexual activity: Not on file  Other Topics Concern   Not on file  Social History Narrative   Not on file   Social Determinants of Health   Financial Resource Strain: Not on file  Food Insecurity: Not on file  Transportation Needs: Not on file  Physical Activity: Not on file  Stress: Not on file  Social Connections: Not on file  Intimate Partner Violence: Not on file      Review of Systems  Constitutional:  Negative for chills, fatigue and unexpected weight change.  HENT:  Negative for congestion, rhinorrhea, sneezing and sore throat.   Eyes:  Negative for redness.  Respiratory:  Negative for cough, chest tightness and shortness of breath.   Cardiovascular:  Negative for chest pain and palpitations.  Gastrointestinal:  Negative for abdominal pain, constipation, diarrhea, nausea and vomiting.  Genitourinary:  Negative  for dysuria and frequency.  Musculoskeletal:  Negative for arthralgias, back pain, joint swelling and neck pain.  Skin:  Negative for rash.  Neurological: Negative.  Negative for tremors and numbness.  Hematological:  Negative for adenopathy. Does not bruise/bleed easily.  Psychiatric/Behavioral:  Negative for behavioral problems (Depression), sleep disturbance and suicidal ideas. The patient is not nervous/anxious.     Vital Signs: BP 113/68   Pulse 94   Temp 98.5 F (36.9 C)   Resp 16   Ht '5\' 5"'$  (1.651 m)   Wt 154 lb (69.9 kg)  SpO2 94%   BMI 25.63 kg/m    Physical Exam Vitals reviewed.  Constitutional:      Appearance: Normal appearance. She is normal weight. She is not ill-appearing.  HENT:     Head: Normocephalic and atraumatic.  Eyes:     Pupils: Pupils are equal, round, and reactive to light.  Cardiovascular:     Rate and Rhythm: Normal rate and regular rhythm.  Pulmonary:     Effort: Pulmonary effort is normal. No respiratory distress.  Neurological:     Mental Status: She is alert and oriented to person, place, and time.  Psychiatric:        Mood and Affect: Mood normal.        Behavior: Behavior normal.        Assessment/Plan: 1. Dilated pore of Winer of back Large dilated pore of Winer, unable to remove debri in office. 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 No further intervention  3. B12 deficiency Continue with monthly B12 injections  4. Iron deficiency anemia, unspecified iron deficiency anemia type Start OTC iron supplement as discussed  5. Type 2 diabetes mellitus with diabetic neuropathy, with long-term current use of insulin (HCC) stable, A1c is normal as of June, repeat a1c in September to assess for continued stability. Strips and lancets ordered - Lancets (ONETOUCH DELICA PLUS [id number]) MISC; Use  1 lancet to check glucose level with glucose meter twice daily and prn  Dispense: 100 each; Refill: 5 - glucose blood (ONETOUCH VERIO) test strip; Use 1 test strip to check glucose with glucose meter twice daily and prn.  Dispense: 100 each; Refill: 12   General Counseling: shemeka wardle understanding of the findings of todays visit and agrees with plan of treatment. I have discussed any further diagnostic evaluation that may be needed or ordered today. We also reviewed her medications today. she has been encouraged to call the office with any questions or concerns that should arise related to todays visit.    Orders Placed This Encounter  Procedures   Ambulatory referral to Dermatology    Meds ordered this encounter  Medications   Lancets (ONETOUCH DELICA PLUS [id number]) MISC    Sig: Use 1 lancet to check glucose level with glucose meter twice daily and prn    Dispense:  100 each    Refill:  5    Dx code E11.65 Please fill today   glucose blood (ONETOUCH VERIO) test strip    Sig: Use 1 test strip to check glucose with glucose meter twice daily and prn.    Dispense:  100 each    Refill:  12    Dx code E11.65, Please fill today and discontinue prior test strip orders.    Return in about 2 months (around 07/01/2022) for F/U, Kairon Shock PCP.   Total time spent:30 Minutes Time spent includes review of chart, medications, test results, and follow up plan with the patient.   [place] Controlled Substance Database was reviewed by me.  This patient was seen by Jonetta Osgood, FNP-C in collaboration with Dr. Clayborn Bigness as a part of collaborative care agreement.   Kasten Leveque R. Valetta Fuller, MSN, FNP-C Internal medicine

## 2022-05-11 ENCOUNTER — Ambulatory Visit (INDEPENDENT_AMBULATORY_CARE_PROVIDER_SITE_OTHER): Payer: BC Managed Care – PPO

## 2022-05-11 DIAGNOSIS — E538 Deficiency of other specified B group vitamins: Secondary | ICD-10-CM | POA: Diagnosis not present

## 2022-05-15 ENCOUNTER — Ambulatory Visit (INDEPENDENT_AMBULATORY_CARE_PROVIDER_SITE_OTHER): Payer: BC Managed Care – PPO

## 2022-05-15 DIAGNOSIS — E538 Deficiency of other specified B group vitamins: Secondary | ICD-10-CM

## 2022-05-15 MED ORDER — CYANOCOBALAMIN 1000 MCG/ML IJ SOLN
1000.0000 ug | Freq: Once | INTRAMUSCULAR | Status: AC
  Administered 2022-05-15: 1000 ug via INTRAMUSCULAR

## 2022-05-23 MED ORDER — CYANOCOBALAMIN 1000 MCG/ML IJ SOLN
1000.0000 ug | Freq: Once | INTRAMUSCULAR | Status: AC
  Administered 2022-05-11: 1000 ug via INTRAMUSCULAR

## 2022-05-23 MED ORDER — CYANOCOBALAMIN 1000 MCG/ML IJ SOLN: 1000.0000 ug | Freq: Once | INTRAMUSCULAR | Status: DC

## 2022-06-05 ENCOUNTER — Other Ambulatory Visit: Payer: Self-pay | Admitting: Internal Medicine

## 2022-06-09 ENCOUNTER — Ambulatory Visit (INDEPENDENT_AMBULATORY_CARE_PROVIDER_SITE_OTHER): Payer: BC Managed Care – PPO

## 2022-06-09 DIAGNOSIS — E538 Deficiency of other specified B group vitamins: Secondary | ICD-10-CM | POA: Diagnosis not present

## 2022-06-09 MED ORDER — CYANOCOBALAMIN 1000 MCG/ML IJ SOLN
1000.0000 ug | Freq: Once | INTRAMUSCULAR | Status: AC
  Administered 2022-06-09: 1000 ug via INTRAMUSCULAR

## 2022-06-13 ENCOUNTER — Other Ambulatory Visit: Payer: Self-pay | Admitting: Internal Medicine

## 2022-06-15 ENCOUNTER — Ambulatory Visit: Payer: BC Managed Care – PPO

## 2022-06-17 ENCOUNTER — Encounter: Payer: Self-pay | Admitting: Nurse Practitioner

## 2022-06-25 ENCOUNTER — Other Ambulatory Visit: Payer: Self-pay | Admitting: Nurse Practitioner

## 2022-06-25 DIAGNOSIS — E1140 Type 2 diabetes mellitus with diabetic neuropathy, unspecified: Secondary | ICD-10-CM

## 2022-06-26 ENCOUNTER — Other Ambulatory Visit: Payer: Self-pay

## 2022-06-26 DIAGNOSIS — E1140 Type 2 diabetes mellitus with diabetic neuropathy, unspecified: Secondary | ICD-10-CM

## 2022-06-26 MED ORDER — METOPROLOL TARTRATE 100 MG PO TABS: 100.0000 mg | ORAL_TABLET | Freq: Two times a day (BID) | ORAL | 0 refills | Status: DC

## 2022-06-26 MED ORDER — GABAPENTIN 600 MG PO TABS: 1200.0000 mg | ORAL_TABLET | Freq: Three times a day (TID) | ORAL | 0 refills | Status: DC

## 2022-06-26 MED ORDER — CITALOPRAM HYDROBROMIDE 20 MG PO TABS: ORAL_TABLET | ORAL | 1 refills | Status: DC

## 2022-07-03 ENCOUNTER — Encounter: Payer: Self-pay | Admitting: Nurse Practitioner

## 2022-07-03 ENCOUNTER — Ambulatory Visit (INDEPENDENT_AMBULATORY_CARE_PROVIDER_SITE_OTHER): Payer: BC Managed Care – PPO | Admitting: Nurse Practitioner

## 2022-07-03 VITALS — BP 130/79 | HR 86 | Temp 96.0°F | Resp 16 | Ht 65.0 in | Wt 147.0 lb

## 2022-07-03 DIAGNOSIS — D509 Iron deficiency anemia, unspecified: Secondary | ICD-10-CM | POA: Diagnosis not present

## 2022-07-03 DIAGNOSIS — E538 Deficiency of other specified B group vitamins: Secondary | ICD-10-CM | POA: Diagnosis not present

## 2022-07-03 DIAGNOSIS — E1140 Type 2 diabetes mellitus with diabetic neuropathy, unspecified: Secondary | ICD-10-CM

## 2022-07-03 DIAGNOSIS — Z794 Long term (current) use of insulin: Secondary | ICD-10-CM | POA: Diagnosis not present

## 2022-07-03 DIAGNOSIS — Z760 Encounter for issue of repeat prescription: Secondary | ICD-10-CM

## 2022-07-03 LAB — POCT GLYCOSYLATED HEMOGLOBIN (HGB A1C): Hemoglobin A1C: 5.3 % (ref 4.0–5.6)

## 2022-07-03 MED ORDER — AMLODIPINE BESYLATE 2.5 MG PO TABS: 2.5000 mg | ORAL_TABLET | Freq: Every day | ORAL | 1 refills | Status: DC

## 2022-07-03 MED ORDER — METFORMIN HCL 500 MG PO TABS: 500.0000 mg | ORAL_TABLET | Freq: Two times a day (BID) | ORAL | Status: DC

## 2022-07-03 MED ORDER — LOSARTAN POTASSIUM 100 MG PO TABS: 100.0000 mg | ORAL_TABLET | Freq: Every morning | ORAL | 1 refills | Status: DC

## 2022-07-03 MED ORDER — MELOXICAM 7.5 MG PO TABS: 7.5000 mg | ORAL_TABLET | Freq: Two times a day (BID) | ORAL | 1 refills | Status: DC

## 2022-07-03 MED ORDER — HYDROCHLOROTHIAZIDE 25 MG PO TABS: 25.0000 mg | ORAL_TABLET | Freq: Every morning | ORAL | 0 refills | Status: DC

## 2022-07-03 MED ORDER — PANTOPRAZOLE SODIUM 40 MG PO TBEC: 40.0000 mg | DELAYED_RELEASE_TABLET | Freq: Every day | ORAL | 3 refills | Status: DC

## 2022-07-03 NOTE — Progress Notes (Signed)
[facility] [place], [place] [address]  Internal MEDICINE  Office Visit Note  Patient Name: Belinda Soto  [id number]  [id number]  Date of Service: 07/03/2022  Chief Complaint  Patient presents with   Follow-up   Diabetes   Gastroesophageal Reflux   Depression   Hypertension    HPI Belinda Soto presents for a follow up visit for diabetes, weight loss and hypertension A1C is 5.3 today --normal. Lost 7 more lbs, decreased appetite on ozempic, eats about half of each meal right now, dose is effective.  Hypertension -- BP well controlled with current medications.  Weight loss -- continues to lose weight with current dose of ozempic, 7 more lbs lost.     Current Medication: Outpatient Encounter Medications as of 07/03/2022  Medication Sig   aspirin 81 MG tablet Take 81 mg by mouth daily.   calcium carbonate (TUMS - DOSED IN MG ELEMENTAL CALCIUM) 500 MG chewable tablet Chew 1 tablet by mouth as needed for indigestion or heartburn.   citalopram (CELEXA) 20 MG tablet Take 1 tab po daily   conjugated estrogens (PREMARIN) vaginal cream Place 1 Applicatorful vaginally at bedtime. Daily x 2 weeks then decrease to twice weekly.   ferrous fumarate (HEMOCYTE - 106 MG FE) 325 (106 Fe) MG TABS tablet Take 1 tablet (106 mg of iron total) by mouth daily.   glimepiride (AMARYL) 2 MG tablet Take 1 tablet by mouth twice daily   glucose blood (ONETOUCH VERIO) test strip Use 1 test strip to check glucose with glucose meter twice daily and prn.   hydrocortisone 2.5 % ointment Apply topically.   Insulin Pen Needle (NOVOFINE) 32G X 6 MM MISC USE WITH VICTOZA ONCE DAILY   Insulin Pen Needle 32G X 4 MM MISC Use as directed with victoza daily   Lancets (ONETOUCH DELICA PLUS [id number]) MISC Use 1 lancet to check glucose level with glucose meter twice daily and prn   metoprolol tartrate (LOPRESSOR) 100 MG tablet Take 1 tablet (100 mg total) by mouth 2 (two) times daily.   VITAMIN D PO  Take by mouth.   [DISCONTINUED] amLODipine (NORVASC) 2.5 MG tablet Take 1 tablet by mouth once daily   [DISCONTINUED] gabapentin (NEURONTIN) 600 MG tablet Take 2 tablets (1,200 mg total) by mouth 3 (three) times daily.   [DISCONTINUED] hydrochlorothiazide (HYDRODIURIL) 25 MG tablet TAKE 1 TABLET BY MOUTH ONCE DAILY IN THE MORNING   [DISCONTINUED] Insulin Glargine (BASAGLAR KWIKPEN) 100 UNIT/ML Inject 32 Units into the skin at bedtime.   [DISCONTINUED] losartan (COZAAR) 100 MG tablet TAKE 1 TABLET BY MOUTH IN THE MORNING   [DISCONTINUED] meloxicam (MOBIC) 7.5 MG tablet Take 1 tablet by mouth twice daily   [DISCONTINUED] metFORMIN (GLUCOPHAGE) 500 MG tablet Take 2 tablets by mouth twice daily   [DISCONTINUED] pantoprazole (PROTONIX) 40 MG tablet Take 1 tablet (40 mg total) by mouth daily.   [DISCONTINUED] Semaglutide, 1 MG/DOSE, 4 MG/3ML SOPN Inject 1 mg as directed once a week.   amLODipine (NORVASC) 2.5 MG tablet Take 1 tablet (2.5 mg total) by mouth daily.   hydrochlorothiazide (HYDRODIURIL) 25 MG tablet Take 1 tablet (25 mg total) by mouth every morning.   losartan (COZAAR) 100 MG tablet Take 1 tablet (100 mg total) by mouth every morning.   meloxicam (MOBIC) 7.5 MG tablet Take 1 tablet (7.5 mg total) by mouth 2 (two) times daily.   metFORMIN (GLUCOPHAGE) 500 MG tablet Take 1 tablet (500 mg total) by mouth 2 (two) times daily.  pantoprazole (PROTONIX) 40 MG tablet Take 1 tablet (40 mg total) by mouth daily.   No facility-administered encounter medications on file as of 07/03/2022.    Surgical History: Past Surgical History:  Procedure Laterality Date   c sections     COLONOSCOPY N/A 03/08/2015   Procedure: COLONOSCOPY;  Surgeon: Hulen Luster, MD;  Location: [facility] ENDOSCOPY;  Service: Gastroenterology;  Laterality: N/A;   COLONOSCOPY  02/2015   FISSURECTOMY N/A 08/24/2017   Procedure: FISSURECTOMY;  Surgeon: Christene Lye, MD;  Location: [facility] ORS;  Service: General;  Laterality:  N/A;   FOOT SURGERY Left 2005   rotator cuff surgery Right 08/05/2021   SPHINCTEROTOMY N/A 08/24/2017   Procedure: SPHINCTEROTOMY;  Surgeon: Christene Lye, MD;  Location: [facility] ORS;  Service: General;  Laterality: N/A;    Medical History: Past Medical History:  Diagnosis Date   Arthritis    Depression    Diabetes mellitus without complication (HCC)    Dysrhythmia    palpitations   GERD (gastroesophageal reflux disease)    TUMS PRN   Headache    H/O MIGRAINES   History of kidney stones    Hypertension    Palpitations     Family History: Family History  Problem Relation Age of Onset   Diabetes Mother    Hypertension Mother    Glaucoma Maternal Grandmother    Alzheimer's disease Maternal Grandmother    Breast cancer Neg Hx     Social History   Socioeconomic History   Marital status: Married    Spouse name: Not on file   Number of children: Not on file   Years of education: Not on file   Highest education level: Not on file  Occupational History   Not on file  Tobacco Use   Smoking status: Former    Packs/day: 0.50    Years: 12.00    Total pack years: 6.00    Types: Cigarettes    Quit date: 08/20/1985    Years since quitting: 37.0   Smokeless tobacco: Never  Vaping Use   Vaping Use: Never used  Substance and Sexual Activity   Alcohol use: Yes    Comment: rarely   Drug use: No   Sexual activity: Not on file  Other Topics Concern   Not on file  Social History Narrative   Not on file   Social Determinants of Health   Financial Resource Strain: Not on file  Food Insecurity: Not on file  Transportation Needs: Not on file  Physical Activity: Not on file  Stress: Not on file  Social Connections: Not on file  Intimate Partner Violence: Not on file      Review of Systems  Constitutional:  Negative for chills, fatigue and unexpected weight change.  HENT:  Negative for congestion, rhinorrhea, sneezing and sore throat.   Eyes:  Negative for  redness.  Respiratory:  Negative for cough, chest tightness and shortness of breath.   Cardiovascular:  Negative for chest pain and palpitations.  Gastrointestinal:  Negative for abdominal pain, constipation, diarrhea, nausea and vomiting.  Genitourinary:  Negative for dysuria and frequency.  Musculoskeletal:  Negative for arthralgias, back pain, joint swelling and neck pain.  Skin:  Negative for rash.  Neurological: Negative.  Negative for tremors and numbness.  Hematological:  Negative for adenopathy. Does not bruise/bleed easily.  Psychiatric/Behavioral:  Negative for behavioral problems (Depression), sleep disturbance and suicidal ideas. The patient is not nervous/anxious.     Vital Signs: BP 130/79  Pulse 86   Temp (!) 96 F (35.6 C)   Resp 16   Ht '5\' 5"'$  (1.651 m)   Wt 147 lb (66.7 kg)   SpO2 98%   BMI 24.46 kg/m    Physical Exam Vitals reviewed.  Constitutional:      Appearance: Normal appearance. She is normal weight. She is not ill-appearing.  HENT:     Head: Normocephalic and atraumatic.  Eyes:     Pupils: Pupils are equal, round, and reactive to light.  Cardiovascular:     Rate and Rhythm: Normal rate and regular rhythm.  Pulmonary:     Effort: Pulmonary effort is normal. No respiratory distress.  Neurological:     Mental Status: She is alert and oriented to person, place, and time.  Psychiatric:        Mood and Affect: Mood normal.        Behavior: Behavior normal.        Assessment/Plan: 1. Type 2 diabetes mellitus with diabetic neuropathy, with long-term current use of insulin (HCC) A1c is normal now. Continue ozempic, will look into decreasing medications at next A1c check. Continuing to lose weight as well which is helping.  - POCT glycosylated hemoglobin (Hb A1C)  2. B12 deficiency Recheck b12 deficiency - Iron, TIBC and Ferritin Panel - B12 and Folate Panel  3. Iron deficiency anemia, unspecified iron deficiency anemia type Labs ordered to  reevaluate anemia.  - Iron, TIBC and Ferritin Panel - B12 and Folate Panel  4. Medication refill - pantoprazole (PROTONIX) 40 MG tablet; Take 1 tablet (40 mg total) by mouth daily.  Dispense: 90 tablet; Refill: 3 - meloxicam (MOBIC) 7.5 MG tablet; Take 1 tablet (7.5 mg total) by mouth 2 (two) times daily.  Dispense: 180 tablet; Refill: 1 - losartan (COZAAR) 100 MG tablet; Take 1 tablet (100 mg total) by mouth every morning.  Dispense: 90 tablet; Refill: 1 - hydrochlorothiazide (HYDRODIURIL) 25 MG tablet; Take 1 tablet (25 mg total) by mouth every morning.  Dispense: 90 tablet; Refill: 0 - amLODipine (NORVASC) 2.5 MG tablet; Take 1 tablet (2.5 mg total) by mouth daily.  Dispense: 90 tablet; Refill: 1 - metFORMIN (GLUCOPHAGE) 500 MG tablet; Take 1 tablet (500 mg total) by mouth 2 (two) times daily.   General Counseling: rogue pautler understanding of the findings of todays visit and agrees with plan of treatment. I have discussed any further diagnostic evaluation that may be needed or ordered today. We also reviewed her medications today. she has been encouraged to call the office with any questions or concerns that should arise related to todays visit.    Orders Placed This Encounter  Procedures   Iron, TIBC and Ferritin Panel   B12 and Folate Panel   POCT glycosylated hemoglobin (Hb A1C)    Meds ordered this encounter  Medications   pantoprazole (PROTONIX) 40 MG tablet    Sig: Take 1 tablet (40 mg total) by mouth daily.    Dispense:  90 tablet    Refill:  3   meloxicam (MOBIC) 7.5 MG tablet    Sig: Take 1 tablet (7.5 mg total) by mouth 2 (two) times daily.    Dispense:  180 tablet    Refill:  1   losartan (COZAAR) 100 MG tablet    Sig: Take 1 tablet (100 mg total) by mouth every morning.    Dispense:  90 tablet    Refill:  1   hydrochlorothiazide (HYDRODIURIL) 25 MG tablet    Sig:  Take 1 tablet (25 mg total) by mouth every morning.    Dispense:  90 tablet    Refill:  0    amLODipine (NORVASC) 2.5 MG tablet    Sig: Take 1 tablet (2.5 mg total) by mouth daily.    Dispense:  90 tablet    Refill:  1   metFORMIN (GLUCOPHAGE) 500 MG tablet    Sig: Take 1 tablet (500 mg total) by mouth 2 (two) times daily.    Return in about 3 months (around 10/03/2022) for F/U, Recheck A1C, Anabelle Bungert PCP.   Total time spent:30 Minutes Time spent includes review of chart, medications, test results, and follow up plan with the patient.   [place] Controlled Substance Database was reviewed by me.  This patient was seen by Jonetta Osgood, FNP-C in collaboration with Dr. Clayborn Bigness as a part of collaborative care agreement.   Marguerite Jarboe R. Valetta Fuller, MSN, FNP-C Internal medicine

## 2022-07-05 DIAGNOSIS — D509 Iron deficiency anemia, unspecified: Secondary | ICD-10-CM | POA: Diagnosis not present

## 2022-07-05 DIAGNOSIS — E538 Deficiency of other specified B group vitamins: Secondary | ICD-10-CM | POA: Diagnosis not present

## 2022-07-06 LAB — B12 AND FOLATE PANEL
Folate: 17.7 ng/mL (ref >3.0)
Vitamin B-12: 851 pg/mL (ref 232–1245)

## 2022-07-06 LAB — IRON,TIBC AND FERRITIN PANEL
Ferritin: 12 ng/mL — ABNORMAL LOW (ref 15–150)
Iron Saturation: 7 % — CL (ref 15–55)
Iron: 28 ug/dL (ref 27–139)
Total Iron Binding Capacity: 396 ug/dL (ref 250–450)
UIBC: 368 ug/dL (ref 118–369)

## 2022-07-13 ENCOUNTER — Ambulatory Visit (INDEPENDENT_AMBULATORY_CARE_PROVIDER_SITE_OTHER): Payer: BC Managed Care – PPO

## 2022-07-13 DIAGNOSIS — E538 Deficiency of other specified B group vitamins: Secondary | ICD-10-CM | POA: Diagnosis not present

## 2022-07-13 MED ORDER — CYANOCOBALAMIN 1000 MCG/ML IJ SOLN
1000.0000 ug | Freq: Once | INTRAMUSCULAR | Status: AC
  Administered 2022-07-13: 1000 ug via INTRAMUSCULAR

## 2022-07-20 ENCOUNTER — Other Ambulatory Visit: Payer: Self-pay | Admitting: Nurse Practitioner

## 2022-07-20 DIAGNOSIS — E1140 Type 2 diabetes mellitus with diabetic neuropathy, unspecified: Secondary | ICD-10-CM

## 2022-07-20 MED ORDER — GABAPENTIN 600 MG PO TABS: 1200.0000 mg | ORAL_TABLET | Freq: Three times a day (TID) | ORAL | 2 refills | Status: DC

## 2022-08-07 ENCOUNTER — Other Ambulatory Visit: Payer: Self-pay | Admitting: Nurse Practitioner

## 2022-08-10 ENCOUNTER — Ambulatory Visit: Payer: BC Managed Care – PPO

## 2022-08-11 ENCOUNTER — Other Ambulatory Visit: Payer: Self-pay | Admitting: Nurse Practitioner

## 2022-08-12 ENCOUNTER — Encounter: Payer: Self-pay | Admitting: Nurse Practitioner

## 2022-09-14 ENCOUNTER — Ambulatory Visit: Payer: BC Managed Care – PPO

## 2022-09-21 ENCOUNTER — Other Ambulatory Visit: Payer: Self-pay | Admitting: Nurse Practitioner

## 2022-09-28 ENCOUNTER — Ambulatory Visit (INDEPENDENT_AMBULATORY_CARE_PROVIDER_SITE_OTHER): Payer: BC Managed Care – PPO

## 2022-09-28 DIAGNOSIS — E538 Deficiency of other specified B group vitamins: Secondary | ICD-10-CM

## 2022-09-28 MED ORDER — CYANOCOBALAMIN 1000 MCG/ML IJ SOLN
1000.0000 ug | Freq: Once | INTRAMUSCULAR | Status: AC
  Administered 2022-09-28: 1000 ug via INTRAMUSCULAR

## 2022-09-29 ENCOUNTER — Other Ambulatory Visit: Payer: Self-pay | Admitting: Nurse Practitioner

## 2022-09-29 DIAGNOSIS — Z760 Encounter for issue of repeat prescription: Secondary | ICD-10-CM

## 2022-10-05 ENCOUNTER — Ambulatory Visit: Payer: BC Managed Care – PPO | Admitting: Nurse Practitioner

## 2022-10-12 ENCOUNTER — Ambulatory Visit: Payer: BC Managed Care – PPO

## 2022-10-12 ENCOUNTER — Ambulatory Visit: Payer: BC Managed Care – PPO | Admitting: Nurse Practitioner

## 2022-10-12 ENCOUNTER — Encounter: Payer: Self-pay | Admitting: Nurse Practitioner

## 2022-10-12 VITALS — BP 110/55 | HR 79 | Temp 97.8°F | Resp 16 | Ht 65.0 in | Wt 153.0 lb

## 2022-10-12 DIAGNOSIS — E1140 Type 2 diabetes mellitus with diabetic neuropathy, unspecified: Secondary | ICD-10-CM

## 2022-10-12 DIAGNOSIS — E538 Deficiency of other specified B group vitamins: Secondary | ICD-10-CM | POA: Diagnosis not present

## 2022-10-12 DIAGNOSIS — I10 Essential (primary) hypertension: Secondary | ICD-10-CM

## 2022-10-12 DIAGNOSIS — D509 Iron deficiency anemia, unspecified: Secondary | ICD-10-CM | POA: Diagnosis not present

## 2022-10-12 LAB — POCT GLYCOSYLATED HEMOGLOBIN (HGB A1C): Hemoglobin A1C: 5.5 % (ref 4.0–5.6)

## 2022-10-12 MED ORDER — CYANOCOBALAMIN 1000 MCG/ML IJ SOLN
1000.0000 ug | Freq: Once | INTRAMUSCULAR | Status: AC
  Administered 2022-10-12: 1000 ug via INTRAMUSCULAR

## 2022-10-12 NOTE — Progress Notes (Signed)
[facility] [place], [place] [address]  Internal MEDICINE  Office Visit Note  Patient Name: Belinda Soto  [id number]  [id number]  Date of Service: 10/12/2022  Chief Complaint  Patient presents with   Follow-up   Depression   Diabetes   Gastroesophageal Reflux   Hypertension    HPI [name] presents for a follow-up visit for diabetes, hypertension and low iron.  Iron deficiency -- has been taking OTC iron supplements, last levels 3 months ago were still low, need to recheck levels.  Diabetes-- A1c is still normal at 5.5, stable, doing great! Blood pressure is ok.     Current Medication: Outpatient Encounter Medications as of 10/12/2022  Medication Sig   amLODipine (NORVASC) 2.5 MG tablet Take 1 tablet (2.5 mg total) by mouth daily.   aspirin 81 MG tablet Take 81 mg by mouth daily.   calcium carbonate (TUMS - DOSED IN MG ELEMENTAL CALCIUM) 500 MG chewable tablet Chew 1 tablet by mouth as needed for indigestion or heartburn.   citalopram (CELEXA) 20 MG tablet Take 1 tab po daily   conjugated estrogens (PREMARIN) vaginal cream Place 1 Applicatorful vaginally at bedtime. Daily x 2 weeks then decrease to twice weekly.   ferrous fumarate (HEMOCYTE - 106 MG FE) 325 (106 Fe) MG TABS tablet Take 1 tablet (106 mg of iron total) by mouth daily.   gabapentin (NEURONTIN) 600 MG tablet Take 2 tablets (1,200 mg total) by mouth 3 (three) times daily.   glimepiride (AMARYL) 2 MG tablet Take 1 tablet by mouth twice daily   glucose blood (ONETOUCH VERIO) test strip Use 1 test strip to check glucose with glucose meter twice daily and prn.   hydrochlorothiazide (HYDRODIURIL) 25 MG tablet TAKE 1 TABLET BY MOUTH ONCE DAILY IN THE MORNING   hydrocortisone 2.5 % ointment Apply topically.   Insulin Glargine (BASAGLAR KWIKPEN) 100 UNIT/ML INJECT 32 UNITS SUBCUTANEOUSLY AT BEDTIME   Insulin Pen Needle (NOVOFINE) 32G X 6 MM MISC USE WITH VICTOZA ONCE DAILY   Insulin Pen Needle 32G  X 4 MM MISC Use as directed with victoza daily   Lancets (ONETOUCH DELICA PLUS [id number]) MISC Use 1 lancet to check glucose level with glucose meter twice daily and prn   losartan (COZAAR) 100 MG tablet Take 1 tablet (100 mg total) by mouth every morning.   meloxicam (MOBIC) 7.5 MG tablet Take 1 tablet (7.5 mg total) by mouth 2 (two) times daily.   metFORMIN (GLUCOPHAGE) 500 MG tablet Take 1 tablet (500 mg total) by mouth 2 (two) times daily.   metoprolol tartrate (LOPRESSOR) 100 MG tablet Take 1 tablet (100 mg total) by mouth 2 (two) times daily.   pantoprazole (PROTONIX) 40 MG tablet Take 1 tablet (40 mg total) by mouth daily.   Semaglutide, 1 MG/DOSE, (OZEMPIC, 1 MG/DOSE,) 4 MG/3ML SOPN INJECT 1 DOSE (1 MG) SUBCUTANEOUSLY ONCE A WEEK AS DIRECTED   VITAMIN D PO Take by mouth.   [EXPIRED] cyanocobalamin (VITAMIN B12) injection 1,000 mcg    No facility-administered encounter medications on file as of 10/12/2022.    Surgical History: Past Surgical History:  Procedure Laterality Date   c sections     COLONOSCOPY N/A 03/08/2015   Procedure: COLONOSCOPY;  Surgeon: Hulen Luster, MD;  Location: [facility] ENDOSCOPY;  Service: Gastroenterology;  Laterality: N/A;   COLONOSCOPY  02/2015   FISSURECTOMY N/A 08/24/2017   Procedure: FISSURECTOMY;  Surgeon: Christene Lye, MD;  Location: [facility] ORS;  Service: General;  Laterality:  N/A;   FOOT SURGERY Left 2005   rotator cuff surgery Right 08/05/2021   SPHINCTEROTOMY N/A 08/24/2017   Procedure: SPHINCTEROTOMY;  Surgeon: Christene Lye, MD;  Location: [facility] ORS;  Service: General;  Laterality: N/A;    Medical History: Past Medical History:  Diagnosis Date   Arthritis    Depression    Diabetes mellitus without complication (HCC)    Dysrhythmia    palpitations   GERD (gastroesophageal reflux disease)    TUMS PRN   Headache    H/O MIGRAINES   History of kidney stones    Hypertension    Palpitations     Family History: Family History   Problem Relation Age of Onset   Diabetes Mother    Hypertension Mother    Glaucoma Maternal Grandmother    Alzheimer's disease Maternal Grandmother    Breast cancer Neg Hx     Social History   Socioeconomic History   Marital status: Married    Spouse name: Not on file   Number of children: Not on file   Years of education: Not on file   Highest education level: Not on file  Occupational History   Not on file  Tobacco Use   Smoking status: Former    Packs/day: 0.50    Years: 12.00    Total pack years: 6.00    Types: Cigarettes    Quit date: 08/20/1985    Years since quitting: 37.1   Smokeless tobacco: Never  Vaping Use   Vaping Use: Never used  Substance and Sexual Activity   Alcohol use: Yes    Comment: rarely   Drug use: No   Sexual activity: Not on file  Other Topics Concern   Not on file  Social History Narrative   Not on file   Social Determinants of Health   Financial Resource Strain: Not on file  Food Insecurity: Not on file  Transportation Needs: Not on file  Physical Activity: Not on file  Stress: Not on file  Social Connections: Not on file  Intimate Partner Violence: Not on file      Review of Systems  Constitutional:  Negative for chills, fatigue and unexpected weight change.  HENT:  Negative for congestion, rhinorrhea, sneezing and sore throat.   Eyes:  Negative for redness.  Respiratory:  Negative for cough, chest tightness and shortness of breath.   Cardiovascular:  Negative for chest pain and palpitations.  Gastrointestinal:  Negative for abdominal pain, constipation, diarrhea, nausea and vomiting.  Genitourinary:  Negative for dysuria and frequency.  Musculoskeletal:  Negative for arthralgias, back pain, joint swelling and neck pain.  Skin:  Negative for rash.  Neurological: Negative.  Negative for tremors and numbness.  Hematological:  Negative for adenopathy. Does not bruise/bleed easily.  Psychiatric/Behavioral:  Negative for  behavioral problems (Depression), sleep disturbance and suicidal ideas. The patient is not nervous/anxious.     Vital Signs: BP (!) 110/55   Pulse 79   Temp 97.8 F (36.6 C)   Resp 16   Ht '5\' 5"'$  (1.651 m)   Wt 153 lb (69.4 kg)   SpO2 98%   BMI 25.46 kg/m    Physical Exam Vitals reviewed.  Constitutional:      Appearance: Normal appearance. She is normal weight. She is not ill-appearing.  HENT:     Head: Normocephalic and atraumatic.  Eyes:     Pupils: Pupils are equal, round, and reactive to light.  Cardiovascular:     Rate and Rhythm: Normal  rate and regular rhythm.  Pulmonary:     Effort: Pulmonary effort is normal. No respiratory distress.  Neurological:     Mental Status: She is alert and oriented to person, place, and time.  Psychiatric:        Mood and Affect: Mood normal.        Behavior: Behavior normal.        Assessment/Plan: 1. Type 2 diabetes mellitus with diabetic neuropathy, without long-term current use of insulin (HCC) A1c is normal, patient is stable, continue medications as prescribed.  - POCT glycosylated hemoglobin (Hb A1C)  2. Iron deficiency anemia, unspecified iron deficiency anemia type Abs ordered to recheck iron levels, CBC and B12 - Iron, TIBC and Ferritin Panel - B12 and Folate Panel - CBC with Differential/Platelet  3. B12 deficiency B12 injection administered - B12 and Folate Panel - cyanocobalamin (VITAMIN B12) injection 1,000 mcg  4. Essential hypertension Stable, continue medications as prescribed   General Counseling: anitria andon understanding of the findings of todays visit and agrees with plan of treatment. I have discussed any further diagnostic evaluation that may be needed or ordered today. We also reviewed her medications today. she has been encouraged to call the office with any questions or concerns that should arise related to todays visit.    Orders Placed This Encounter  Procedures   Iron, TIBC and  Ferritin Panel   B12 and Folate Panel   CBC with Differential/Platelet   POCT glycosylated hemoglobin (Hb A1C)    Meds ordered this encounter  Medications   cyanocobalamin (VITAMIN B12) injection 1,000 mcg    Return in about 3 months (around 01/11/2023) for F/U, Recheck A1C, Marton Malizia PCP.   Total time spent:30 Minutes Time spent includes review of chart, medications, test results, and follow up plan with the patient.   [place] Controlled Substance Database was reviewed by me.  This patient was seen by Jonetta Osgood, FNP-C in collaboration with Dr. Clayborn Bigness as a part of collaborative care agreement.   Pippa Hanif R. Valetta Fuller, MSN, FNP-C Internal medicine

## 2022-10-15 ENCOUNTER — Encounter: Payer: Self-pay | Admitting: Nurse Practitioner

## 2022-10-25 DIAGNOSIS — E538 Deficiency of other specified B group vitamins: Secondary | ICD-10-CM | POA: Diagnosis not present

## 2022-10-25 DIAGNOSIS — D509 Iron deficiency anemia, unspecified: Secondary | ICD-10-CM | POA: Diagnosis not present

## 2022-10-26 LAB — CBC WITH DIFFERENTIAL/PLATELET
Basophils Absolute: 0 x10E3/uL (ref 0.0–0.2)
Basos: 0 %
EOS (ABSOLUTE): 0 x10E3/uL (ref 0.0–0.4)
Eos: 1 %
Hematocrit: 35.2 % (ref 34.0–46.6)
Hemoglobin: 11.6 g/dL (ref 11.1–15.9)
Immature Grans (Abs): 0 x10E3/uL (ref 0.0–0.1)
Immature Granulocytes: 0 %
Lymphocytes Absolute: 1.3 x10E3/uL (ref 0.7–3.1)
Lymphs: 15 %
MCH: 27.8 pg (ref 26.6–33.0)
MCHC: 33 g/dL (ref 31.5–35.7)
MCV: 84 fL (ref 79–97)
Monocytes Absolute: 0.4 x10E3/uL (ref 0.1–0.9)
Monocytes: 5 %
Neutrophils Absolute: 6.7 x10E3/uL (ref 1.4–7.0)
Neutrophils: 79 %
Platelets: 223 x10E3/uL (ref 150–450)
RBC: 4.18 x10E6/uL (ref 3.77–5.28)
RDW: 12.8 % (ref 11.7–15.4)
WBC: 8.4 x10E3/uL (ref 3.4–10.8)

## 2022-10-26 LAB — IRON,TIBC AND FERRITIN PANEL
Ferritin: 19 ng/mL (ref 15–150)
Iron Saturation: 12 % — ABNORMAL LOW (ref 15–55)
Iron: 43 ug/dL (ref 27–139)
Total Iron Binding Capacity: 361 ug/dL (ref 250–450)
UIBC: 318 ug/dL (ref 118–369)

## 2022-10-26 LAB — B12 AND FOLATE PANEL
Folate: 10.7 ng/mL (ref >3.0)
Vitamin B-12: >2000 pg/mL — ABNORMAL HIGH (ref 232–1245)

## 2022-10-31 ENCOUNTER — Telehealth: Payer: Self-pay

## 2022-10-31 NOTE — Telephone Encounter (Signed)
Pt notified for labs  

## 2022-10-31 NOTE — Telephone Encounter (Signed)
-----   Message from Jonetta Osgood, NP sent at 10/31/2022  8:31 AM EST ----- Please let patient know about her lab results -- her iron panel has improved but her ferritin is low normal --B12 and folate are normal -- CBC has returned to normal  -- no anemia  I think she should continue taking an iron supplement but it can be OTC ferrous sulfate or ferrous fumarate.

## 2022-10-31 NOTE — Progress Notes (Signed)
Please let patient know about her lab results -- her iron panel has improved but her ferritin is low normal --B12 and folate are normal -- CBC has returned to normal  -- no anemia  I think she should continue taking an iron supplement but it can be OTC ferrous sulfate or ferrous fumarate.

## 2022-10-31 NOTE — Telephone Encounter (Signed)
Done pt notified for labs

## 2022-11-06 ENCOUNTER — Other Ambulatory Visit: Payer: Self-pay | Admitting: Nurse Practitioner

## 2022-11-16 ENCOUNTER — Ambulatory Visit: Payer: BC Managed Care – PPO | Admitting: Dermatology

## 2022-11-21 ENCOUNTER — Other Ambulatory Visit: Payer: Self-pay | Admitting: Internal Medicine

## 2022-11-21 ENCOUNTER — Other Ambulatory Visit: Payer: Self-pay | Admitting: Nurse Practitioner

## 2022-11-21 DIAGNOSIS — Z1231 Encounter for screening mammogram for malignant neoplasm of breast: Secondary | ICD-10-CM

## 2022-11-21 DIAGNOSIS — E1140 Type 2 diabetes mellitus with diabetic neuropathy, unspecified: Secondary | ICD-10-CM

## 2022-11-28 ENCOUNTER — Other Ambulatory Visit: Payer: Self-pay | Admitting: Nurse Practitioner

## 2022-11-29 ENCOUNTER — Other Ambulatory Visit: Payer: Self-pay | Admitting: Nurse Practitioner

## 2022-11-29 DIAGNOSIS — E1140 Type 2 diabetes mellitus with diabetic neuropathy, unspecified: Secondary | ICD-10-CM

## 2022-12-05 ENCOUNTER — Other Ambulatory Visit: Payer: Self-pay | Admitting: Nurse Practitioner

## 2022-12-06 ENCOUNTER — Other Ambulatory Visit: Payer: Self-pay | Admitting: Nurse Practitioner

## 2022-12-25 ENCOUNTER — Other Ambulatory Visit: Payer: Self-pay | Admitting: Nurse Practitioner

## 2022-12-25 DIAGNOSIS — Z760 Encounter for issue of repeat prescription: Secondary | ICD-10-CM

## 2022-12-26 ENCOUNTER — Other Ambulatory Visit: Payer: Self-pay | Admitting: Nurse Practitioner

## 2023-01-15 ENCOUNTER — Ambulatory Visit: Payer: BC Managed Care – PPO | Admitting: Nurse Practitioner

## 2023-01-16 ENCOUNTER — Ambulatory Visit
Admission: RE | Admit: 2023-01-16 | Discharge: 2023-01-16 | Disposition: A | Discharge status: Home / Self Care | Payer: BC Managed Care – PPO | Source: Ambulatory Visit | Attending: Internal Medicine | Admitting: Internal Medicine

## 2023-01-16 DIAGNOSIS — Z1231 Encounter for screening mammogram for malignant neoplasm of breast: Secondary | ICD-10-CM

## 2023-01-17 ENCOUNTER — Other Ambulatory Visit: Payer: Self-pay | Admitting: Nurse Practitioner

## 2023-01-19 ENCOUNTER — Telehealth: Payer: Self-pay

## 2023-01-19 NOTE — Telephone Encounter (Signed)
Patient called stating last week she was at her daughters house and her sugar dropped and they had to call ems and her sugar was down in the 40s and she think it drop this morning and she it drops she wet the bed. Per [name] she is to hold the glimepiride til she is seen here on Monday.

## 2023-01-22 ENCOUNTER — Ambulatory Visit: Payer: BC Managed Care – PPO | Admitting: Nurse Practitioner

## 2023-01-22 ENCOUNTER — Encounter: Payer: Self-pay | Admitting: Nurse Practitioner

## 2023-01-22 VITALS — BP 141/87 | HR 78 | Temp 98.4°F | Resp 16 | Ht 65.0 in | Wt 155.2 lb

## 2023-01-22 DIAGNOSIS — N952 Postmenopausal atrophic vaginitis: Secondary | ICD-10-CM | POA: Diagnosis not present

## 2023-01-22 DIAGNOSIS — R2681 Unsteadiness on feet: Secondary | ICD-10-CM

## 2023-01-22 DIAGNOSIS — E1140 Type 2 diabetes mellitus with diabetic neuropathy, unspecified: Secondary | ICD-10-CM | POA: Diagnosis not present

## 2023-01-22 DIAGNOSIS — E11649 Type 2 diabetes mellitus with hypoglycemia without coma: Secondary | ICD-10-CM

## 2023-01-22 DIAGNOSIS — Z9181 History of falling: Secondary | ICD-10-CM

## 2023-01-22 DIAGNOSIS — I10 Essential (primary) hypertension: Secondary | ICD-10-CM

## 2023-01-22 DIAGNOSIS — Z760 Encounter for issue of repeat prescription: Secondary | ICD-10-CM

## 2023-01-22 DIAGNOSIS — R300 Dysuria: Secondary | ICD-10-CM | POA: Diagnosis not present

## 2023-01-22 LAB — POCT GLYCOSYLATED HEMOGLOBIN (HGB A1C): Hemoglobin A1C: 5.2 % (ref 4.0–5.6)

## 2023-01-22 MED ORDER — ESTROGENS CONJUGATED 0.625 MG/GM VA CREA: 1.0000 | TOPICAL_CREAM | Freq: Every day | VAGINAL | 12 refills | Status: DC

## 2023-01-22 MED ORDER — BASAGLAR KWIKPEN 100 UNIT/ML ~~LOC~~ SOPN: 16.0000 [IU] | PEN_INJECTOR | Freq: Every day | SUBCUTANEOUS | 0 refills | Status: DC

## 2023-01-22 MED ORDER — HYDROCHLOROTHIAZIDE 25 MG PO TABS: 25.0000 mg | ORAL_TABLET | Freq: Every morning | ORAL | 0 refills | Status: DC

## 2023-01-22 MED ORDER — GABAPENTIN 600 MG PO TABS: 1200.0000 mg | ORAL_TABLET | Freq: Three times a day (TID) | ORAL | 3 refills | Status: DC

## 2023-01-22 NOTE — Progress Notes (Signed)
[facility] [address] [place], [place] [address]  Internal MEDICINE  Office Visit Note  Patient Name: Belinda Soto  [id number]  [id number]  Date of Service: 01/22/2023  Chief Complaint  Patient presents with   Depression   Gastroesophageal Reflux   Diabetes   Hypertension    HPI Belinda Soto presents for a follow-up visit for hypoglycemia episodes, urinary incontinence and recent falls.  Hypoglycemia episodes -- A1c is normal at 5.2. previously, patient has not had any low glucose readings until recently so her medications had not been changed or adjusted yet. She called the clinic about her glucose dropping to 40 and I wanted to see her sooner in the office because of this. As of her visit today, she is taking glimepiride 2 mg, metformin 500 mg and basaglar insulin 32 units daily. The first time her glucose dropped to 40, she did not wake up, was incontinent of urine and was confused when she did wake up. Her glucose dropped 1 other time and her symptoms were similar except she was not asleep.  Urinary incontinence -- she has also had some episodes of urge and stress incontinence related and not related to her low glucose levels.  Recent falls -- has fallen 4-5 times, no injuries, but easily stumbling and having unsteady gait and lightheadedness. These episodes may have been related to slightly low glucose levels at the time. At least 1 fall happened during a hypoglycemic episode.        Current Medication: Outpatient Encounter Medications as of 01/22/2023  Medication Sig   amLODipine (NORVASC) 2.5 MG tablet Take 1 tablet (2.5 mg total) by mouth daily.   aspirin 81 MG tablet Take 81 mg by mouth daily.   calcium carbonate (TUMS - DOSED IN MG ELEMENTAL CALCIUM) 500 MG chewable tablet Chew 1 tablet by mouth as needed for indigestion or heartburn.   citalopram (CELEXA) 20 MG tablet Take 1 tab po daily   conjugated estrogens (PREMARIN) vaginal cream Place 1 Applicatorful  vaginally at bedtime. X2 weeks then decrease to twice weekly.   ferrous fumarate (HEMOCYTE - 106 MG FE) 325 (106 Fe) MG TABS tablet Take 1 tablet (106 mg of iron total) by mouth daily.   glucose blood (ONETOUCH VERIO) test strip Use 1 test strip to check glucose with glucose meter twice daily and prn.   hydrocortisone 2.5 % ointment Apply topically.   Insulin Pen Needle (NOVOFINE) 32G X 6 MM MISC USE WITH VICTOZA ONCE DAILY   Insulin Pen Needle 32G X 4 MM MISC Use as directed with victoza daily   Lancets (ONETOUCH DELICA PLUS LANCET30G) MISC Use 1 lancet to check glucose level with glucose meter twice daily and prn   losartan (COZAAR) 100 MG tablet Take 1 tablet (100 mg total) by mouth every morning.   meloxicam (MOBIC) 7.5 MG tablet Take 1 tablet (7.5 mg total) by mouth 2 (two) times daily.   metFORMIN (GLUCOPHAGE) 500 MG tablet Take 1 tablet (500 mg total) by mouth 2 (two) times daily.   metoprolol tartrate (LOPRESSOR) 100 MG tablet Take 1 tablet by mouth twice daily   pantoprazole (PROTONIX) 40 MG tablet Take 1 tablet (40 mg total) by mouth daily.   Semaglutide, 1 MG/DOSE, (OZEMPIC, 1 MG/DOSE,) 4 MG/3ML SOPN INJECT 1MG  SUBCUTANEOUSLY ONCE WEEKLY AS DIRECTED   VITAMIN D PO Take by mouth.   [DISCONTINUED] conjugated estrogens (PREMARIN) vaginal cream Place 1 Applicatorful vaginally at bedtime. Daily x 2 weeks then decrease to twice weekly.   [  DISCONTINUED] gabapentin (NEURONTIN) 600 MG tablet TAKE 2 TABLETS BY MOUTH THREE TIMES DAILY   [DISCONTINUED] glimepiride (AMARYL) 2 MG tablet Take 1 tablet by mouth twice daily   [DISCONTINUED] hydrochlorothiazide (HYDRODIURIL) 25 MG tablet TAKE 1 TABLET BY MOUTH ONCE DAILY IN THE MORNING   [DISCONTINUED] Insulin Glargine (BASAGLAR KWIKPEN) 100 UNIT/ML INJECT 32 UNITS SUBCUTANEOUSLY AT BEDTIME   gabapentin (NEURONTIN) 600 MG tablet Take 2 tablets (1,200 mg total) by mouth 3 (three) times daily.   hydrochlorothiazide (HYDRODIURIL) 25 MG tablet Take 1 tablet  (25 mg total) by mouth every morning.   Insulin Glargine (BASAGLAR KWIKPEN) 100 UNIT/ML Inject 16 Units into the skin at bedtime.   No facility-administered encounter medications on file as of 01/22/2023.    Surgical History: Past Surgical History:  Procedure Laterality Date   c sections     COLONOSCOPY N/A 03/08/2015   Procedure: COLONOSCOPY;  Surgeon: Wallace Cullens, MD;  Location: [facility] ENDOSCOPY;  Service: Gastroenterology;  Laterality: N/A;   COLONOSCOPY  02/2015   FISSURECTOMY N/A 08/24/2017   Procedure: FISSURECTOMY;  Surgeon: Kieth Brightly, MD;  Location: [facility] ORS;  Service: General;  Laterality: N/A;   FOOT SURGERY Left 2005   rotator cuff surgery Right 08/05/2021   SPHINCTEROTOMY N/A 08/24/2017   Procedure: SPHINCTEROTOMY;  Surgeon: Kieth Brightly, MD;  Location: [facility] ORS;  Service: General;  Laterality: N/A;    Medical History: Past Medical History:  Diagnosis Date   Arthritis    Depression    Diabetes mellitus without complication (HCC)    Dysrhythmia    palpitations   GERD (gastroesophageal reflux disease)    TUMS PRN   Headache    H/O MIGRAINES   History of kidney stones    Hypertension    Palpitations     Family History: Family History  Problem Relation Age of Onset   Diabetes Mother    Hypertension Mother    Glaucoma Maternal Grandmother    Alzheimer's disease Maternal Grandmother    Breast cancer Neg Hx     Social History   Socioeconomic History   Marital status: Married    Spouse name: Not on file   Number of children: Not on file   Years of education: Not on file   Highest education level: Not on file  Occupational History   Not on file  Tobacco Use   Smoking status: Former    Packs/day: 0.50    Years: 12.00    Additional pack years: 0.00    Total pack years: 6.00    Types: Cigarettes    Quit date: 08/20/1985    Years since quitting: 37.4   Smokeless tobacco: Never  Vaping Use   Vaping Use: Never used  Substance and  Sexual Activity   Alcohol use: Yes    Comment: rarely   Drug use: No   Sexual activity: Not on file  Other Topics Concern   Not on file  Social History Narrative   Not on file   Social Determinants of Health   Financial Resource Strain: Not on file  Food Insecurity: Not on file  Transportation Needs: Not on file  Physical Activity: Not on file  Stress: Not on file  Social Connections: Not on file  Intimate Partner Violence: Not on file      Review of Systems  Constitutional:  Positive for fatigue. Negative for chills and unexpected weight change.  HENT:  Negative for congestion, rhinorrhea, sneezing and sore throat.   Eyes:  Negative for  redness.  Respiratory: Negative.  Negative for cough, chest tightness, shortness of breath and wheezing.   Cardiovascular: Negative.  Negative for chest pain and palpitations.  Gastrointestinal: Negative.  Negative for abdominal pain, constipation, diarrhea, nausea and vomiting.  Genitourinary:  Negative for dysuria and frequency.  Musculoskeletal:  Negative for arthralgias, back pain, joint swelling and neck pain.  Skin:  Negative for rash.  Neurological:  Positive for weakness. Negative for tremors and numbness.  Hematological:  Negative for adenopathy. Does not bruise/bleed easily.  Psychiatric/Behavioral:  Negative for behavioral problems (Depression), sleep disturbance and suicidal ideas. The patient is not nervous/anxious.     Vital Signs: BP (!) 141/87   Pulse 78   Temp 98.4 F (36.9 C)   Resp 16   Ht 5\' 5"  (1.651 m)   Wt 155 lb 3.2 oz (70.4 kg)   SpO2 95%   BMI 25.83 kg/m    Physical Exam Vitals reviewed.  HENT:     Head: Normocephalic and atraumatic.  Eyes:     Pupils: Pupils are equal, round, and reactive to light.  Cardiovascular:     Rate and Rhythm: Normal rate.  Neurological:     Mental Status: She is alert and oriented to person, place, and time.  Psychiatric:        Mood and Affect: Mood normal.         Behavior: Behavior normal.        Assessment/Plan: 1. Hypoglycemia associated with diabetes (HCC) Stop glimepiride and metformin Decrease basaglar dose to 16 units daily.  If blood glucose level starts going too high, may restart metformin once daily.   2. Type 2 diabetes mellitus with diabetic neuropathy, without long-term current use of insulin (HCC) A1c remains normal.  Discontinue glimepiride and metformin Decrease basaglar dose to 16 units daily - POCT glycosylated hemoglobin (Hb A1C) - Insulin Glargine (BASAGLAR KWIKPEN) 100 UNIT/ML; Inject 16 Units into the skin at bedtime.  Dispense: 15 mL; Refill: 0 - gabapentin (NEURONTIN) 600 MG tablet; Take 2 tablets (1,200 mg total) by mouth 3 (three) times daily.  Dispense: 540 tablet; Refill: 3  3. Atrophic vaginitis Use premarin cream as prescribed.  - conjugated estrogens (PREMARIN) vaginal cream; Place 1 Applicatorful vaginally at bedtime. X2 weeks then decrease to twice weekly.  Dispense: 30 g; Refill: 12  4. Essential hypertension Continue HCTZ as prescribed.  - hydrochlorothiazide (HYDRODIURIL) 25 MG tablet; Take 1 tablet (25 mg total) by mouth every morning.  Dispense: 90 tablet; Refill: 0  5. Unsteady gait Will reassess for improvement with normal glucose levels.   6. History of recent fall Likely due to low blood glucose episodes.    General Counseling: Belinda Soto understanding of the findings of todays visit and agrees with plan of treatment. I have discussed any further diagnostic evaluation that may be needed or ordered today. We also reviewed her medications today. she has been encouraged to call the office with any questions or concerns that should arise related to todays visit.    Orders Placed This Encounter  Procedures   POCT glycosylated hemoglobin (Hb A1C)    Meds ordered this encounter  Medications   Insulin Glargine (BASAGLAR KWIKPEN) 100 UNIT/ML    Sig: Inject 16 Units into the skin at  bedtime.    Dispense:  15 mL    Refill:  0   gabapentin (NEURONTIN) 600 MG tablet    Sig: Take 2 tablets (1,200 mg total) by mouth 3 (three) times daily.    Dispense:  540 tablet    Refill:  3   conjugated estrogens (PREMARIN) vaginal cream    Sig: Place 1 Applicatorful vaginally at bedtime. X2 weeks then decrease to twice weekly.    Dispense:  30 g    Refill:  12   hydrochlorothiazide (HYDRODIURIL) 25 MG tablet    Sig: Take 1 tablet (25 mg total) by mouth every morning.    Dispense:  90 tablet    Refill:  0    Return in about 4 weeks (around 02/19/2023) for F/U, eval new med, Belinda Soto PCP sugars. .   Total time spent:30 Minutes Time spent includes review of chart, medications, test results, and follow up plan with the patient.   [place] Controlled Substance Database was reviewed by me.  This patient was seen by Sallyanne Kuster, FNP-C in collaboration with Dr. Beverely Risen as a part of collaborative care agreement.   Belinda Soto R. Tedd Sias, MSN, FNP-C Internal medicine

## 2023-01-22 NOTE — Patient Instructions (Signed)
Stop glimepiride and metformin.   Keep metformin on hand in case sugars get high again  Decrease basaglar dose to 16 units daily. Decrease to 8 units daily if glucose drops low again.   Use vaginal cream as directed.

## 2023-01-23 ENCOUNTER — Ambulatory Visit (INDEPENDENT_AMBULATORY_CARE_PROVIDER_SITE_OTHER): Payer: BC Managed Care – PPO | Admitting: Nurse Practitioner

## 2023-01-23 ENCOUNTER — Ambulatory Visit: Payer: BC Managed Care – PPO | Admitting: Nurse Practitioner

## 2023-01-23 DIAGNOSIS — R300 Dysuria: Secondary | ICD-10-CM | POA: Diagnosis not present

## 2023-01-23 DIAGNOSIS — N390 Urinary tract infection, site not specified: Secondary | ICD-10-CM | POA: Diagnosis not present

## 2023-01-23 DIAGNOSIS — R319 Hematuria, unspecified: Secondary | ICD-10-CM | POA: Diagnosis not present

## 2023-01-23 DIAGNOSIS — E1140 Type 2 diabetes mellitus with diabetic neuropathy, unspecified: Secondary | ICD-10-CM

## 2023-01-23 LAB — POCT URINALYSIS DIPSTICK
Bilirubin, UA: NEGATIVE
Glucose, UA: NEGATIVE
Ketones, UA: NEGATIVE
Leukocytes, UA: NEGATIVE
Nitrite, UA: NEGATIVE
Protein, UA: NEGATIVE
Spec Grav, UA: 1.01 (ref 1.010–1.025)
Urobilinogen, UA: 0.2 U/dL
pH, UA: 5 (ref 5.0–8.0)

## 2023-01-23 NOTE — Progress Notes (Unsigned)
Pt was here to for UA check

## 2023-01-24 ENCOUNTER — Telehealth: Payer: Self-pay

## 2023-01-24 ENCOUNTER — Other Ambulatory Visit: Payer: Self-pay | Admitting: Nurse Practitioner

## 2023-01-24 LAB — MICROALBUMIN / CREATININE URINE RATIO
Creatinine, Urine: 204.5 mg/dL
Microalb/Creat Ratio: 10 mg/g{creat} (ref 0–29)
Microalbumin, Urine: 20.6 ug/mL

## 2023-01-24 MED ORDER — CIPROFLOXACIN HCL 500 MG PO TABS: 500.0000 mg | ORAL_TABLET | Freq: Two times a day (BID) | ORAL | 0 refills | Status: AC

## 2023-01-24 NOTE — Telephone Encounter (Signed)
Pt advised UA showed large blood in urine alyssa send her cipro and we still waiting for urine culture once we get result if we need to change antibiotic we call her back

## 2023-01-27 LAB — CULTURE, URINE COMPREHENSIVE

## 2023-01-28 ENCOUNTER — Encounter: Payer: Self-pay | Admitting: Nurse Practitioner

## 2023-02-11 ENCOUNTER — Other Ambulatory Visit: Payer: Self-pay | Admitting: Nurse Practitioner

## 2023-02-11 DIAGNOSIS — Z760 Encounter for issue of repeat prescription: Secondary | ICD-10-CM

## 2023-02-23 ENCOUNTER — Telehealth: Payer: Self-pay | Admitting: Nurse Practitioner

## 2023-02-23 ENCOUNTER — Encounter: Payer: Self-pay | Admitting: Nurse Practitioner

## 2023-02-23 ENCOUNTER — Ambulatory Visit (INDEPENDENT_AMBULATORY_CARE_PROVIDER_SITE_OTHER): Payer: BC Managed Care – PPO | Admitting: Nurse Practitioner

## 2023-02-23 VITALS — BP 130/74 | HR 77 | Temp 98.5°F | Resp 16 | Ht 65.0 in | Wt 156.8 lb

## 2023-02-23 DIAGNOSIS — I10 Essential (primary) hypertension: Secondary | ICD-10-CM

## 2023-02-23 DIAGNOSIS — L720 Epidermal cyst: Secondary | ICD-10-CM

## 2023-02-23 DIAGNOSIS — L570 Actinic keratosis: Secondary | ICD-10-CM

## 2023-02-23 DIAGNOSIS — G63 Polyneuropathy in diseases classified elsewhere: Secondary | ICD-10-CM

## 2023-02-23 DIAGNOSIS — E1140 Type 2 diabetes mellitus with diabetic neuropathy, unspecified: Secondary | ICD-10-CM

## 2023-02-23 DIAGNOSIS — E349 Endocrine disorder, unspecified: Secondary | ICD-10-CM | POA: Diagnosis not present

## 2023-02-23 DIAGNOSIS — B3731 Acute candidiasis of vulva and vagina: Secondary | ICD-10-CM

## 2023-02-23 MED ORDER — GABAPENTIN 600 MG PO TABS: ORAL_TABLET | ORAL | 3 refills | Status: AC

## 2023-02-23 MED ORDER — LOSARTAN POTASSIUM-HCTZ 100-25 MG PO TABS: 1.0000 | ORAL_TABLET | Freq: Every day | ORAL | 1 refills | Status: DC

## 2023-02-23 MED ORDER — FLUCONAZOLE 150 MG PO TABS: 150.0000 mg | ORAL_TABLET | Freq: Once | ORAL | 0 refills | Status: AC

## 2023-02-23 MED ORDER — PREGABALIN 75 MG PO CAPS: 75.0000 mg | ORAL_CAPSULE | Freq: Two times a day (BID) | ORAL | 2 refills | Status: AC

## 2023-02-23 NOTE — Progress Notes (Signed)
[facility] [address] [place], [place] [address]  Internal MEDICINE  Office Visit Note  Patient Name: Belinda Soto  [id number]  [id number]  Date of Service: 02/23/2023  Chief Complaint  Patient presents with   Depression   Diabetes   Gastroesophageal Reflux   Hypertension   Follow-up    HPI Belinda Soto presents for a follow-up visit for multiple concerns including diabetes and hypertension Diabetes -- glimepiride was stopped at previous office visit. Metformin is on hold and her basaglar insulin dose was decreased to 16 units daily. She has been doing better and has had no hypoglycemic episodes. She did have 1 glucose level >200 but on average her readings have been about 150.  Hypertension -- currently on 4 BP medications. Taking amlodipine 2.5 mg daily, losartan 100 mg daily, HCTZ 25 mg daily and metoprolol tartrate 100 mg BID. Metoprolol was started in 2017 after seeing cardiology for palpitations and this was attributed to PACs and nonsustained atrial tachycardia.  Neuropathy of her feet and hands -- has been taking gabapentin 1200 mg TID but reports that this is not helping anymore but she is on max daily dose for gabapentin. Interested in trial something else.  Was treated with cipro for UTI, now having vaginal itching  Has multiple epidermoid cysts -- 1 on her upper back, another on her left neck and a third near her left clavicle.  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    Current Medication: Outpatient Encounter Medications as of 02/23/2023  Medication Sig   amLODipine (NORVASC) 2.5 MG tablet Take 1 tablet by mouth once daily   aspirin 81 MG tablet Take 81 mg by mouth daily.   calcium carbonate (TUMS - DOSED IN MG ELEMENTAL CALCIUM) 500 MG chewable tablet Chew 1 tablet by mouth as needed for indigestion or heartburn.   conjugated estrogens (PREMARIN) vaginal cream Place 1 Applicatorful  vaginally at bedtime. X2 weeks then decrease to twice weekly.   ferrous fumarate (HEMOCYTE - 106 MG FE) 325 (106 Fe) MG TABS tablet Take 1 tablet (106 mg of iron total) by mouth daily.   fluconazole (DIFLUCAN) 150 MG tablet Take 1 tablet (150 mg total) by mouth once for 1 dose. May take an additional dose after 3 days if still symptomatic.   glucose blood (ONETOUCH VERIO) test strip Use 1 test strip to check glucose with glucose meter twice daily and prn.   hydrocortisone 2.5 % ointment Apply topically.   Insulin Glargine (BASAGLAR KWIKPEN) 100 UNIT/ML Inject 16 Units into the skin at bedtime.   Insulin Pen Needle (NOVOFINE) 32G X 6 MM MISC USE WITH VICTOZA ONCE DAILY   Insulin Pen Needle 32G X 4 MM MISC Use as directed with victoza daily   Lancets (ONETOUCH DELICA PLUS LANCET30G) MISC Use 1 lancet to check glucose level with glucose meter twice daily and prn   losartan-hydrochlorothiazide (HYZAAR) 100-25 MG tablet Take 1 tablet by mouth daily.   meloxicam (MOBIC) 7.5 MG tablet Take 1 tablet (7.5 mg total) by mouth 2 (two) times daily.   metFORMIN (GLUCOPHAGE) 500 MG tablet Take 1 tablet (500 mg total) by mouth 2 (two) times daily.   metoprolol tartrate (LOPRESSOR) 100 MG tablet Take 1 tablet by mouth twice daily   pantoprazole (PROTONIX) 40 MG tablet Take 1 tablet (40 mg total) by mouth daily.   pregabalin (LYRICA) 75 MG capsule Take 1 capsule (75 mg total) by mouth  2 (two) times daily.   Semaglutide, 1 MG/DOSE, (OZEMPIC, 1 MG/DOSE,) 4 MG/3ML SOPN INJECT 1MG  SUBCUTANEOUSLY ONCE WEEKLY AS DIRECTED   VITAMIN D PO Take by mouth.   [DISCONTINUED] citalopram (CELEXA) 20 MG tablet Take 1 tab po daily   [DISCONTINUED] gabapentin (NEURONTIN) 600 MG tablet Take 2 tablets (1,200 mg total) by mouth 3 (three) times daily.   [DISCONTINUED] hydrochlorothiazide (HYDRODIURIL) 25 MG tablet Take 1 tablet (25 mg total) by mouth every morning.   [DISCONTINUED] losartan (COZAAR) 100 MG tablet Take 1 tablet (100 mg  total) by mouth every morning.   gabapentin (NEURONTIN) 600 MG tablet Take 2 tablets 3 times daily. For weaning, decrease daily total dose by 1 tablet every 4 days until stopped.   No facility-administered encounter medications on file as of 02/23/2023.    Surgical History: Past Surgical History:  Procedure Laterality Date   c sections     COLONOSCOPY N/A 03/08/2015   Procedure: COLONOSCOPY;  Surgeon: Wallace Cullens, MD;  Location: [facility] ENDOSCOPY;  Service: Gastroenterology;  Laterality: N/A;   COLONOSCOPY  02/2015   FISSURECTOMY N/A 08/24/2017   Procedure: FISSURECTOMY;  Surgeon: Kieth Brightly, MD;  Location: [facility] ORS;  Service: General;  Laterality: N/A;   FOOT SURGERY Left 2005   rotator cuff surgery Right 08/05/2021   SPHINCTEROTOMY N/A 08/24/2017   Procedure: SPHINCTEROTOMY;  Surgeon: Kieth Brightly, MD;  Location: [facility] ORS;  Service: General;  Laterality: N/A;    Medical History: Past Medical History:  Diagnosis Date   Arthritis    Depression    Diabetes mellitus without complication (HCC)    Dysrhythmia    palpitations   GERD (gastroesophageal reflux disease)    TUMS PRN   Headache    H/O MIGRAINES   History of kidney stones    Hypertension    Palpitations     Family History: Family History  Problem Relation Age of Onset   Diabetes Mother    Hypertension Mother    Glaucoma Maternal Grandmother    Alzheimer's disease Maternal Grandmother    Breast cancer Neg Hx     Social History   Socioeconomic History   Marital status: Married    Spouse name: Not on file   Number of children: Not on file   Years of education: Not on file   Highest education level: Not on file  Occupational History   Not on file  Tobacco Use   Smoking status: Former    Packs/day: 0.50    Years: 12.00    Additional pack years: 0.00    Total pack years: 6.00    Types: Cigarettes    Quit date: 08/20/1985    Years since quitting: 37.5   Smokeless tobacco: Never  Vaping  Use   Vaping Use: Never used  Substance and Sexual Activity   Alcohol use: Not Currently    Comment: rarely   Drug use: No   Sexual activity: Not on file  Other Topics Concern   Not on file  Social History Narrative   Not on file   Social Determinants of Health   Financial Resource Strain: Not on file  Food Insecurity: Not on file  Transportation Needs: Not on file  Physical Activity: Not on file  Stress: Not on file  Social Connections: Not on file  Intimate Partner Violence: Not on file      Review of Systems  Respiratory: Negative.  Negative for cough, chest tightness, shortness of breath and wheezing.   Cardiovascular:  Negative.  Negative for chest pain and palpitations.  Gastrointestinal: Negative.   Genitourinary:        Vaginal itching  Musculoskeletal: Negative.   Neurological:  Positive for numbness (and tingling of her hands and feet).  Hematological: Negative.   Psychiatric/Behavioral: Negative.  Negative for self-injury and suicidal ideas.     Vital Signs: BP 130/74   Pulse 77   Temp 98.5 F (36.9 C)   Resp 16   Ht 5\' 5"  (1.651 m)   Wt 156 lb 12.8 oz (71.1 kg)   SpO2 95%   BMI 26.09 kg/m    Physical Exam Vitals reviewed.  Constitutional:      General: She is not in acute distress.    Appearance: Normal appearance. She is not ill-appearing.  HENT:     Head: Normocephalic and atraumatic.  Eyes:     Pupils: Pupils are equal, round, and reactive to light.  Cardiovascular:     Rate and Rhythm: Normal rate and regular rhythm.  Pulmonary:     Effort: Pulmonary effort is normal. No respiratory distress.  Skin:    Findings: Lesion present.          Comments: Marked areas are epidermoid cysts. There are also 2 areas of actinic keratosis on her chest. Also has round flat pink/tan areas on her legs.  Neurological:     Mental Status: She is alert and oriented to person, place, and time.  Psychiatric:        Mood and Affect: Mood normal.         Behavior: Behavior normal.        Assessment/Plan: 1. Type 2 diabetes mellitus with diabetic neuropathy, without long-term current use of insulin (HCC) Continue glipizide XL as prescribed. Continue basaglar 16 units daily.  Continue to check A1c fasting. If blood glucose is consistently elevated >200, restart metformin.  - gabapentin (NEURONTIN) 600 MG tablet; Take 2 tablets 3 times daily. For weaning, decrease daily total dose by 1 tablet every 4 days until stopped.  Dispense: 540 tablet; Refill: 3  2. Essential hypertension Discontinue losartan and hydrochlorothiazide.  Start losartan-hydrochlorothiazide 100-25 mg daily.  Continue metoprolol 100 mg BID.  Stop amlodipine 2.5 mg daily -- will trial stopping this medication. Patient is aware that she will need to restart the medication if her blood pressure starts to go up  - losartan-hydrochlorothiazide (HYZAAR) 100-25 MG tablet; Take 1 tablet by mouth daily.  Dispense: 90 tablet; Refill: 1  3. Neuropathy associated with endocrine disorder (HCC) Start pregabalin and wean off gabapentin Decrease dose by 1 tablet every 4 days until she is down to 1 tablet daily then continue for 4 more days and then stop taking the medication.  - pregabalin (LYRICA) 75 MG capsule; Take 1 capsule (75 mg total) by mouth 2 (two) times daily.  Dispense: 60 capsule; Refill: 2  4. Epidermoid cyst Cyst on upper back, drained during office visit today, referred to dermatology.  - Ambulatory referral to Dermatology  5. Actinic keratosis Referred to dermatology - Ambulatory referral to Dermatology  6. Vulvovaginal candidiasis Fluconazole prescribed.  - fluconazole (DIFLUCAN) 150 MG tablet; Take 1 tablet (150 mg total) by mouth once for 1 dose. May take an additional dose after 3 days if still symptomatic.  Dispense: 3 tablet; Refill: 0   General Counseling: Belinda Soto understanding of the findings of todays visit and agrees with plan of treatment. I  have discussed any further diagnostic evaluation that may be needed or ordered today.  We also reviewed her medications today. she has been encouraged to call the office with any questions or concerns that should arise related to todays visit.    Orders Placed This Encounter  Procedures   Ambulatory referral to Dermatology    Meds ordered this encounter  Medications   fluconazole (DIFLUCAN) 150 MG tablet    Sig: Take 1 tablet (150 mg total) by mouth once for 1 dose. May take an additional dose after 3 days if still symptomatic.    Dispense:  3 tablet    Refill:  0    Fill today   losartan-hydrochlorothiazide (HYZAAR) 100-25 MG tablet    Sig: Take 1 tablet by mouth daily.    Dispense:  90 tablet    Refill:  1    Fill new script now, discontinue losartan and hydrochlorothiazide.   pregabalin (LYRICA) 75 MG capsule    Sig: Take 1 capsule (75 mg total) by mouth 2 (two) times daily.    Dispense:  60 capsule    Refill:  2   gabapentin (NEURONTIN) 600 MG tablet    Sig: Take 2 tablets 3 times daily. For weaning, decrease daily total dose by 1 tablet every 4 days until stopped.    Dispense:  540 tablet    Refill:  3    Return in about 1 month (around 03/25/2023) for F/U, eval new med, Britiny Defrain PCP.   Total time spent:30 Minutes Time spent includes review of chart, medications, test results, and follow up plan with the patient.   [place] Controlled Substance Database was reviewed by me.  This patient was seen by Sallyanne Kuster, FNP-C in collaboration with Dr. Beverely Risen as a part of collaborative care agreement.   Carisma Troupe R. Tedd Sias, MSN, FNP-C Internal medicine

## 2023-02-23 NOTE — Telephone Encounter (Signed)
Awaiting 01/26/23 office notes for Dermatology referral-Toni

## 2023-02-28 ENCOUNTER — Telehealth: Payer: Self-pay

## 2023-02-28 NOTE — Telephone Encounter (Signed)
Pt called that she forget what alyssa discuss at last visit as per alyssa advised her she need hold for amlodipine for now and take losartan-HCTZ  and metoprolol for now if Bp high we can add back amlodipine

## 2023-03-04 ENCOUNTER — Other Ambulatory Visit: Payer: Self-pay | Admitting: Nurse Practitioner

## 2023-03-04 DIAGNOSIS — Z760 Encounter for issue of repeat prescription: Secondary | ICD-10-CM

## 2023-03-06 ENCOUNTER — Telehealth: Payer: Self-pay | Admitting: Nurse Practitioner

## 2023-03-06 NOTE — Telephone Encounter (Signed)
281 pages of MR faxed to [facility]; [phone]

## 2023-03-13 ENCOUNTER — Telehealth: Payer: Self-pay | Admitting: Nurse Practitioner

## 2023-03-13 NOTE — Telephone Encounter (Signed)
Pt cancel and pt will call back to reschedule-nm

## 2023-03-20 ENCOUNTER — Encounter: Payer: BC Managed Care – PPO | Admitting: Nurse Practitioner

## 2023-03-23 ENCOUNTER — Ambulatory Visit: Payer: BC Managed Care – PPO | Admitting: Nurse Practitioner

## 2023-04-06 ENCOUNTER — Telehealth: Payer: Self-pay

## 2023-04-06 NOTE — Telephone Encounter (Signed)
Completed P.A. for patient's Ozempic. 

## 2023-04-07 ENCOUNTER — Other Ambulatory Visit: Payer: Self-pay | Admitting: Nurse Practitioner

## 2023-04-07 DIAGNOSIS — Z760 Encounter for issue of repeat prescription: Secondary | ICD-10-CM

## 2023-04-14 ENCOUNTER — Other Ambulatory Visit: Payer: Self-pay | Admitting: Nurse Practitioner

## 2023-04-14 DIAGNOSIS — Z760 Encounter for issue of repeat prescription: Secondary | ICD-10-CM

## 2023-06-25 ENCOUNTER — Other Ambulatory Visit: Payer: Self-pay | Admitting: Nurse Practitioner

## 2023-06-25 DIAGNOSIS — E1140 Type 2 diabetes mellitus with diabetic neuropathy, unspecified: Secondary | ICD-10-CM

## 2023-06-25 NOTE — Telephone Encounter (Signed)
Please review direction different in chart

## 2023-07-04 ENCOUNTER — Other Ambulatory Visit: Payer: Self-pay | Admitting: Nurse Practitioner

## 2023-07-04 DIAGNOSIS — Z760 Encounter for issue of repeat prescription: Secondary | ICD-10-CM

## 2023-07-27 ENCOUNTER — Other Ambulatory Visit: Payer: Self-pay | Admitting: Nurse Practitioner

## 2023-07-27 DIAGNOSIS — Z760 Encounter for issue of repeat prescription: Secondary | ICD-10-CM

## 2023-07-31 ENCOUNTER — Other Ambulatory Visit: Payer: Self-pay | Admitting: Nurse Practitioner

## 2023-07-31 DIAGNOSIS — Z760 Encounter for issue of repeat prescription: Secondary | ICD-10-CM

## 2023-08-03 ENCOUNTER — Other Ambulatory Visit: Payer: Self-pay | Admitting: Nurse Practitioner

## 2023-08-03 DIAGNOSIS — Z760 Encounter for issue of repeat prescription: Secondary | ICD-10-CM

## 2023-08-18 ENCOUNTER — Other Ambulatory Visit: Payer: Self-pay | Admitting: Nurse Practitioner

## 2023-08-18 DIAGNOSIS — I10 Essential (primary) hypertension: Secondary | ICD-10-CM

## 2023-08-19 NOTE — Telephone Encounter (Signed)
Pt is due for physical, schedule next year with me

## 2023-08-20 ENCOUNTER — Telehealth: Payer: Self-pay | Admitting: Internal Medicine

## 2023-08-20 NOTE — Telephone Encounter (Signed)
Called patient to schedule physical. She stated employer changed insurance and she has to go to different pcp. Self-discharged-Toni

## 2023-10-07 ENCOUNTER — Other Ambulatory Visit: Payer: Self-pay | Admitting: Nurse Practitioner

## 2023-10-07 DIAGNOSIS — Z760 Encounter for issue of repeat prescription: Secondary | ICD-10-CM

## 2023-10-16 ENCOUNTER — Other Ambulatory Visit: Payer: Self-pay | Admitting: Internal Medicine

## 2023-10-16 DIAGNOSIS — I10 Essential (primary) hypertension: Secondary | ICD-10-CM

## 2023-10-21 ENCOUNTER — Other Ambulatory Visit: Payer: Self-pay | Admitting: Nurse Practitioner

## 2023-10-21 ENCOUNTER — Other Ambulatory Visit: Payer: Self-pay | Admitting: Internal Medicine

## 2023-10-21 DIAGNOSIS — Z760 Encounter for issue of repeat prescription: Secondary | ICD-10-CM

## 2023-10-21 DIAGNOSIS — I10 Essential (primary) hypertension: Secondary | ICD-10-CM

## 2023-10-24 ENCOUNTER — Other Ambulatory Visit: Payer: Self-pay | Admitting: Internal Medicine

## 2023-10-24 DIAGNOSIS — I10 Essential (primary) hypertension: Secondary | ICD-10-CM

## 2023-10-30 ENCOUNTER — Other Ambulatory Visit: Payer: Self-pay | Admitting: Nurse Practitioner

## 2023-10-30 DIAGNOSIS — Z760 Encounter for issue of repeat prescription: Secondary | ICD-10-CM

## 2023-11-01 ENCOUNTER — Observation Stay: Payer: PRIVATE HEALTH INSURANCE

## 2023-11-01 ENCOUNTER — Observation Stay
Admission: EM | Admit: 2023-11-01 | Discharge: 2023-11-02 | Disposition: A | Discharge status: Home / Self Care | Payer: PRIVATE HEALTH INSURANCE | Attending: Student | Admitting: Student

## 2023-11-01 ENCOUNTER — Encounter: Payer: Self-pay | Admitting: Emergency Medicine

## 2023-11-01 ENCOUNTER — Other Ambulatory Visit: Payer: Self-pay

## 2023-11-01 ENCOUNTER — Other Ambulatory Visit: Payer: Self-pay | Admitting: Internal Medicine

## 2023-11-01 DIAGNOSIS — R7402 Elevation of levels of lactic acid dehydrogenase (LDH): Secondary | ICD-10-CM | POA: Insufficient documentation

## 2023-11-01 DIAGNOSIS — R7989 Other specified abnormal findings of blood chemistry: Secondary | ICD-10-CM

## 2023-11-01 DIAGNOSIS — R079 Chest pain, unspecified: Secondary | ICD-10-CM | POA: Insufficient documentation

## 2023-11-01 DIAGNOSIS — R112 Nausea with vomiting, unspecified: Secondary | ICD-10-CM | POA: Diagnosis present

## 2023-11-01 DIAGNOSIS — Z794 Long term (current) use of insulin: Secondary | ICD-10-CM | POA: Insufficient documentation

## 2023-11-01 DIAGNOSIS — Z79899 Other long term (current) drug therapy: Secondary | ICD-10-CM | POA: Diagnosis not present

## 2023-11-01 DIAGNOSIS — E1140 Type 2 diabetes mellitus with diabetic neuropathy, unspecified: Secondary | ICD-10-CM | POA: Diagnosis not present

## 2023-11-01 DIAGNOSIS — K529 Noninfective gastroenteritis and colitis, unspecified: Secondary | ICD-10-CM | POA: Insufficient documentation

## 2023-11-01 DIAGNOSIS — E860 Dehydration: Secondary | ICD-10-CM | POA: Diagnosis not present

## 2023-11-01 DIAGNOSIS — Z87891 Personal history of nicotine dependence: Secondary | ICD-10-CM | POA: Diagnosis not present

## 2023-11-01 DIAGNOSIS — T730XXA Starvation, initial encounter: Secondary | ICD-10-CM

## 2023-11-01 DIAGNOSIS — Z7901 Long term (current) use of anticoagulants: Secondary | ICD-10-CM | POA: Diagnosis not present

## 2023-11-01 DIAGNOSIS — Z1152 Encounter for screening for COVID-19: Secondary | ICD-10-CM | POA: Diagnosis not present

## 2023-11-01 DIAGNOSIS — Z8659 Personal history of other mental and behavioral disorders: Secondary | ICD-10-CM | POA: Insufficient documentation

## 2023-11-01 DIAGNOSIS — I10 Essential (primary) hypertension: Secondary | ICD-10-CM | POA: Diagnosis not present

## 2023-11-01 DIAGNOSIS — N179 Acute kidney failure, unspecified: Secondary | ICD-10-CM | POA: Insufficient documentation

## 2023-11-01 DIAGNOSIS — E872 Acidosis, unspecified: Secondary | ICD-10-CM

## 2023-11-01 DIAGNOSIS — R197 Diarrhea, unspecified: Secondary | ICD-10-CM

## 2023-11-01 LAB — COMPREHENSIVE METABOLIC PANEL WITH GFR
ALT: 19 U/L (ref 0–44)
AST: 37 U/L (ref 15–41)
Albumin: 4.6 g/dL (ref 3.5–5.0)
Alkaline Phosphatase: 97 U/L (ref 38–126)
Anion gap: 26 — ABNORMAL HIGH (ref 5–15)
BUN: 29 mg/dL — ABNORMAL HIGH (ref 8–23)
CO2: 15 mmol/L — ABNORMAL LOW (ref 22–32)
Calcium: 9.7 mg/dL (ref 8.9–10.3)
Chloride: 98 mmol/L (ref 98–111)
Creatinine, Ser: 1.33 mg/dL — ABNORMAL HIGH (ref 0.44–1.00)
GFR, Estimated: 44 mL/min — ABNORMAL LOW (ref >60)
Glucose, Bld: 110 mg/dL — ABNORMAL HIGH (ref 70–99)
Potassium: 4.2 mmol/L (ref 3.5–5.1)
Sodium: 139 mmol/L (ref 135–145)
Total Bilirubin: 0.9 mg/dL (ref 0.0–1.2)
Total Protein: 8.4 g/dL — ABNORMAL HIGH (ref 6.5–8.1)

## 2023-11-01 LAB — CBC WITH DIFFERENTIAL/PLATELET
Abs Immature Granulocytes: 0.05 K/uL (ref 0.00–0.07)
Basophils Absolute: 0 K/uL (ref 0.0–0.1)
Basophils Relative: 0 %
Eosinophils Absolute: 0 K/uL (ref 0.0–0.5)
Eosinophils Relative: 0 %
HCT: 38.8 % (ref 36.0–46.0)
Hemoglobin: 12.6 g/dL (ref 12.0–15.0)
Immature Granulocytes: 1 %
Lymphocytes Relative: 9 %
Lymphs Abs: 0.9 K/uL (ref 0.7–4.0)
MCH: 29.4 pg (ref 26.0–34.0)
MCHC: 32.5 g/dL (ref 30.0–36.0)
MCV: 90.4 fL (ref 80.0–100.0)
Monocytes Absolute: 0.2 K/uL (ref 0.1–1.0)
Monocytes Relative: 2 %
Neutro Abs: 8.6 K/uL — ABNORMAL HIGH (ref 1.7–7.7)
Neutrophils Relative %: 88 %
Platelets: 206 K/uL (ref 150–400)
RBC: 4.29 MIL/uL (ref 3.87–5.11)
RDW: 12.2 % (ref 11.5–15.5)
WBC: 9.9 K/uL (ref 4.0–10.5)
nRBC: 0 % (ref 0.0–0.2)

## 2023-11-01 LAB — SALICYLATE LEVEL: Salicylate Lvl: <7 mg/dL — ABNORMAL LOW (ref 7.0–30.0)

## 2023-11-01 LAB — LACTIC ACID, PLASMA: Lactic Acid, Venous: 7.6 mmol/L (ref 0.5–1.9)

## 2023-11-01 LAB — LIPASE, BLOOD: Lipase: 31 U/L (ref 11–51)

## 2023-11-01 LAB — BETA-HYDROXYBUTYRIC ACID: Beta-Hydroxybutyric Acid: 2.35 mmol/L — ABNORMAL HIGH (ref 0.05–0.27)

## 2023-11-01 LAB — RESP PANEL BY RT-PCR (RSV, FLU A&B, COVID)  RVPGX2
Influenza A by PCR: NEGATIVE
Influenza B by PCR: NEGATIVE
Resp Syncytial Virus by PCR: NEGATIVE
SARS Coronavirus 2 by RT PCR: NEGATIVE

## 2023-11-01 LAB — CK: Total CK: 48 U/L (ref 38–234)

## 2023-11-01 LAB — MAGNESIUM: Magnesium: 1.4 mg/dL — ABNORMAL LOW (ref 1.7–2.4)

## 2023-11-01 LAB — PHOSPHORUS: Phosphorus: 5.1 mg/dL — ABNORMAL HIGH (ref 2.5–4.6)

## 2023-11-01 LAB — ETHANOL: Alcohol, Ethyl (B): <10 mg/dL (ref <10)

## 2023-11-01 MED ORDER — PANTOPRAZOLE SODIUM 40 MG PO TBEC
40.0000 mg | DELAYED_RELEASE_TABLET | Freq: Every day | ORAL | Status: DC
Start: 2023-11-02 — End: 2023-11-02
  Administered 2023-11-02: 40 mg via ORAL
  Filled 2023-11-01: qty 1

## 2023-11-01 MED ORDER — INSULIN ASPART 100 UNIT/ML IJ SOLN: 0.0000 [IU] | Freq: Every day | INTRAMUSCULAR | Status: DC

## 2023-11-01 MED ORDER — LACTATED RINGERS IV BOLUS
1000.0000 mL | Freq: Once | INTRAVENOUS | Status: AC
  Administered 2023-11-01: 1000 mL via INTRAVENOUS

## 2023-11-01 MED ORDER — INFUVITE ADULT IV SOLN: Freq: Once | INTRAVENOUS | Status: DC

## 2023-11-01 MED ORDER — LACTATED RINGERS IV BOLUS: 1000.0000 mL | Freq: Once | INTRAVENOUS | Status: DC

## 2023-11-01 MED ORDER — ASPIRIN 81 MG PO TBEC
81.0000 mg | DELAYED_RELEASE_TABLET | Freq: Every day | ORAL | Status: DC
  Administered 2023-11-02: 81 mg via ORAL
  Filled 2023-11-01: qty 1

## 2023-11-01 MED ORDER — INSULIN ASPART 100 UNIT/ML IJ SOLN: 0.0000 [IU] | Freq: Three times a day (TID) | INTRAMUSCULAR | Status: DC

## 2023-11-01 MED ORDER — METOPROLOL TARTRATE 25 MG PO TABS
100.0000 mg | ORAL_TABLET | Freq: Two times a day (BID) | ORAL | Status: DC
  Administered 2023-11-02: 100 mg via ORAL
  Filled 2023-11-01: qty 4

## 2023-11-01 MED ORDER — ONDANSETRON HCL 4 MG/2ML IJ SOLN
4.0000 mg | Freq: Three times a day (TID) | INTRAMUSCULAR | Status: DC | PRN
  Administered 2023-11-02: 4 mg via INTRAVENOUS
  Filled 2023-11-01: qty 2

## 2023-11-01 MED ORDER — LACTATED RINGERS IV BOLUS: 500.0000 mL | Freq: Once | INTRAVENOUS | Status: DC

## 2023-11-01 MED ORDER — IOHEXOL 300 MG/ML  SOLN
75.0000 mL | Freq: Once | INTRAMUSCULAR | Status: AC | PRN
  Administered 2023-11-02: 75 mL via INTRAVENOUS

## 2023-11-01 MED ORDER — NITROGLYCERIN 0.4 MG SL SUBL: 0.4000 mg | SUBLINGUAL_TABLET | SUBLINGUAL | Status: DC | PRN

## 2023-11-01 MED ORDER — GABAPENTIN 300 MG PO CAPS
1200.0000 mg | ORAL_CAPSULE | Freq: Three times a day (TID) | ORAL | Status: DC
  Administered 2023-11-02: 1200 mg via ORAL
  Filled 2023-11-01: qty 4

## 2023-11-01 MED ORDER — FENTANYL CITRATE PF 50 MCG/ML IJ SOSY: 25.0000 ug | PREFILLED_SYRINGE | INTRAMUSCULAR | Status: DC | PRN

## 2023-11-01 MED ORDER — HYDRALAZINE HCL 20 MG/ML IJ SOLN: 5.0000 mg | INTRAMUSCULAR | Status: DC | PRN

## 2023-11-01 MED ORDER — ACETAMINOPHEN 325 MG PO TABS
650.0000 mg | ORAL_TABLET | Freq: Four times a day (QID) | ORAL | Status: DC | PRN
  Administered 2023-11-02 (×2): 650 mg via ORAL
  Filled 2023-11-01 (×2): qty 2

## 2023-11-01 MED ORDER — PREGABALIN 50 MG PO CAPS
75.0000 mg | ORAL_CAPSULE | Freq: Two times a day (BID) | ORAL | Status: DC
  Administered 2023-11-02: 75 mg via ORAL
  Filled 2023-11-01: qty 1

## 2023-11-01 MED ORDER — BASAGLAR KWIKPEN 100 UNIT/ML ~~LOC~~ SOPN: 10.0000 [IU] | PEN_INJECTOR | Freq: Every day | SUBCUTANEOUS | Status: DC

## 2023-11-01 MED ORDER — LACTATED RINGERS IV SOLN: INTRAVENOUS | Status: DC

## 2023-11-01 MED ORDER — AMLODIPINE BESYLATE 5 MG PO TABS
2.5000 mg | ORAL_TABLET | Freq: Every day | ORAL | Status: DC
  Administered 2023-11-02: 2.5 mg via ORAL
  Filled 2023-11-01: qty 1

## 2023-11-01 MED ORDER — ENOXAPARIN SODIUM 40 MG/0.4ML IJ SOSY
40.0000 mg | PREFILLED_SYRINGE | INTRAMUSCULAR | Status: DC
  Administered 2023-11-02: 40 mg via SUBCUTANEOUS
  Filled 2023-11-01: qty 0.4

## 2023-11-01 NOTE — ED Provider Notes (Signed)
 [facility] Provider Note    Event Date/Time   First MD Initiated Contact with Patient 11/01/23 2137     (approximate)   History   Emesis   HPI  Belinda Soto is a 68 y.o. female with a history of type 2 diabetes, hypertension abnormal LFTs, kidney stones   Patient reports she was in her normal health until yesterday afternoon she was at work and suddenly began having severe episodes of watery loose diarrhea.  She reports she had about 5 episodes with severe large amounts of watery diarrhea.  She has had no further diarrhea today and has not had any abdominal pain but has had multiple rounds of vomiting numerous times at the house clear watery in nature.  No fevers or chills denies abdominal pain no shortness of breath or chest pain  She reports uncontrollable vomiting.  Called her doctor and they prescribed her Zofran  which she started and reports after taking a tablet of 8 mg that her vomiting has improved and she is actually been able to drink some fluids including soda at this time.  She does have a history of diabetes is compliant with her medications  She is not aware of any fevers.  No aspirin  use   Physical Exam   Triage Vital Signs: ED Triage Vitals [11/01/23 1924]  Encounter Vitals Group     BP (!) 159/99     Systolic BP Percentile      Diastolic BP Percentile      Pulse Rate (!) 106     Resp 16     Temp 98.2 F (36.8 C)     Temp Source Oral     SpO2 98 %     Weight 157 lb (71.2 kg)     Height 5' 5 (1.651 m)     Head Circumference      Peak Flow      Pain Score 0     Pain Loc      Pain Education      Exclude from Growth Chart     Most recent vital signs: Vitals:   11/01/23 1924 11/01/23 2316  BP: (!) 159/99 138/80  Pulse: (!) 106 93  Resp: 16 18  Temp: 98.2 F (36.8 C) 97.7 F (36.5 C)  SpO2: 98% 99%     General: Awake, no distress.  Mucous membranes are dry but she is in no distress. CV:  Good peripheral perfusion.   Mild tachycardia heart rate approximately 120 at rest in the bed.  No murmur Resp:  Normal effort.  Clear lung sounds normal work of breathing Abd:  No distention.  Soft nontender nondistended throughout.  Patient reports she has had no abdominal pain right now she has no pain or discomfort no further nausea but she feels very dry and dehydrated.  Negative Murphy.  No pain McBurney's point.  No peritonitis or discomfort in any quadrant. Other:  Skin turgor reduced   ED Results / Procedures / Treatments   Labs (all labs ordered are listed, but only abnormal results are displayed) Labs Reviewed  CBC WITH DIFFERENTIAL/PLATELET - Abnormal; Notable for the following components:      Result Value   Neutro Abs 8.6 (*)    All other components within normal limits  COMPREHENSIVE METABOLIC PANEL - Abnormal; Notable for the following components:   CO2 15 (*)    Glucose, Bld 110 (*)    BUN 29 (*)    Creatinine, Ser 1.33 (*)  Total Protein 8.4 (*)    GFR, Estimated 44 (*)    Anion gap 26 (*)    All other components within normal limits  LACTIC ACID, PLASMA - Abnormal; Notable for the following components:   Lactic Acid, Venous 7.6 (*)    All other components within normal limits  SALICYLATE LEVEL - Abnormal; Notable for the following components:   Salicylate Lvl <7.0 (*)    All other components within normal limits  BLOOD GAS, VENOUS - Abnormal; Notable for the following components:   pCO2, Ven 38 (*)    Bicarbonate 18.7 (*)    Acid-base deficit 7.1 (*)    All other components within normal limits  BETA-HYDROXYBUTYRIC ACID - Abnormal; Notable for the following components:   Beta-Hydroxybutyric Acid 2.35 (*)    All other components within normal limits  RESP PANEL BY RT-PCR (RSV, FLU A&B, COVID)  [id number]  GASTROINTESTINAL PANEL BY PCR, STOOL (REPLACES STOOL CULTURE)  C DIFFICILE QUICK SCREEN W PCR REFLEX    LIPASE, BLOOD  CK  ETHANOL  URINALYSIS, ROUTINE W REFLEX MICROSCOPIC   MAGNESIUM   PHOSPHORUS  LACTIC ACID, PLASMA   Labs notable for markedly elevated anion gap.  This is in the setting of reduced CO2 and what sounds like large amount of volume loss and watery stool.  Additional labs including CK VBG, beta hydroxybutyrate -ordered.    RADIOLOGY  No clinical history imaging at this time.  Afebrile no elevated white count no associate abdominal pain on examination and plan    The patient's abdominal exam is quite reassuring, her lactic acid came back markedly elevated.  Further evaluation it seems prudent to obtain imaging to exclude severe acute intra-abdominal processes or obvious findings to suggest ischemic evidence of elevated lactate though this seems unlikely that associated abdominal pain.  Dr. Cyrena to follow-up on results of CT scan   PROCEDURES:  Critical Care performed: Yes, see critical care procedure note(s)  CRITICAL CARE Performed by: Oneil Budge   Total critical care time: 25 minutes  Critical care time was exclusive of separately billable procedures and treating other patients.  Critical care was necessary to treat or prevent imminent or life-threatening deterioration.  Critical care was time spent personally by me on the following activities: development of treatment plan with patient and/or surrogate as well as nursing, discussions with consultants, evaluation of patient's response to treatment, examination of patient, obtaining history from patient or surrogate, ordering and performing treatments and interventions, ordering and review of laboratory studies, ordering and review of radiographic studies, pulse oximetry and re-evaluation of patient's condition.  Severely elevated lactic acid in the setting what appears to be significant dehydration.  Workup is still be completed including reevaluation of lactic acid after additional fluid bolus, follow-up on CT scan obvious need for admission.  Beta hydroxy is also somewhat elevated  likely due to starvation ketosis, doubt DKA given normal pH at this time as well as her type II status and current medications.  Procedures   MEDICATIONS ORDERED IN ED: Medications              lactated ringers  bolus 1,000 mL (0 mLs Intravenous Stopped 11/01/23 2320)  lactated ringers  bolus 1,000 mL (1,000 mLs Intravenous New Bag/Given 11/01/23 2320)     IMPRESSION / MDM / ASSESSMENT AND PLAN / ED COURSE  I reviewed the triage vital signs and the nursing notes.  Differential diagnosis includes, but is not limited to, acute gastrointestinal illness, suspect likely self-limited GI illness especially given the lack of pain as well as the severity of sudden onset loose watery stool followed by emesis.  Additional considerations but seem less likely would be causes such as back material infection, acute bacterial colitis inflammatory colitis peptic ulcer disease GI bleeding, diverticulitis or other acute intra-abdominal infection.  No associated cardiopulmonary symptoms.  She appears moderately dehydrated by clinical exam.  Her labs show a markedly elevated anion gap as well as mild to moderate AKI.  Will begin with generous hydration.  Reports symptoms are well-controlled with regard to nausea and has no associated pain.  Very reassuring abdominal exam  Patient's presentation is most consistent with acute complicated illness / injury requiring diagnostic workup.  ----------------------------------------- 11:34 PM on 11/01/2023 ----------------------------------------- Ongoing care and treatment assigned to Dr. Cyrena.  Given the patient's severely elevated lactic acid CT scans.  Patient remains well fully oriented no distress vital signs have normalized after fluid boluses.  Repeat lactate has been ordered.  At this juncture lactic acid does not clearly seem to be caused by an acute bacterial illness, but we are still working to exclude this.  No indication for  broad-spectrum antibiotic coverage yet, I am suspicious for a self-limited GI illness but we are still going to exclude other causes such as ischemia, acute intra-abdominal infectious pictures etc.  The patient is noted to have a lactate>4. With the current information available to me, I don't think the patient is in septic shock. The lactate>4, is related to severe dehydration causing increased metabolic demand.  [The patient was sent to hospitalist of request for cancellation show admission plan as we continue workup.  Certainly anticipate the patient admission, but at this juncture we wish to exclude additional intra-abdominal causation prior to admission. Dr. Hilma notifed of request to hold/cancel admit plan until further eval and CT resulting] Dr. Cyrena to reconsult hospitalist once further work-up completed.          FINAL CLINICAL IMPRESSION(S) / ED DIAGNOSES   Final diagnoses:  Nausea vomiting and diarrhea  Lactic acid acidosis  Starvation ketoacidosis  Dehydration, severe     Rx / DC Orders   ED Discharge Orders     None        Note:  This document was prepared using Dragon voice recognition software and may include unintentional dictation errors.   Dicky Anes, MD 11/01/23 2337

## 2023-11-01 NOTE — H&P (Addendum)
 History and Physical    [name] FMW:[id number] DOB: 08/10/1956 DOA: 11/01/2023  Referring MD/NP/PA:   PCP: Pcp, No   Patient coming from:  The patient is coming from home.     Chief Complaint: Nausea, vomiting, diarrhea, chest pain  HPI: Belinda Soto is a 67 y.o. female with medical history significant of hypertension, diabetes mellitus, peripheral neuropathy, anemia, psoriasis, kidney stone, varicose vein, who presents with nausea, vomiting, diarrhea and chest pain.  Patient states that she started having nausea, vomiting, diarrhea since yesterday afternoon when she was at work.  She has had numerous times of nonbilious nonbloody vomiting, and 6 times of watery diarrhea.  No sick contact.  Patient initially did not have chest pain, but developed chest pain in the ED.  Her chest pain is located in the substernal area, pressure-like, mild to moderate, nonradiating.  Not associated with shortness od breath.  No cough, fever or chills.  No symptoms of UTI.  Denies recent long distance traveling.  No tenderness in the calf areas. Pt called her doctor and they prescribed her Zofran . She reports after taking a tablet of 8 mg, her vomiting has improved.    Data reviewed independently and ED Course: pt was found to have WBC 9.7, magnesium  1.4, phosphorus 5.4, potassium 4.2, negative PCR for COVID, flu and RSV, AKI with creatinine 1.33, BUN 29, GFR 44 (recent baseline of 0.90 on 03/24/2022), CK 48, lactic acid 7.6, temperature normal, blood pressure 138/80, heart rate 106, 93, RR 18, oxygen saturation 99% on room air.  Patient is placed on telemetry bed for position.   EKG: I have personally reviewed.  Sinus rhythm, QTc 490, low voltage, anteroseptal infarction pattern   Review of Systems:   General: no fevers, chills, no body weight gain, has poor appetite, has fatigue HEENT: no blurry vision, hearing changes or sore throat Respiratory: no dyspnea, coughing, wheezing CV: has chest pain, no  palpitations GI: has nausea, vomiting, diarrhea, no AP and constipation GU: no dysuria, burning on urination, increased urinary frequency, hematuria  Ext: no leg edema Neuro: no unilateral weakness, numbness, or tingling, no vision change or hearing loss Skin: no rash, no skin tear. MSK: No muscle spasm, no deformity, no limitation of range of movement in spin Heme: No easy bruising.  Travel history: No recent long distant travel.   Allergy:  Allergies  Allergen Reactions   Sulfa Antibiotics Swelling    Facial swelling Lip swells.   Codeine Nausea And Vomiting   Morphine And Codeine Itching    Past Medical History:  Diagnosis Date   Arthritis    Depression    Diabetes mellitus without complication (HCC)    Dysrhythmia    palpitations   GERD (gastroesophageal reflux disease)    TUMS PRN   Headache    H/O MIGRAINES   History of kidney stones    Hypertension    Palpitations     Past Surgical History:  Procedure Laterality Date   c sections     COLONOSCOPY N/A 03/08/2015   Procedure: COLONOSCOPY;  Surgeon: Deward CINDERELLA Piedmont, MD;  Location: [facility] ENDOSCOPY;  Service: Gastroenterology;  Laterality: N/A;   COLONOSCOPY  02/2015   FISSURECTOMY N/A 08/24/2017   Procedure: FISSURECTOMY;  Surgeon: Dellie Louanne MATSU, MD;  Location: [facility] ORS;  Service: General;  Laterality: N/A;   FOOT SURGERY Left 2005   rotator cuff surgery Right 08/05/2021   SPHINCTEROTOMY N/A 08/24/2017   Procedure: SPHINCTEROTOMY;  Surgeon: Dellie Louanne MATSU, MD;  Location: [facility] ORS;  Service: General;  Laterality: N/A;    Social History:  reports that she quit smoking about 38 years ago. Her smoking use included cigarettes. She started smoking about 50 years ago. She has a 6 pack-year smoking history. She has never used smokeless tobacco. She reports that she does not currently use alcohol. She reports that she does not use drugs.  Family History:  Family History  Problem Relation Age of Onset    Diabetes Mother    Hypertension Mother    Glaucoma Maternal Grandmother    Alzheimer's disease Maternal Grandmother    Breast cancer Neg Hx      Prior to Admission medications   Medication Sig Start Date End Date Taking? Authorizing Provider  amLODipine  (NORVASC ) 2.5 MG tablet Take 1 tablet by mouth once daily 02/12/23   Abernathy, [name], NP  aspirin  81 MG tablet Take 81 mg by mouth daily.    [provider]  calcium carbonate (TUMS - DOSED IN MG ELEMENTAL CALCIUM) 500 MG chewable tablet Chew 1 tablet by mouth as needed for indigestion or heartburn.    [provider]  conjugated estrogens  (PREMARIN ) vaginal cream Place 1 Applicatorful vaginally at bedtime. X2 weeks then decrease to twice weekly. 01/22/23   Liana Fish, NP  ferrous fumarate  (HEMOCYTE - 106 MG FE) 325 (106 Fe) MG TABS tablet Take 1 tablet (106 mg of iron total) by mouth daily. 03/29/22   Liana Fish, NP  gabapentin  (NEURONTIN ) 600 MG tablet Take 2 tablets 3 times daily. For weaning, decrease daily total dose by 1 tablet every 4 days until stopped. 02/23/23   Liana Fish, NP  glucose blood (ONETOUCH VERIO) test strip Use 1 test strip to check glucose with glucose meter twice daily and prn. 05/01/22   Abernathy, [name], NP  hydrocortisone 2.5 % ointment Apply topically.    [provider]  Insulin  Glargine (BASAGLAR  KWIKPEN) 100 UNIT/ML Inject 16 Units into the skin at bedtime. 06/27/23   Liana Fish, NP  Insulin  Pen Needle (NOVOFINE) 32G X 6 MM MISC USE WITH VICTOZA  ONCE DAILY 01/27/20   Hanford Powell BRAVO, NP  Insulin  Pen Needle 32G X 4 MM MISC Use as directed with victoza  daily 07/05/20   Boscia, Heather E, NP  Lancets (ONETOUCH DELICA PLUS LANCET30G) MISC Use 1 lancet to check glucose level with glucose meter twice daily and prn 05/01/22   Abernathy, [name], NP  losartan -hydrochlorothiazide  (HYZAAR) 100-25 MG tablet Take 1 tablet by mouth once daily 08/19/23   Khan, Fozia M, MD  meloxicam   (MOBIC ) 7.5 MG tablet Take 1 tablet by mouth twice daily 03/05/23   Abernathy, [name], NP  metFORMIN  (GLUCOPHAGE ) 500 MG tablet Take 2 tablets by mouth twice daily 07/04/23   Abernathy, [name], NP  metoprolol  tartrate (LOPRESSOR ) 100 MG tablet Take 1 tablet by mouth twice daily 11/30/22   Abernathy, [name], NP  pantoprazole  (PROTONIX ) 40 MG tablet Take 1 tablet by mouth once daily 07/27/23   Liana Fish, NP  pregabalin  (LYRICA ) 75 MG capsule Take 1 capsule (75 mg total) by mouth 2 (two) times daily. 02/23/23   Liana Fish, NP  Semaglutide , 1 MG/DOSE, (OZEMPIC , 1 MG/DOSE,) 4 MG/3ML SOPN INJECT 1MG  SUBCUTANEOUSLY ONCE WEEKLY AS DIRECTED 12/26/22   Liana Fish, NP  VITAMIN D  PO Take by mouth.    [provider]    Physical Exam: Vitals:   11/01/23 1924 11/01/23 2316  BP: (!) 159/99 138/80  Pulse: (!) 106 93  Resp: 16 18  Temp: 98.2 F (36.8 C) 97.7 F (36.5 C)  TempSrc: Oral Oral  SpO2: 98% 99%  Weight: 71.2 kg   Height: 5' 5 (1.651 m)    General: Not in acute distress.  Dry mucous membrane HEENT:       Eyes: PERRL, EOMI, no jaundice       ENT: No discharge from the ears and nose, no pharynx injection, no tonsillar enlargement.        Neck: No JVD, no bruit, no mass felt. Heme: No neck lymph node enlargement. Cardiac: S1/S2, RRR, No murmurs, No gallops or rubs. Respiratory: No rales, wheezing, rhonchi or rubs. GI: Soft, nondistended, nontender, no rebound pain, no organomegaly, BS present. GU: No hematuria Ext: No pitting leg edema bilaterally. 1+DP/PT pulse bilaterally. Musculoskeletal: No joint deformities, No joint redness or warmth, no limitation of ROM in spin. Skin: No rashes.  Neuro: Alert, oriented X3, cranial nerves II-XII grossly intact, moves all extremities normall  Psych: Patient is not psychotic, no suicidal or hemocidal ideation.  Labs on Admission: I have personally reviewed following labs and imaging studies  CBC: Recent Labs  Lab  11/01/23 1925  WBC 9.9  NEUTROABS 8.6*  HGB 12.6  HCT 38.8  MCV 90.4  PLT 206   Basic Metabolic Panel: Recent Labs  Lab 11/01/23 1925  NA 139  K 4.2  CL 98  CO2 15*  GLUCOSE 110*  BUN 29*  CREATININE 1.33*  CALCIUM 9.7  MG 1.4*  PHOS 5.1*   GFR: Estimated Creatinine Clearance: 41.7 mL/min (A) (by C-G formula based on SCr of 1.33 mg/dL (H)). Liver Function Tests: Recent Labs  Lab 11/01/23 1925  AST 37  ALT 19  ALKPHOS 97  BILITOT 0.9  PROT 8.4*  ALBUMIN 4.6   Recent Labs  Lab 11/01/23 1925  LIPASE 31   No results for input(s): AMMONIA in the last 168 hours. Coagulation Profile: No results for input(s): INR, PROTIME in the last 168 hours. Cardiac Enzymes: Recent Labs  Lab 11/01/23 2208  CKTOTAL 48   BNP (last 3 results) No results for input(s): PROBNP in the last 8760 hours. HbA1C: No results for input(s): HGBA1C in the last 72 hours. CBG: Recent Labs  Lab 11/01/23 2359  GLUCAP 92   Lipid Profile: No results for input(s): CHOL, HDL, LDLCALC, TRIG, CHOLHDL, LDLDIRECT in the last 72 hours. Thyroid  Function Tests: No results for input(s): TSH, T4TOTAL, FREET4, T3FREE, THYROIDAB in the last 72 hours. Anemia Panel: No results for input(s): VITAMINB12, FOLATE, FERRITIN, TIBC, IRON, RETICCTPCT in the last 72 hours. Urine analysis:    Component Value Date/Time   COLORURINE Yellow 07/23/2014 1332   APPEARANCEUR Clear 03/14/2022 1525   LABSPEC 1.021 07/23/2014 1332   PHURINE 5.0 07/23/2014 1332   GLUCOSEU Negative 03/14/2022 1525   GLUCOSEU >=500 07/23/2014 1332   HGBUR 3+ 07/23/2014 1332   BILIRUBINUR Negative 01/23/2023 1612   BILIRUBINUR Negative 03/14/2022 1525   BILIRUBINUR Negative 07/23/2014 1332   KETONESUR Negative 07/23/2014 1332   PROTEINUR Negative 01/23/2023 1612   PROTEINUR Negative 03/14/2022 1525   PROTEINUR 100 mg/dL 89/70/7984 8667   UROBILINOGEN 0.2 01/23/2023 1612   NITRITE  Negative 01/23/2023 1612   NITRITE Negative 03/14/2022 1525   NITRITE Negative 07/23/2014 1332   LEUKOCYTESUR Negative 01/23/2023 1612   LEUKOCYTESUR Negative 03/14/2022 1525   LEUKOCYTESUR 3+ 07/23/2014 1332   Sepsis Labs: @LABRCNTIP (procalcitonin:4,lacticidven:4) ) Recent Results (from the past 240 hours)  Resp panel by RT-PCR (RSV, Flu A&B, Covid) Anterior Nasal Swab  Status: None   Collection Time: 11/01/23  7:25 PM   Specimen: Anterior Nasal Swab  Result Value Ref Range Status   SARS Coronavirus 2 by RT PCR NEGATIVE NEGATIVE Final    Comment: (NOTE) SARS-CoV-2 target nucleic acids are NOT DETECTED.  The SARS-CoV-2 RNA is generally detectable in upper respiratory specimens during the acute phase of infection. The lowest concentration of SARS-CoV-2 viral copies this assay can detect is 138 copies/mL. A negative result does not preclude SARS-Cov-2 infection and should not be used as the sole basis for treatment or other patient management decisions. A negative result may occur with  improper specimen collection/handling, submission of specimen other than nasopharyngeal swab, presence of viral mutation(s) within the areas targeted by this assay, and inadequate number of viral copies(<138 copies/mL). A negative result must be combined with clinical observations, patient history, and epidemiological information. The expected result is Negative.  Fact Sheet for Patients:  bloggercourse.com  Fact Sheet for Healthcare Providers:  seriousbroker.it  This test is no t yet approved or cleared by the [place]  FDA and  has been authorized for detection and/or diagnosis of SARS-CoV-2 by FDA under an Emergency Use Authorization (EUA). This EUA will remain  in effect (meaning this test can be used) for the duration of the COVID-19 declaration under Section 564(b)(1) of the Act, 21 U.S.C.section 360bbb-3(b)(1), unless the  authorization is terminated  or revoked sooner.       Influenza A by PCR NEGATIVE NEGATIVE Final   Influenza B by PCR NEGATIVE NEGATIVE Final    Comment: (NOTE) The Xpert Xpress SARS-CoV-2/FLU/RSV plus assay is intended as an aid in the diagnosis of influenza from Nasopharyngeal swab specimens and should not be used as a sole basis for treatment. Nasal washings and aspirates are unacceptable for Xpert Xpress SARS-CoV-2/FLU/RSV testing.  Fact Sheet for Patients: bloggercourse.com  Fact Sheet for Healthcare Providers: seriousbroker.it  This test is not yet approved or cleared by the [place]  FDA and has been authorized for detection and/or diagnosis of SARS-CoV-2 by FDA under an Emergency Use Authorization (EUA). This EUA will remain in effect (meaning this test can be used) for the duration of the COVID-19 declaration under Section 564(b)(1) of the Act, 21 U.S.C. section 360bbb-3(b)(1), unless the authorization is terminated or revoked.     Resp Syncytial Virus by PCR NEGATIVE NEGATIVE Final    Comment: (NOTE) Fact Sheet for Patients: bloggercourse.com  Fact Sheet for Healthcare Providers: seriousbroker.it  This test is not yet approved or cleared by the [place]  FDA and has been authorized for detection and/or diagnosis of SARS-CoV-2 by FDA under an Emergency Use Authorization (EUA). This EUA will remain in effect (meaning this test can be used) for the duration of the COVID-19 declaration under Section 564(b)(1) of the Act, 21 U.S.C. section 360bbb-3(b)(1), unless the authorization is terminated or revoked.  Performed at [facility], [address], [place], [place] [address]      Radiological Exams on Admission:   Assessment/Plan Principal Problem:   Nausea vomiting and diarrhea Active Problems:   Elevated lactic acid level   Chest pain    AKI (acute kidney injury) (HCC)   Essential hypertension   Type 2 diabetes mellitus with diabetic neuropathy, with long-term current use of insulin  (HCC)   Hypomagnesemia   Assessment and Plan:  Nausea vomiting and diarrhea: Patient has severe nausea, vomiting, diarrhea, but no fever or leukocytosis.  No abdominal pain.  Possibly due to viral gastroenteritis.  Patient is clinically  dehydrated -Placing tele bed for observation -As needed Zofran  -IV fluid: 2 L LR, then 125 cc/h -Follow-up C. difficile and GI pathogen panel -f/u Ct-abd/pelvis which is ordered by EDP  Elevated lactic acid level: Lactic acid 7.6.  No fever or leukocytosis, clinically does not seem to have sepsis.  Possibly due to dehydration -IV fluids above -Trend lactic acid level  Chest pain: May be due to demand ischemia.  No ischemic change on EKG. -Aspirin  -Trend troponin -As needed nitroglycerin  and fentanyl  for chest pain -Check A1c, FLP  AKI (acute kidney injury) (HCC): Due to dehydration -Hold Hyzaar -IV fluid as above -Follow-up with BMP  Essential hypertension -IV hydralazine  as needed -Continue metoprolol , amlodipine  -Hold Hyzaar due to AKI  Type 2 diabetes mellitus with diabetic neuropathy, with long-term current use of insulin  (HCC): Recent A1c 5.2, well-controlled.  Patient is taking metformin , Ozempic , glargine insulin  16 units daily -Sliding scale insulin  -Glargine insulin  9 unit daily  Hypomagnesemia: Magnesium  of 1.4 -Repleted magnesium  with 2 g magnesium  sulfate.       DVT ppx: SQ Lovenox   Code Status: Full code    Family Communication:     not done, no family member is at bed side.        Disposition Plan:  Anticipate discharge back to previous environment  Consults called:  none  Admission status and Level of care: Telemetry Cardiac:    for obs      Dispo: The patient is from: Home              Anticipated d/c is to: Home              Anticipated d/c date is: 1 day               Patient currently is not medically stable to d/c.    Severity of Illness:  The appropriate patient status for this patient is OBSERVATION. Observation status is judged to be reasonable and necessary in order to provide the required intensity of service to ensure the patient's safety. The patient's presenting symptoms, physical exam findings, and initial radiographic and laboratory data in the context of their medical condition is felt to place them at decreased risk for further clinical deterioration. Furthermore, it is anticipated that the patient will be medically stable for discharge from the hospital within 2 midnights of admission.        Date of Service 11/02/2023    [facility]   If 7PM-7AM, please contact night-coverage [email or web address] 11/02/2023, 12:19 AM

## 2023-11-01 NOTE — ED Triage Notes (Signed)
 Patient ambulatory to triage with steady gait, without difficulty or distress noted; pt reports N/V today; denies abd pain, st no fever or cold symptoms; reports diarrhea yesterday only

## 2023-11-02 DIAGNOSIS — R112 Nausea with vomiting, unspecified: Secondary | ICD-10-CM | POA: Diagnosis not present

## 2023-11-02 DIAGNOSIS — R197 Diarrhea, unspecified: Secondary | ICD-10-CM | POA: Diagnosis not present

## 2023-11-02 LAB — CBC
HCT: 30.9 % — ABNORMAL LOW (ref 36.0–46.0)
Hemoglobin: 10.4 g/dL — ABNORMAL LOW (ref 12.0–15.0)
MCH: 29.9 pg (ref 26.0–34.0)
MCHC: 33.7 g/dL (ref 30.0–36.0)
MCV: 88.8 fL (ref 80.0–100.0)
Platelets: 200 K/uL (ref 150–400)
RBC: 3.48 MIL/uL — ABNORMAL LOW (ref 3.87–5.11)
RDW: 12.2 % (ref 11.5–15.5)
WBC: 7.1 K/uL (ref 4.0–10.5)
nRBC: 0 % (ref 0.0–0.2)

## 2023-11-02 LAB — URINALYSIS, ROUTINE W REFLEX MICROSCOPIC
Bacteria, UA: NONE SEEN
Bilirubin Urine: NEGATIVE
Glucose, UA: NEGATIVE mg/dL
Hgb urine dipstick: NEGATIVE
Ketones, ur: 20 mg/dL — AB
Leukocytes,Ua: NEGATIVE
Nitrite: NEGATIVE
Protein, ur: 30 mg/dL — AB
RBC / HPF: 0 RBC/hpf (ref 0–5)
Specific Gravity, Urine: 1.016 (ref 1.005–1.030)
pH: 5 (ref 5.0–8.0)

## 2023-11-02 LAB — LACTIC ACID, PLASMA
Lactic Acid, Venous: 1.9 mmol/L (ref 0.5–1.9)
Lactic Acid, Venous: 6.3 mmol/L (ref 0.5–1.9)

## 2023-11-02 LAB — BASIC METABOLIC PANEL WITH GFR
Anion gap: 11 (ref 5–15)
BUN: 31 mg/dL — ABNORMAL HIGH (ref 8–23)
CO2: 24 mmol/L (ref 22–32)
Calcium: 8.9 mg/dL (ref 8.9–10.3)
Chloride: 101 mmol/L (ref 98–111)
Creatinine, Ser: 1.08 mg/dL — ABNORMAL HIGH (ref 0.44–1.00)
GFR, Estimated: 57 mL/min — ABNORMAL LOW (ref >60)
Glucose, Bld: 89 mg/dL (ref 70–99)
Potassium: 3.7 mmol/L (ref 3.5–5.1)
Sodium: 136 mmol/L (ref 135–145)

## 2023-11-02 LAB — PHOSPHORUS: Phosphorus: 4 mg/dL (ref 2.5–4.6)

## 2023-11-02 LAB — CBG MONITORING, ED
Glucose-Capillary: 110 mg/dL — ABNORMAL HIGH (ref 70–99)
Glucose-Capillary: 92 mg/dL (ref 70–99)

## 2023-11-02 LAB — MAGNESIUM: Magnesium: 2.2 mg/dL (ref 1.7–2.4)

## 2023-11-02 LAB — TROPONIN I (HIGH SENSITIVITY)
Troponin I (High Sensitivity): 4 ng/L (ref <18)
Troponin I (High Sensitivity): 5 ng/L (ref <18)

## 2023-11-02 MED ORDER — MAGNESIUM SULFATE 2 GM/50ML IV SOLN
2.0000 g | Freq: Once | INTRAVENOUS | Status: AC
  Administered 2023-11-02: 2 g via INTRAVENOUS
  Filled 2023-11-02: qty 50

## 2023-11-02 MED ORDER — BUTALBITAL-APAP-CAFFEINE 50-325-40 MG PO TABS
1.0000 | ORAL_TABLET | Freq: Four times a day (QID) | ORAL | Status: DC | PRN
  Administered 2023-11-02: 1 via ORAL
  Filled 2023-11-02: qty 1

## 2023-11-02 MED ORDER — INSULIN GLARGINE-YFGN 100 UNIT/ML ~~LOC~~ SOLN: 8.0000 [IU] | Freq: Every day | SUBCUTANEOUS | Status: DC

## 2023-11-02 NOTE — ED Notes (Signed)
 Pt ambulating to bathroom.

## 2023-11-02 NOTE — Discharge Summary (Signed)
 [facility] Discharge Summary   Patient: Belinda Soto  PCP: Pcp, No  Date of admission: 11/01/2023   Date of discharge:  11/02/2023     Discharge Diagnoses:  Principal Problem:   Nausea vomiting and diarrhea Active Problems:   Elevated lactic acid level   Chest pain   AKI (acute kidney injury) (HCC)   Essential hypertension   Type 2 diabetes mellitus with diabetic neuropathy, with long-term current use of insulin  (HCC)   Hypomagnesemia   Admitted From: Home Disposition:  Home   Recommendations for Outpatient Follow-up:  Follow with PCP in 1 week, needs referral to GYN for right ovarian cyst and fibroid uterus. Follow up LABS/TEST:     Follow-up Information     PCP Follow up in 1 week(s).                 Diet recommendation: Cardiac and Carb modified diet  Activity: The patient is advised to gradually reintroduce usual activities, as tolerated  Discharge Condition: stable  Code Status: Full code   History of present illness: As per the H and P dictated on admission Hospital Course:  Belinda Soto is a 68 y.o. female with medical history significant of hypertension, diabetes mellitus, peripheral neuropathy, anemia, psoriasis, kidney stone, varicose vein, who presents with nausea, vomiting, diarrhea and chest pain.   Patient states that she started having nausea, vomiting, diarrhea since yesterday afternoon when she was at work.  She has had numerous times of nonbilious nonbloody vomiting, and 6 times of watery diarrhea.  No sick contact.  Patient initially did not have chest pain, but developed chest pain in the ED.  Her chest pain is located in the substernal area, pressure-like, mild to moderate, nonradiating.  Not associated with shortness od breath.  No cough, fever or chills.  No symptoms of UTI.  Denies recent long distance traveling.  No tenderness in the calf areas. Pt called her doctor and they prescribed her Zofran . She reports after taking  a tablet of 8 mg, her vomiting has improved.     Data reviewed independently and ED Course:  WBC 9.7, mag 1.4, phos 5.4, K 4.2, negative PCR for COVID, flu and RSV, AKI Cr 1.33, BUN 29, GFR 44 (recent baseline of 0.90 on 03/24/2022),  CK 48, lactic acid 7.6, temperature normal, BP 138/80, HR 106, 93, RR 18, oxygen saturation 99% on room air.    Assessment and Plan:   # Acute gastroenteritis Patient presented with severe nausea, vomiting and diarrhea but no fever or leukocytosis.  No abdominal pain.  Possibly due to viral gastroenteritis.  Patient was clinically dehydrated. S/p IV fluid 2 L LR bolus given and started on maintenance IV fluid 125 cc/h.  Symptomatic treatment with Zofran  given.  Gradually advance diet, patient was tolerating well.  Denied any vomiting and no diarrhea since admission.   CT a/p: Mild thickened fold in the stomach and jejunum consistent with gastroenteritis.  Diverticulosis without diverticulitis.  Hepatic steatosis.  Nonobstructive nephrolithiasis. 3.4 x 3.1 cm right ovarian cyst. Recommend follow-up pelvic ultrasound. 1.9 cm intramural fibroid of the uterine fundus to the right. Stable chronic lung base nodules. Clinically patient was stable, medically optimized and cleared for discharge.  Patient agreed with the discharge planning.  # Elevated lactic acid level: Lactic acid 7.6.  No fever or leukocytosis, clinically does not seem to have sepsis.  Possibly due to dehydration and malnutrition.  Lactic acid improved and metabolic acidosis resolved.  Currently asymptomatic  and stable to discharge. # Chest pain: May be due to demand ischemia.  No ischemic change on EKG. S/p Aspirin .  Troponin negative x 2, chest pressure resolved but could be due to persistent vomiting.  Currently asymptomatic. # AKI (acute kidney injury): Due to dehydration. Held Hyzaar during hospital stay.  Resumed on discharge.  AKI resolved after IV fluid.  Patient was advised to continue oral  hydration. # Essential hypertension: Resumed home medications on discharge.  Hyzaar was held during AKI which has been resolved so resumed on discharge as well.  Patient was advised to monitor BP at home and follow with PCP to titrate medication accordingly. # Type 2 diabetes mellitus with diabetic neuropathy, with long-term current use of insulin : Recent A1c 5.2, well-controlled.  Patient is taking metformin , Ozempic , glargine insulin  16 units daily. S/p sliding scale insulin  And Glargine insulin  9 unit daily.  Resumed home dose insulin  on discharge.  Patient was advised to monitor CBG at home and continue diabetic diet and follow with PCP. # Hypomagnesemia: Magnesium  of 1.4, s/p Repleted magnesium  with 2 g magnesium  sulfate.  Resolved mag 2.2 # Right ovarian cyst and uterine fibroid, incidental finding on CT A/P 3.4 x 3.1 cm right ovarian cyst. Recommend follow-up pelvic ultrasound.  1.9 cm intramural fibroid of the uterine fundus to the right.  Patient was recommended to follow-up with GYN for pelvic versus transvaginal sonogram and further management as an outpatient. #Lung base nodule: Stable chronic lung base nodules.  Follow-up with PCP.   Body mass index is 26.13 kg/m.  Nutrition Interventions:  Patient was ambulatory without any assistance. On the day of the discharge the patient's vitals were stable, and no other acute medical condition were reported by patient. the patient was felt safe to be discharge at Home.  Consultants: None Procedures: None  Discharge Exam: General: Appear in no distress, no Rash; Oral Mucosa Clear, moist. Cardiovascular: S1 and S2 Present, no Murmur, Respiratory: normal respiratory effort, Bilateral Air entry present and no Crackles, no wheezes Abdomen: Bowel Sound present, Soft and no tenderness, no hernia Extremities: no Pedal edema, no calf tenderness Neurology: alert and oriented to time, place, and person affect appropriate.  Filed Weights    11/01/23 1924  Weight: 71.2 kg   Vitals:   11/02/23 0900 11/02/23 0924  BP: (!) 162/86   Pulse: 99   Resp: 18   Temp:  98.2 F (36.8 C)  SpO2: 100%     DISCHARGE MEDICATION: Allergies as of 11/02/2023       Reactions   Sulfa Antibiotics Swelling   Facial swelling Lip swells.   Codeine Nausea And Vomiting   Morphine And Codeine Itching        Medication List     STOP taking these medications    conjugated estrogens  vaginal cream Commonly known as: PREMARIN    meloxicam  7.5 MG tablet Commonly known as: MOBIC        TAKE these medications    amitriptyline  25 MG tablet Commonly known as: ELAVIL  Take 25 mg by mouth at bedtime.   amLODipine  2.5 MG tablet Commonly known as: NORVASC  Take 1 tablet by mouth once daily   aspirin  81 MG tablet Take 81 mg by mouth daily.   Basaglar  KwikPen 100 UNIT/ML Inject 16 Units into the skin at bedtime.   calcium carbonate 500 MG chewable tablet Commonly known as: TUMS - dosed in mg elemental calcium Chew 1 tablet by mouth as needed for indigestion or heartburn.   ferrous fumarate  325 (  106 Fe) MG Tabs tablet Commonly known as: HEMOCYTE - 106 mg FE Take 1 tablet (106 mg of iron total) by mouth daily.   gabapentin  600 MG tablet Commonly known as: NEURONTIN  Take 2 tablets 3 times daily. For weaning, decrease daily total dose by 1 tablet every 4 days until stopped.   hydrocortisone 2.5 % ointment Apply topically.   losartan -hydrochlorothiazide  100-25 MG tablet Commonly known as: HYZAAR Take 1 tablet by mouth once daily   metFORMIN  1000 MG tablet Commonly known as: GLUCOPHAGE  Take 1,000 mg by mouth 2 (two) times daily.   metoprolol  tartrate 100 MG tablet Commonly known as: LOPRESSOR  Take 1 tablet by mouth twice daily   NovoFine 32G X 6 MM Misc Generic drug: Insulin  Pen Needle USE WITH VICTOZA  ONCE DAILY   Insulin  Pen Needle 32G X 4 MM Misc Use as directed with victoza  daily   OneTouch Delica Plus Lancet30G  Misc Use 1 lancet to check glucose level with glucose meter twice daily and prn   OneTouch Verio test strip Generic drug: glucose blood Use 1 test strip to check glucose with glucose meter twice daily and prn.   Ozempic  (1 MG/DOSE) 4 MG/3ML Sopn Generic drug: Semaglutide  (1 MG/DOSE) INJECT 1MG  SUBCUTANEOUSLY ONCE WEEKLY AS DIRECTED   pantoprazole  40 MG tablet Commonly known as: PROTONIX  Take 1 tablet by mouth once daily   pregabalin  75 MG capsule Commonly known as: LYRICA  Take 1 capsule (75 mg total) by mouth 2 (two) times daily.   VITAMIN D  PO Take by mouth.       Allergies  Allergen Reactions   Sulfa Antibiotics Swelling    Facial swelling Lip swells.   Codeine Nausea And Vomiting   Morphine And Codeine Itching   Discharge Instructions     Call MD for:  difficulty breathing, headache or visual disturbances   Complete by: As directed    Call MD for:  extreme fatigue   Complete by: As directed    Call MD for:  persistant dizziness or light-headedness   Complete by: As directed    Call MD for:  persistant nausea and vomiting   Complete by: As directed    Call MD for:  temperature >100.4   Complete by: As directed    Diet - low sodium heart healthy   Complete by: As directed    Discharge instructions   Complete by: As directed    Follow with PCP in 1 week, needs referral to GYN for right ovarian cyst and fibroid uterus.   Increase activity slowly   Complete by: As directed        The results of significant diagnostics from this hospitalization (including imaging, microbiology, ancillary and laboratory) are listed below for reference.    Significant Diagnostic Studies: CT ABDOMEN PELVIS W CONTRAST Result Date: 11/02/2023 CLINICAL DATA:  Abdominal pain and diarrhea. EXAM: CT ABDOMEN AND PELVIS WITH CONTRAST TECHNIQUE: Multidetector CT imaging of the abdomen and pelvis was performed using the standard protocol following bolus administration of intravenous  contrast. RADIATION DOSE REDUCTION: This exam was performed according to the departmental dose-optimization program which includes automated exposure control, adjustment of the mA and/or kV according to patient size and/or use of iterative reconstruction technique. CONTRAST:  75mL OMNIPAQUE  IOHEXOL  300 MG/ML  SOLN COMPARISON:  CT without contrast 03/25/2021 and 07/23/2014. FINDINGS: Lower chest: Stable 6 mm chronic left lower lobe nodule again noted laterally on 4:29, consistent with a benign entity. Stable 3 mm chronic right lower lobe fissural nodule on  4:10, also benign. Lung bases are otherwise clear.  The cardiac size is normal. Hepatobiliary: No focal liver abnormality is seen. No gallstones, gallbladder wall thickening, or biliary dilatation. There is mild-to-moderate hepatic steatosis. Pancreas: No abnormality. Spleen: No abnormality. Adrenals/Urinary Tract: There is no adrenal mass. There are nonobstructive caliceal stones in the mid and lower pole left kidney, ranging from punctate size up to 8 mm. There are occasional punctate nonobstructive caliceal stones on the right. There are no ureteral stones or hydronephrosis bilaterally. There is fetal lobation of both kidneys but no mass enhancement. Symmetric excretion on the delayed images.  Unremarkable bladder. Stomach/Bowel: There are mild thickened folds in the stomach and jejunum consistent with gastroenteritis. No inflammatory changes or small-bowel obstruction are seen. The appendix is well seen and is normal caliber. There are colonic diverticula. No colonic dilatation, wall thickening or inflammatory change. Vascular/Lymphatic: No significant vascular findings are present. No enlarged abdominal or pelvic lymph nodes. Reproductive: 1.9 cm intramural fibroid of the uterine fundus to the right. The uterus is not significantly enlarged. Left ovary is unremarkable. There is a homogeneous thin walled 3.4 x 3.1 cm right ovarian cyst, Hounsfield density of  15.4. Recommend follow-up pelvic ultrasound. Reference: JACR 2020 Feb;17(2):248-254. On the last CT, this measured 2.9 x 2.6 cm. No complex features have developed in the interval. Other: No abdominal wall hernia. There is mild rectus diastasis at the umbilicus. No abdominopelvic ascites. Musculoskeletal: Multilevel degenerative disc and facet disease lumbar spine. No acute or other significant osseous findings. IMPRESSION: 1. Mild thickened folds in the stomach and jejunum consistent with gastroenteritis. No inflammatory changes or small-bowel obstruction. 2. Diverticulosis without evidence of diverticulitis. 3. Hepatic steatosis. 4. Nonobstructive nephrolithiasis. 5. 3.4 x 3.1 cm right ovarian cyst. Recommend follow-up pelvic ultrasound. 6. 1.9 cm intramural fibroid of the uterine fundus to the right. 7. Stable chronic lung base nodules. Electronically Signed   By: Francis Quam M.D.   On: 11/02/2023 01:20    Microbiology: Recent Results (from the past 240 hours)  Resp panel by RT-PCR (RSV, Flu A&B, Covid) Anterior Nasal Swab     Status: None   Collection Time: 11/01/23  7:25 PM   Specimen: Anterior Nasal Swab  Result Value Ref Range Status   SARS Coronavirus 2 by RT PCR NEGATIVE NEGATIVE Final    Comment: (NOTE) SARS-CoV-2 target nucleic acids are NOT DETECTED.  The SARS-CoV-2 RNA is generally detectable in upper respiratory specimens during the acute phase of infection. The lowest concentration of SARS-CoV-2 viral copies this assay can detect is 138 copies/mL. A negative result does not preclude SARS-Cov-2 infection and should not be used as the sole basis for treatment or other patient management decisions. A negative result may occur with  improper specimen collection/handling, submission of specimen other than nasopharyngeal swab, presence of viral mutation(s) within the areas targeted by this assay, and inadequate number of viral copies(<138 copies/mL). A negative result must be  combined with clinical observations, patient history, and epidemiological information. The expected result is Negative.  Fact Sheet for Patients:  bloggercourse.com  Fact Sheet for Healthcare Providers:  seriousbroker.it  This test is no t yet approved or cleared by the [place]  FDA and  has been authorized for detection and/or diagnosis of SARS-CoV-2 by FDA under an Emergency Use Authorization (EUA). This EUA will remain  in effect (meaning this test can be used) for the duration of the COVID-19 declaration under Section 564(b)(1) of the Act, 21 U.S.C.section 360bbb-3(b)(1), unless the authorization is  terminated  or revoked sooner.       Influenza A by PCR NEGATIVE NEGATIVE Final   Influenza B by PCR NEGATIVE NEGATIVE Final    Comment: (NOTE) The Xpert Xpress SARS-CoV-2/FLU/RSV plus assay is intended as an aid in the diagnosis of influenza from Nasopharyngeal swab specimens and should not be used as a sole basis for treatment. Nasal washings and aspirates are unacceptable for Xpert Xpress SARS-CoV-2/FLU/RSV testing.  Fact Sheet for Patients: bloggercourse.com  Fact Sheet for Healthcare Providers: seriousbroker.it  This test is not yet approved or cleared by the [place]  FDA and has been authorized for detection and/or diagnosis of SARS-CoV-2 by FDA under an Emergency Use Authorization (EUA). This EUA will remain in effect (meaning this test can be used) for the duration of the COVID-19 declaration under Section 564(b)(1) of the Act, 21 U.S.C. section 360bbb-3(b)(1), unless the authorization is terminated or revoked.     Resp Syncytial Virus by PCR NEGATIVE NEGATIVE Final    Comment: (NOTE) Fact Sheet for Patients: bloggercourse.com  Fact Sheet for Healthcare Providers: seriousbroker.it  This test is not yet  approved or cleared by the [place]  FDA and has been authorized for detection and/or diagnosis of SARS-CoV-2 by FDA under an Emergency Use Authorization (EUA). This EUA will remain in effect (meaning this test can be used) for the duration of the COVID-19 declaration under Section 564(b)(1) of the Act, 21 U.S.C. section 360bbb-3(b)(1), unless the authorization is terminated or revoked.  Performed at [facility], [address] [place], [place] [address]      Labs: CBC: Recent Labs  Lab 11/01/23 1925 11/02/23 0410  WBC 9.9 7.1  NEUTROABS 8.6*  --   HGB 12.6 10.4*  HCT 38.8 30.9*  MCV 90.4 88.8  PLT 206 200   Basic Metabolic Panel: Recent Labs  Lab 11/01/23 1925 11/02/23 0410  NA 139 136  K 4.2 3.7  CL 98 101  CO2 15* 24  GLUCOSE 110* 89  BUN 29* 31*  CREATININE 1.33* 1.08*  CALCIUM 9.7 8.9  MG 1.4* 2.2  PHOS 5.1* 4.0   Liver Function Tests: Recent Labs  Lab 11/01/23 1925  AST 37  ALT 19  ALKPHOS 97  BILITOT 0.9  PROT 8.4*  ALBUMIN 4.6   Recent Labs  Lab 11/01/23 1925  LIPASE 31   No results for input(s): AMMONIA in the last 168 hours. Cardiac Enzymes: Recent Labs  Lab 11/01/23 2208  CKTOTAL 48   BNP (last 3 results) No results for input(s): BNP in the last 8760 hours. CBG: Recent Labs  Lab 11/01/23 2359 11/02/23 0734  GLUCAP 92 110*    Time spent: 35 minutes  Signed:  Elvan Sor  [facility] 11/02/2023 10:47 AM

## 2023-11-06 LAB — BLOOD GAS, VENOUS
Acid-base deficit: 7.1 mmol/L — ABNORMAL HIGH (ref 0.0–2.0)
Bicarbonate: 18.7 mmol/L — ABNORMAL LOW (ref 20.0–28.0)
Patient temperature: 37
pCO2, Ven: 38 mmHg — ABNORMAL LOW (ref 44–60)
pH, Ven: 7.3 (ref 7.25–7.43)

## 2023-11-11 ENCOUNTER — Other Ambulatory Visit: Payer: Self-pay | Admitting: Nurse Practitioner

## 2023-11-11 DIAGNOSIS — Z760 Encounter for issue of repeat prescription: Secondary | ICD-10-CM

## 2023-12-10 ENCOUNTER — Encounter: Payer: Self-pay | Admitting: Internal Medicine

## 2023-12-10 ENCOUNTER — Other Ambulatory Visit: Payer: Self-pay | Admitting: Urgent Care

## 2023-12-10 DIAGNOSIS — Z1231 Encounter for screening mammogram for malignant neoplasm of breast: Secondary | ICD-10-CM

## 2024-01-29 ENCOUNTER — Other Ambulatory Visit: Payer: Self-pay | Admitting: Nurse Practitioner

## 2024-01-29 DIAGNOSIS — E1140 Type 2 diabetes mellitus with diabetic neuropathy, unspecified: Secondary | ICD-10-CM

## 2024-02-11 ENCOUNTER — Other Ambulatory Visit: Payer: Self-pay | Admitting: Nurse Practitioner

## 2024-02-11 DIAGNOSIS — E1140 Type 2 diabetes mellitus with diabetic neuropathy, unspecified: Secondary | ICD-10-CM

## 2024-02-21 ENCOUNTER — Ambulatory Visit: Payer: BC Managed Care – PPO | Admitting: Dermatology

## 2024-03-17 ENCOUNTER — Ambulatory Visit: Admitting: Dermatology

## 2024-04-24 ENCOUNTER — Other Ambulatory Visit: Payer: Self-pay | Admitting: Internal Medicine

## 2024-04-24 DIAGNOSIS — Z1231 Encounter for screening mammogram for malignant neoplasm of breast: Secondary | ICD-10-CM

## 2024-04-25 ENCOUNTER — Other Ambulatory Visit: Payer: Self-pay | Admitting: Internal Medicine

## 2024-04-25 DIAGNOSIS — Z780 Asymptomatic menopausal state: Secondary | ICD-10-CM

## 2024-05-16 ENCOUNTER — Other Ambulatory Visit: Payer: Self-pay | Admitting: Internal Medicine

## 2024-05-16 DIAGNOSIS — R519 Headache, unspecified: Secondary | ICD-10-CM

## 2024-05-16 DIAGNOSIS — R413 Other amnesia: Secondary | ICD-10-CM

## 2024-09-03 ENCOUNTER — Other Ambulatory Visit: Payer: Self-pay | Admitting: Internal Medicine

## 2024-09-03 DIAGNOSIS — M545 Low back pain, unspecified: Secondary | ICD-10-CM

## 2024-09-05 ENCOUNTER — Other Ambulatory Visit: Payer: Self-pay | Admitting: Internal Medicine

## 2024-09-05 DIAGNOSIS — F02A0 Dementia in other diseases classified elsewhere, mild, without behavioral disturbance, psychotic disturbance, mood disturbance, and anxiety: Secondary | ICD-10-CM

## 2024-09-13 ENCOUNTER — Ambulatory Visit
Admission: RE | Admit: 2024-09-13 | Discharge: 2024-09-13 | Disposition: A | Discharge status: Home / Self Care | Payer: Self-pay | Source: Ambulatory Visit | Attending: Internal Medicine | Admitting: Internal Medicine

## 2024-09-13 DIAGNOSIS — F02A0 Dementia in other diseases classified elsewhere, mild, without behavioral disturbance, psychotic disturbance, mood disturbance, and anxiety: Secondary | ICD-10-CM | POA: Insufficient documentation

## 2024-09-13 DIAGNOSIS — F0390 Unspecified dementia without behavioral disturbance: Secondary | ICD-10-CM | POA: Insufficient documentation

## 2024-10-03 ENCOUNTER — Ambulatory Visit
Admission: RE | Admit: 2024-10-03 | Discharge: 2024-10-03 | Disposition: A | Attending: Internal Medicine | Admitting: Internal Medicine

## 2024-10-03 ENCOUNTER — Ambulatory Visit
Admission: RE | Admit: 2024-10-03 | Discharge: 2024-10-03 | Disposition: A | Source: Ambulatory Visit | Attending: Internal Medicine

## 2024-10-03 DIAGNOSIS — M545 Low back pain, unspecified: Secondary | ICD-10-CM | POA: Diagnosis present

## 2024-10-14 NOTE — Progress Notes (Signed)
 Ref Provider: Sampson Scales An* PCP: Entzminger, Scales [place], MD  Assessment and Plan:   In most patients we give written parts of assessment and plan to patient under Patient Instructions/After Visit Summary. So some parts are directed to patient.  Dear Ms. Caelin Rosen, It was our pleasure to participate in your care in person. We have typed up brief summary of what we discussed. Assessment & Plan Vascular dementia Gradual onset of memory loss and cognitive decline over the past year, consistent with vascular dementia. MRI shows severe progression of white matter disease, focal cystic encephalomalacia near the corpus callosum, and extensive involvement of deep gray nuclei, especially in the thalami, with advanced T2 and flair signal changes. Symptoms include memory loss, difficulty following conversations, and poor personal hygiene. Differential diagnosis includes Alzheimer's dementia, but imaging and clinical presentation suggest vascular dementia. We will order labs to check CBC, CMP, TSH, Vit B12, Vit B1, Vit D, folate, Treponema Pallidum (syphilis) screening cascade.   Discussed important lifestyle modifications to improve brain health and slow down progression of cognitive issues such as:  - Eating a healthy diet. Be sure to have a diet with a variety of green leafy vegetables, fruits, nuts, and other whole foods.  - Remaining physically active. Exercise can be very beneficial in many neurological conditions. - Ensuring plenty of good quality sleep. A good night's sleep is very important for many neurological functions including memory. We discussed role of sleep in converting short term memory in to long term memory. - Remaining social active. Be sure to make efforts to have meaningful connections with family and friends. Another great option to improve social activity is to volunteer.  - Reduce stress. Headspace and Calm are apps that can help teach and guide meditation  with short easy to use sessions. Meditation has shown benefits in multiple neurological conditions including stress, focus, attention, insomnia, etc. For more information visit www.headspace.com or www.calm.com  For Mild Cognitive Impairment concerning for neurodegenerative disorder, we will order:  Phosphorylated Tau 217 (p-Tau 217), plasma ([facility] number [id number]) Beta Amyloid 42/40 ratio, Plasma ([id number], [facility] number [id number]) APO Alzheimer's Risk ([id number], [facility] number [id number])  - Ordered speech therapy consult for cognitive training. - Advised on lifestyle modifications: physical activity, healthy diet, social interaction, and stress management. - Recommended MIND or Mediterranean diet. - Advised on maintaining good sleep hygiene and treating sleep apnea if present.  Acute right thalamic infarct Acute right thalamic infarct identified on MRI, likely occurred within two weeks prior to imaging. Symptoms include memory issues and cognitive decline. Risk factors include hypertension and diabetes. Current medications include clopidogrel and rosuvastatin for secondary stroke prevention. As part of secondary stroke prevention, we advised patient that she should have: Hypertension target range Systolic <140 (<130 in diabetics) / Diastolic 70-80, Lipid range - fasting LDL < 70 mg/dL and checked every 6 months, Exercise 30 minutes daily as tolerated. Have aggressive oral care / maintain good teeth cleaning and good gum health. - Frailty is a risk factor for stroke, so get strong. Avoid pesticides, insecticides, microplastic, forever chemicals, etc. Spend time in nature, volunteer, meditate, learn new skills, dance, music, etc. Statin medications are useful in secondary stroke prevention. Smoking and heavy alcohol use should be avoided. OSA is a modifiable risk factor for stroke as well. Patient's with low folate level, may benefit from folic acid  supplementation.  Patient should read about  Life's Simple Seven by American Heart Association. 1) Quit/cut back on smoking & avoid excessive alcohol consumption (only  applicable in patients who use nicotine and excessive alcohol) 2) Body mass index less than 25 kg/m2 3) Physical activity at least 150 minutes (Moderate intensity or 75 minutes (vigorous) each week 4) Eat healthy diet 5) Total cholesterol < 200 mg/dL, LDL < 70 mg/dL 6) Blood pressure < 879/19 mm Hg 7) Fasting blood glucose < 100 mg/dL 8) Have aggressive oral care / maintain good teeth cleaning and good gum health. - Frailty is a risk factor for stroke, so get strong. Avoid pesticides, insecticides, microplastic, forever chemicals, etc. Spend time in nature, volunteer, meditate, learn new skills, dance, music, etc.  Patient should be aware of and recognize stroke symptoms. Patient is encouraged to call 911, if any of these symptoms occure. - Sudden numbness or weakness of the face, arm or leg, especially on one side of the body - Sudden confusion, trouble speaking or understanding - Sudden trouble seeing in one or both eyes - Sudden trouble walking, dizziness, loss of balance or coordination - Sudden severe headache with no known cause For further resources access www.stroke.org  - Continue clopidogrel and rosuvastatin for secondary stroke prevention. - Advised on lifestyle modifications to manage vascular risk factors, including blood pressure and diabetes control.  Vascular parkinsonism Features of Parkinsonism, including shuffled steps and decreased right arm swing, likely due to vascular parkinsonism secondary to extensive white matter disease. High fall risk due to gait issues and deconditioning. - Recommended physical therapy for strength, conditioning, and balance training. - Advised on fall prevention strategies, including environmental modifications and wearing appropriate footwear. - Encouraged regular practice of physical therapy exercises at home - There  are a few interventions the patient should consider to prevent falls: Be careful, Avoid high heel footwear, Use appropriate assistive device, Avoid hazards in your path,  (toys, pets, wires, etc.), Have good light when you walk, Use a nonskid shower mat/shower chair, Use handrails and grab bars. Check out www.stopfalls.org for more information.  Suspected obstructive sleep apnea Suspected due to snoring and Mallampati score of four. Untreated sleep apnea can increase vascular risk and impact cognitive function. - Order a three-night at-home sleep study to evaluate for obstructive sleep apnea. - Advised on the importance of treating sleep apnea to reduce vascular risk.   Follow up in six months with Allyson Stallion, FNP-BC   09/13/2024 MRI brain Without contrast IMPRESSION:  1. Abnormal brain MRI with substantially progressed since 2017 white matter  disease (including marginal corpus callosum involvement), and advanced signal  changes throughout the deep gray nuclei and pons. And suggestion of a  superimposed Acute punctate right thalamic infarct. No hemorrhage or mass  effect.  2. Top differential include demyelinating conditions (both inflammatory and  metabolic related), vasculitis, and other accelerated small vessel disease.  Recommend Neurology consultation.  3. Associated cerebral volume loss with probable ex vacuo ventricular  enlargement (Evans index 0.29, callosal angle 91 degrees).   Return in about 6 months (around 04/13/2025) for Allyson Broody NP. This note has been created using automated tools and reviewed for accuracy by ELIAS GREGORY RODRIGUEZ.  History and Present Illness:   Belinda Soto is a right handed 69 y.o. female  here for evaluation of dementia, referred by Sampson Scales An*.  History of Present Illness Belinda Soto is a 69 year old female with diabetes, hypertension, and a history of stroke who presents with memory loss and cognitive decline. She is  accompanied by her daughter, Harris, who is her primary caregiver.  Over the past year, she has  experienced progressive memory loss and cognitive decline. Her daughter reports significant short-term memory issues, such as forgetting conversations within minutes, and difficulty following conversations. She has become withdrawn, moody, and prone to crying, with a noticeable decline in personal hygiene.  She has a history of a stroke, with an MRI on September 13, 2024, showing an acute right thalamic infarct. The MRI also revealed severe progression of white matter disease compared to 2017, with advanced T2 and FLAIR signal changes, focal cystic encephalomalacia near the corpus callosum, and extensive involvement of the deep gray nuclei, especially in the thalami. Additional brain stem signal abnormalities were noted.  Her past medical history includes diabetes, hypertension, migraines, and mitral valve disease. She has been experiencing issues with gait, including getting lost while driving, although there have been no wandering episodes or gait impairment noted. She does not engage in cooking, cleaning, or yard work, with her husband taking over these tasks.  She has a family history of Alzheimer's disease, with her grandmother having been diagnosed with it. She lives with her husband and receives primary caregiving from her daughter and spouse.  Her current medications include metformin  for diabetes, losartan  for hypertension, rosuvastatin for cholesterol, and clopidogrel for stroke prevention. She is also on bupropion for depression. She is not taking aspirin , as she is on clopidogrel.  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 Since retirement, she has experienced increased inactivity and weight loss, dropping from 160 pounds in May 2023 to 125 pounds currently. She reports a lack of appetite and has been supplementing with nutritional  drinks like Boost.   I reviewed labs, imaging, and notes in [place], [place], and from outside providers, if available.   Results Radiology Brain MRI (09/13/2024): Advanced microvascular ischemic changes, severe progression of white matter disease since 2017, focal cystic encephalomalacia near the corpus callosum, extensive deep gray nuclei involvement including thalami with advanced T2 and FLAIR signal abnormalities, additional brainstem signal changes, confluent periventricular white matter disease, prior infarcts, cortical sparing.  Magnesium  250 mg General Exam:   Vitals:   10/14/24 1056  Weight: 56.7 kg (125 lb)  Height: 165.1 cm (5' 5)    Body mass index is 20.8 kg/m.  Neurological exam appropriate for the patient's condition was performed. Physical Exam MEASUREMENTS: Weight- 125. HEENT: Mallampati score of 4. CARDIOVASCULAR: Heart sounds S1, normal rate and rhythm. NEUROLOGICAL: Pupils equal, round, reactive to light and accommodation. Face symmetric, tongue midline, 5/5 strength. 5/5 strength in bilateral upper and lower extremities. Shuffled steps, decreased right arm swing. 3+ knee jerk, brachioradialis, and bicep reflexes bilaterally. No bradykinesia.  Neurological Exam:   Physical Exam MEASUREMENTS: Weight- 125. HEENT: Mallampati score of 4. CARDIOVASCULAR: Heart sounds S1, normal rate and rhythm. NEUROLOGICAL: Pupils equal, round, reactive to light and accommodation. Face symmetric, tongue midline, 5/5 strength. 5/5 strength in bilateral upper and lower extremities. Shuffled steps, decreased right arm swing. 3+ knee jerk, brachioradialis, and bicep reflexes bilaterally. No bradykinesia.  This exam is reliable for alert and cooperative patients, it can evaluate multiple neurocognitive domains including orientation, attention, concentration, memory, language, fund of knowledge etc. Below information was reviewed by ELIAS GREGORY RODRIGUEZ.  Social Drivers  of Health   Tobacco Use: Medium Risk (10/22/2024)   Patient History    Smoking Tobacco Use: Former    Smokeless Tobacco Use: Never    Passive Exposure: Not on file  Alcohol Use: Not At Risk (09/29/2024)   AUDIT-C  Frequency of Alcohol Consumption: Never    Average Number of Drinks: Patient does not drink    Frequency of Binge Drinking: Never  Financial Resource Strain: Low Risk  (10/14/2024)   Overall Financial Resource Strain (CARDIA)    Difficulty of Paying Living Expenses: Not hard at all  Food Insecurity: No Food Insecurity (10/14/2024)   Hunger Vital Sign    Worried About Running Out of Food in the Last Year: Never true    Ran Out of Food in the Last Year: Never true  Transportation Needs: No Transportation Needs (10/14/2024)   PRAPARE - Administrator, Civil Service (Medical): No    Lack of Transportation (Non-Medical): No  Physical Activity: Not on file  Stress: Not on file  Social Connections: Not on file  Depression: Not at risk (10/14/2024)   PHQ-2    PHQ-2 Score: 0  Housing Stability: Low Risk  (10/14/2024)   Housing Stability Vital Sign    Unable to Pay for Housing in the Last Year: No    Number of Times Moved in the Last Year: 0    Homeless in the Last Year: No  Utilities: Not At Risk (10/14/2024)   AHC Utilities    Threatened with loss of utilities: No  Health Literacy: Not on file    Medications: Current Outpatient Medications on File Prior to Visit  Medication Sig Dispense Refill   acetaminophen  (TYLENOL ) 500 MG tablet Take 500 mg by mouth every 6 (six) hours     aspirin  81 MG EC tablet Take by mouth     baclofen  (LIORESAL ) 10 MG tablet      bisoproloL-hydrochlorothiazide  (ZIAC) 5-6.25 mg tablet      buPROPion (WELLBUTRIN XL) 150 MG XL tablet Take 150 mg by mouth once daily     citalopram  (CELEXA ) 20 MG tablet Take 20 mg by mouth once daily     clopidogreL (PLAVIX) 75 mg tablet Take 75 mg by mouth once daily     conjugated  estrogens  (PREMARIN ) 0.625 mg/gram vaginal cream Place vaginally     cyclobenzaprine (FLEXERIL) 10 MG tablet      ERGOCALCIFEROL , VITAMIN D2, (VITAMIN D2 ORAL) Take by mouth     hydrocortisone 2.5 % ointment Apply topically 2 (two) times daily as needed     ibuprofen (MOTRIN) 800 MG tablet Take 800 mg by mouth every 6 (six) hours as needed     insulin  DETEMIR (LEVEMIR  FLEXTOUCH) 100 unit/mL (3 mL) pen injector Inject subcutaneously at bedtime     liraglutide  (VICTOZA  2-PAK SUBQ) Inject subcutaneously     liraglutide  (VICTOZA ) 0.6 mg/0.1 mL (18 mg/3 mL) pen injector Inject 1.2 mg subcutaneously once daily     loperamide (IMODIUM A-D) 2 mg tablet TAKE 1 TABLET BY MOUTH EVERY 6 HOURS AS NEEDED FOR DIARRHEA     losartan  (COZAAR ) 100 MG tablet Take 25 mg by mouth every morning     metFORMIN  (GLUCOPHAGE ) 500 MG tablet Take 1 tablet by mouth 2 (two) times daily with meals  2   ondansetron  (ZOFRAN -ODT) 4 MG disintegrating tablet DISSOLVE 1 TABLET IN MOUTH EVERY 8 HOURS AS NEEDED     rosuvastatin (CRESTOR) 20 MG tablet Take 20 mg by mouth at bedtime     No current facility-administered medications on file prior to visit.    Past Medical History:  Past Medical History:  Diagnosis Date   Diabetes mellitus (CMS/HHS-HCC)    diagnosed 2011   Hypertension    LFT elevation 2013  Migraines    Mitral valve disease    PVCs (premature ventricular contractions)    Situational depression     Past Surgical History:  Past Surgical History:  Procedure Laterality Date   COLONOSCOPY  04/05/2004   Normal   COLONOSCOPY  03/08/2015   Entire examined colon is normal/Repeat 12yrs/PYO   ARTHROSCOPIC ROTATOR CUFF REPAIR Right 08/05/2021   BILATERAL TUBAL LIGATION     CESAREAN SECTION     Family History:  Family History  Problem Relation Name Age of Onset   High blood pressure (Hypertension) Mother     Ulcers Mother     Diabetes Father     Nephrolithiasis Father     Brain  cancer Paternal Grandmother     Colon cancer Other         Grandfather   Social History:  Social History   Socioeconomic History   Marital status: Married  Tobacco Use   Smoking status: Former   Smokeless tobacco: Never  Vaping Use   Vaping status: Never Used  Substance and Sexual Activity   Alcohol use: Not Currently    Comment: Occasionally.   Drug use: Never   Sexual activity: Defer   Social Drivers of Health   Financial Resource Strain: Low Risk  (10/14/2024)   Overall Financial Resource Strain (CARDIA)    Difficulty of Paying Living Expenses: Not hard at all  Food Insecurity: No Food Insecurity (10/14/2024)   Hunger Vital Sign    Worried About Running Out of Food in the Last Year: Never true    Ran Out of Food in the Last Year: Never true  Transportation Needs: No Transportation Needs (10/14/2024)   PRAPARE - Administrator, Civil Service (Medical): No    Lack of Transportation (Non-Medical): No  Housing Stability: Low Risk  (10/14/2024)   Housing Stability Vital Sign    Unable to Pay for Housing in the Last Year: No    Number of Times Moved in the Last Year: 0    Homeless in the Last Year: No   Allergies:  Allergies  Allergen Reactions   Codeine Phosphate Other (See Comments)    Sick.   Morphine Itching and Other (See Comments)   Sulfa (Sulfonamide Antibiotics) Swelling and Other (See Comments)    Lip swells.   This note has been created using automated tools and reviewed for accuracy by ELIAS GREGORY RODRIGUEZ.  Attestation Statement:   I personally performed the service, non-incident to. (WP)   ELIAS CORDELLA STALLION, NP

## 2024-10-23 ENCOUNTER — Ambulatory Visit: Attending: Internal Medicine | Admitting: Occupational Therapy

## 2024-10-23 DIAGNOSIS — M6281 Muscle weakness (generalized): Secondary | ICD-10-CM | POA: Insufficient documentation

## 2024-10-23 NOTE — Therapy (Signed)
 " OUTPATIENT OCCUPATIONAL THERAPY NEURO EVALUATION  Patient Name: Belinda Soto MRN: [id number] DOB:10/04/1955, 69 y.o., female Today's Date: 10/24/2024   REFERRING PROVIDER: Sampson Ethridge DELENA, MD  END OF SESSION:  OT End of Session - 10/24/24 0001     Visit Number 1    Number of Visits 12    Date for Recertification  01/15/25    OT Start Time 1415    OT Stop Time 1500    OT Time Calculation (min) 45 min    Activity Tolerance Patient tolerated treatment well    Behavior During Therapy [facility] for tasks assessed/performed          Past Medical History:  Diagnosis Date   Arthritis    Depression    Diabetes mellitus without complication (HCC)    Dysrhythmia    palpitations   GERD (gastroesophageal reflux disease)    TUMS PRN   Headache    H/O MIGRAINES   History of kidney stones    Hypertension    Palpitations    Past Surgical History:  Procedure Laterality Date   c sections     COLONOSCOPY N/A 03/08/2015   Procedure: COLONOSCOPY;  Surgeon: Deward CINDERELLA Piedmont, MD;  Location: [facility] ENDOSCOPY;  Service: Gastroenterology;  Laterality: N/A;   COLONOSCOPY  02/2015   FISSURECTOMY N/A 08/24/2017   Procedure: FISSURECTOMY;  Surgeon: Dellie Louanne MATSU, MD;  Location: [facility] ORS;  Service: General;  Laterality: N/A;   FOOT SURGERY Left 2005   rotator cuff surgery Right 08/05/2021   SPHINCTEROTOMY N/A 08/24/2017   Procedure: SPHINCTEROTOMY;  Surgeon: Dellie Louanne MATSU, MD;  Location: [facility] ORS;  Service: General;  Laterality: N/A;   Patient Active Problem List   Diagnosis Date Noted   Hypomagnesemia 11/02/2023   Nausea vomiting and diarrhea 11/01/2023   Chest pain 11/01/2023   AKI (acute kidney injury) 11/01/2023   Elevated lactic acid level 11/01/2023   Reduced sense of smell 10/16/2020   Encounter for general adult medical examination with abnormal findings 02/08/2020   Psoriasis and similar disorder 02/08/2020   Dysuria 02/08/2020   Type 2 diabetes mellitus with  hyperglycemia (HCC) 06/06/2019   Varicose veins of both lower extremities with inflammation 06/06/2019   Acute nonintractable headache 05/12/2019   Neoplasm of uncertain behavior of skin of face 06/25/2018   Type 2 diabetes mellitus with diabetic neuropathy, with long-term current use of insulin  (HCC) 03/13/2018   Localized swelling, mass or lump of neck 01/13/2018   Gastroesophageal reflux disease without esophagitis 01/13/2018   Oral candidiasis 12/02/2017   Local superficial swelling, mass or lump 12/02/2017   Type II diabetes mellitus, uncontrolled 11/19/2017   Essential hypertension 11/19/2017   Elevated liver enzymes 02/01/2015    ONSET DATE: December 2025  REFERRING DIAG: CVA-acute right thalamic infarct, vascular Dementia, Vascular Parkinsonism  THERAPY DIAG:  Muscle weakness (generalized)  Rationale for Evaluation and Treatment: Rehabilitation  SUBJECTIVE:   SUBJECTIVE STATEMENT: Self Pt accompanied by: self; Daughter, Belinda Soto, works at [employer]  PERTINENT HISTORY: Pt. is a 69 y.o. female who was was referred for OT services 2/2 CVA-acute right thalamic infarct in December 2025, vascular Dementia, vascular Parkinsonsim.  Pt. has a history of bilateral shoulder surgeries.  Per chart review:  09/13/2024 MRI brain Without contrast IMPRESSION:  1. Abnormal brain MRI with substantially progressed since 2017 white matter  disease (including marginal corpus callosum involvement), and advanced signal  changes throughout the deep gray nuclei and pons. And suggestion of a  superimposed Acute punctate right  thalamic infarct. No hemorrhage or mass  effect.  2. Top differential include demyelinating conditions (both inflammatory and  metabolic related), vasculitis, and other accelerated small vessel disease.  Recommend Neurology consultation.  3. Associated cerebral volume loss with probable ex vacuo ventricular  enlargement (Evans index 0.29, callosal angle 91 degrees).     Return in about 6 months (around 04/13/2025) for Allyson Broody NP. This note has been created using automated tools and reviewed for accuracy by ELIAS GREGORY RODRIGUEZ.   History and Present Illness:    Belinda Soto is a right handed 69 y.o. female  here for evaluation of dementia, referred by Sampson Scales An*.   History of Present Illness Belinda Soto is a 69 year old female with diabetes, hypertension, and a history of stroke who presents with memory loss and cognitive decline. She is accompanied by her daughter, Belinda Soto, who is her primary caregiver.   Over the past year, she has experienced progressive memory loss and cognitive decline. Her daughter reports significant short-term memory issues, such as forgetting conversations within minutes, and difficulty following conversations. She has become withdrawn, moody, and prone to crying, with a noticeable decline in personal hygiene.   She has a history of a stroke, with an MRI on September 13, 2024, showing an acute right thalamic infarct. The MRI also revealed severe progression of white matter disease compared to 2017, with advanced T2 and FLAIR signal changes, focal cystic encephalomalacia near the corpus callosum, and extensive involvement of the deep gray nuclei, especially in the thalami. Additional brain stem signal abnormalities were noted.   Her past medical history includes diabetes, hypertension, migraines, and mitral valve disease. She has been experiencing issues with gait, including getting lost while driving, although there have been no wandering episodes or gait impairment noted. She does not engage in cooking, cleaning, or yard work, with her husband taking over these tasks.   She has a family history of Alzheimer's disease, with her grandmother having been diagnosed with it. She lives with her husband and receives primary caregiving from her daughter and spouse.   Her current medications include metformin  for diabetes,  losartan  for hypertension, rosuvastatin for cholesterol, and clopidogrel for stroke prevention. She is also on bupropion for depression. She is not taking aspirin , as she is on clopidogrel.   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 Since retirement, she has experienced increased inactivity and weight loss, dropping from 160 pounds in May 2023 to 125 pounds currently. She reports a lack of appetite and has been supplementing with nutritional drinks like Boost.     I reviewed labs, imaging, and notes in [place], [place], and from outside providers, if available.    Results Radiology Brain MRI (09/13/2024): Advanced microvascular ischemic changes, severe progression of white matter disease since 2017, focal cystic encephalomalacia near the corpus callosum, extensive deep gray nuclei involvement including thalami with advanced T2 and FLAIR signal abnormalities, additional brainstem signal changes, confluent periventricular white matter disease, prior infarcts, cortical sparing.    PRECAUTIONS: None  WEIGHT BEARING RESTRICTIONS: No  PAIN:  Are you having pain? No  FALLS: Has patient fallen in last 6 months? No  LIVING ENVIRONMENT: Lives with: lives with their spouse Lives in: House/apartment Stairs: None, one level home Has following equipment at home: Single point cane, grab bars  PLOF: Independent  PATIENT GOALS: To walk normal  OBJECTIVE:  Note: Objective measures were completed at Evaluation unless otherwise  noted.  HAND DOMINANCE: Right  ADLs:  Transfers/ambulation related to ADLs: Uses a cane, slow, shuffling  Eating: Independent Grooming: Independent  UB Dressing: Independent donning, difficulty removing shirt at times LB Dressing: Husband assists with LE dressing Toileting:  Independent Bathing: Husband assists to bathing tasks Tub Shower transfers:  Husband assist with getting in and out of  the shower   IADLs: Shopping: Husband performs all shopping tasks Light housekeeping: Pt. Is able to stand, and wash dishes. Pt. Reports that her husband assist with house cleaning, laundry Meal Prep:  Pt. Is able to fix light snacks, and beverages, however husband does the cooking Community mobility: Reports that she drove here today,  does drive at night Medication management: Pt. Husband, and daughter assist with medication management Financial management: Pt. Husband typically completes finances Handwriting: 75% legibility for name only; micorgraphia Cell phone management: Pt. Reports no changes Work History: Retired; Engineering geologist, and counted items. Leisure: Watching television, used to do crafts , and  flower gardening.    ACTIVITY TOLERANCE: Activity tolerance: Fair  FUNCTIONAL OUTCOME MEASURES: ADLs:  MAM-20 for Neuromuscular conditions: Date:     : Sum Score: 62/80 1= cannot do, 2=Very hard to do, 3=A little hard to do, 4=Easy to do   Rating Choose only one number (definitions above)  4 Cut nails with nail clippers  3 2. Tie shoes with laces  2 3. Cut meat on a plate  4 4. Wring a towel  2 5. Open a medicine bottle with a child proof cap/top  3 6. Zip jacket  3 7. Button clothes (medium sized buttons)  2 8. Write 3-4 lines legibly (dominant hand)  2 9. Take things/cards out of a wallet   4 10. Open a wide mouth jar/bottle previously opened  2 11. Handle/count money (bills and coins)  2 12. Pick up 1/2 full water pitcher  2 13. Turn key (to open a door)  3 14. Squeeze toothpaste onto a toothbrush  4 15. Use a spoon or fork  4 16. Brush or comb hair   4 17. Dial or key in telephone numbers  4 18. Brush teeth (R hand dominant)  4 19. Wash hands  4 20. Use hand(s) to eat a sandwich   62/80  Total Sum Score     UPPER EXTREMITY ROM:    Active ROM Right eval Left eval  Shoulder flexion 130(140) 135(140)  Shoulder abduction 103(110) 143  Shoulder  adduction    Shoulder extension    Shoulder internal rotation    Shoulder external rotation    Elbow flexion [facility] WFL  Elbow extension [facility]  Wrist flexion [facility] WFL  Wrist extension [facility] WFL  Wrist ulnar deviation    Wrist radial deviation    Wrist pronation [facility] WFL  Wrist supination WFL WFL  (Blank rows = not tested)  UPPER EXTREMITY MMT:     MMT Right Eval 4/5 overall within available ROM Left Eval 4/5 overall within available ROM  Shoulder flexion    Shoulder abduction    Shoulder adduction    Shoulder extension    Shoulder internal rotation    Shoulder external rotation    Middle trapezius    Lower trapezius    Elbow flexion    Elbow extension    Wrist flexion    Wrist extension    Wrist ulnar deviation    Wrist radial deviation    Wrist pronation    Wrist supination    (  Blank rows = not tested)  HAND FUNCTION: Grip strength: Right: 35 lbs; Left: 30 lbs, Lateral pinch: Right: 8 lbs, Left: 8 lbs, and 3 point pinch: Right: 7 lbs, Left: 5 lbs  COORDINATION: 9 Hole Peg test: Right: 37 sec; Left: 53 sec  SENSATION: WFL  EDEMA: N/A    COGNITION: Overall cognitive status: Within functional limits for tasks assessed and History of cognitive impairments - at baseline  VISION: Subjective report: Reports no changes  Continue to assess within functional context   PRAXIS: WFL   TREATMENT DATE: 10/23/24:  OT initial evaluation was completed, and Pt. education was provided as indicated below.      PATIENT EDUCATION: Education details: OT services, POC, goals and ADL/IADL functional Status.  Person educated: Patient, Daughter Education method: Explanation, Actor cues, and Verbal cues Education comprehension: verbalized understanding, returned demonstration, and needs further education  HOME EXERCISE PROGRAM:  Continue to assess ongoing need for HEPs, and provide/upgrade as indicated.    GOALS: Goals reviewed with patient? Yes  SHORT TERM GOALS:  Target date: 12/04/2024      Pt. Will be independent with HEPs for the BUEs. Baseline: Eval: No current HEP Goal status: INITIAL   LONG TERM GOALS: Target date: 01/15/2025  Pt. will increase bilateral UE strength by 2 mm grades to assist with ADLs, and IADLs. Baseline: Eval: BUE strength: 4/5 overall within available range  Goal status: INITIAL  2.  Pt. Will improve left grip strength by 3# to be able securely hold ADL items in her hands Baseline: Eval: Right: 35#, Left: 30# Goal status: INITIAL  3.  Pt. Will improve bilateral [facility] skills by 3 sec. Of speed to be able to manipulate small objects. Baseline: Eval: Right: 37 sec., Left: 53 sec. Goal status: INITIAL  4.  Pt. Will perform LE dressing tasks with supervision Baseline: Eval: Pt. Requires assist from her husband for LE ADLs. Goal status: INITIAL  5.  Pt. Will perform simulated home management tasks with modified independence with no LOB Baseline: Eval: Pt. engages in limited dish washing tasks at home. Pt. Reports her husband completes all other IADLs for her. Goal status: INITIAL  6.  Pt. Will write one sentence with 100% legibility in preparation for preparing written correspondence Baseline: Eval: Name only 75% legibility with microgrphia Goal status: INITIAL  ASSESSMENT:  CLINICAL IMPRESSION:  Patient is a 69 y.o. female who was seen today for occupational therapy evaluation for CVA, vascular dementia, and vascular parkinsonism.  Pt. Presents with limited bilateral shoulder ROM from previous bilateral shoulder surgeries, BUE weakness, decreased left grip strength, impaired bilateral [facility] skills, micrographia, and limited balance which affect her ability to complete, LE ADLs, home management tasks, meal preparation tasks, button clothes, write legibly, securely hold items, and manipulate small objects. MAM-20 score is 62/80. Pt. Will benefit from OT services to work on improving these ADL/IADL tasks, and UE functioning  in order to maximize overall independence.   PERFORMANCE DEFICITS: in functional skills including ADLs, IADLs, coordination, dexterity, proprioception, ROM, strength, Fine motor control, Gross motor control, decreased knowledge of use of DME, and UE functional use, cognitive skills including memory and problem solving, and psychosocial skills including coping strategies, environmental adaptation, interpersonal interactions, and routines and behaviors.   IMPAIRMENTS: are limiting patient from ADLs, IADLs, and leisure.   CO-MORBIDITIES: may have co-morbidities  that affects occupational performance. Patient will benefit from skilled OT to address above impairments and improve overall function.  MODIFICATION OR ASSISTANCE TO COMPLETE EVALUATION: Min-Moderate  modification of tasks or assist with assess necessary to complete an evaluation.  OT OCCUPATIONAL PROFILE AND HISTORY: Detailed assessment: Review of records and additional review of physical, cognitive, psychosocial history related to current functional performance.  CLINICAL DECISION MAKING: Moderate - several treatment options, min-mod task modification necessary  REHAB POTENTIAL: Good  EVALUATION COMPLEXITY: Moderate    PLAN:  OT FREQUENCY: 1-2x/week  OT DURATION: 12 weeks  PLANNED INTERVENTIONS: 97168 OT Re-evaluation, 97535 self care/ADL training, 02889 therapeutic exercise, 97530 therapeutic activity, 97112 neuromuscular re-education, 97140 manual therapy, 97010 moist heat, 97034 contrast bath, 97129 Cognitive training (first 15 min), 02869 Cognitive training(each additional 15 min), 02239 Orthotic Initial, 97763 Orthotic/Prosthetic subsequent, functional mobility training, patient/family education, and DME and/or AE instructions  RECOMMENDED OTHER SERVICES: ST &PT: Request for orders submitted to the Physician 10/23/24  CONSULTED AND AGREED WITH PLAN OF CARE: Patient  PLAN FOR NEXT SESSION: Treatment  Zuma Hust, MS,  OTR/L  10/24/2024, 1:05 AM           "

## 2024-10-24 ENCOUNTER — Ambulatory Visit
Admission: RE | Admit: 2024-10-24 | Discharge: 2024-10-24 | Disposition: A | Payer: PRIVATE HEALTH INSURANCE | Source: Ambulatory Visit | Attending: Internal Medicine

## 2024-10-24 DIAGNOSIS — Z780 Asymptomatic menopausal state: Secondary | ICD-10-CM | POA: Insufficient documentation

## 2024-10-24 DIAGNOSIS — Z1231 Encounter for screening mammogram for malignant neoplasm of breast: Secondary | ICD-10-CM | POA: Diagnosis present

## 2024-10-30 ENCOUNTER — Ambulatory Visit: Admitting: Occupational Therapy

## 2024-11-04 ENCOUNTER — Ambulatory Visit

## 2024-11-06 ENCOUNTER — Ambulatory Visit: Admitting: Occupational Therapy

## 2024-11-11 ENCOUNTER — Ambulatory Visit

## 2024-11-11 ENCOUNTER — Ambulatory Visit: Admitting: Speech Pathology

## 2024-11-13 ENCOUNTER — Ambulatory Visit

## 2024-11-18 ENCOUNTER — Ambulatory Visit
